# Patient Record
Sex: Female | Born: 1960 | Race: Black or African American | Hispanic: No | Smoking: Former smoker
Health system: Southern US, Community
[De-identification: ages and names within clinical notes are randomized; demographics above are authoritative.]

## PROBLEM LIST (undated history)

## (undated) DIAGNOSIS — J309 Allergic rhinitis, unspecified: Secondary | ICD-10-CM

## (undated) DIAGNOSIS — F32A Depression, unspecified: Secondary | ICD-10-CM

## (undated) DIAGNOSIS — E785 Hyperlipidemia, unspecified: Secondary | ICD-10-CM

## (undated) DIAGNOSIS — B9689 Other specified bacterial agents as the cause of diseases classified elsewhere: Secondary | ICD-10-CM

## (undated) DIAGNOSIS — I10 Essential (primary) hypertension: Secondary | ICD-10-CM

## (undated) DIAGNOSIS — F1210 Cannabis abuse, uncomplicated: Secondary | ICD-10-CM

## (undated) DIAGNOSIS — N760 Acute vaginitis: Secondary | ICD-10-CM

## (undated) DIAGNOSIS — F4312 Post-traumatic stress disorder, chronic: Secondary | ICD-10-CM

## (undated) DIAGNOSIS — K219 Gastro-esophageal reflux disease without esophagitis: Secondary | ICD-10-CM

## (undated) DIAGNOSIS — F1410 Cocaine abuse, uncomplicated: Secondary | ICD-10-CM

## (undated) DIAGNOSIS — F1010 Alcohol abuse, uncomplicated: Secondary | ICD-10-CM

## (undated) DIAGNOSIS — D259 Leiomyoma of uterus, unspecified: Secondary | ICD-10-CM

## (undated) DIAGNOSIS — F419 Anxiety disorder, unspecified: Secondary | ICD-10-CM

## (undated) DIAGNOSIS — F329 Major depressive disorder, single episode, unspecified: Secondary | ICD-10-CM

## (undated) HISTORY — PX: TYMPANOSTOMY TUBE PLACEMENT: SHX32

## (undated) HISTORY — DX: Post-traumatic stress disorder, chronic: F43.12

## (undated) HISTORY — DX: Gastro-esophageal reflux disease without esophagitis: K21.9

## (undated) HISTORY — DX: Allergic rhinitis, unspecified: J30.9

## (undated) HISTORY — DX: Hyperlipidemia, unspecified: E78.5

## (undated) HISTORY — DX: Other specified bacterial agents as the cause of diseases classified elsewhere: B96.89

## (undated) HISTORY — DX: Leiomyoma of uterus, unspecified: D25.9

## (undated) HISTORY — DX: Anxiety disorder, unspecified: F41.9

## (undated) HISTORY — DX: Major depressive disorder, single episode, unspecified: F32.9

## (undated) HISTORY — PX: CHOLECYSTECTOMY: SHX55

## (undated) HISTORY — PX: OTHER SURGICAL HISTORY: SHX169

## (undated) HISTORY — PX: TOE SURGERY: SHX1073

## (undated) HISTORY — DX: Depression, unspecified: F32.A

## (undated) HISTORY — DX: Acute vaginitis: N76.0

## (undated) HISTORY — PX: TONSILLECTOMY: SUR1361

## (undated) HISTORY — PX: ABDOMINAL HYSTERECTOMY: SHX81

---

## 2005-12-09 ENCOUNTER — Emergency Department (HOSPITAL_COMMUNITY): Admission: EM | Admit: 2005-12-09 | Discharge: 2005-12-09 | Payer: Self-pay | Admitting: Family Medicine

## 2006-12-06 ENCOUNTER — Emergency Department (HOSPITAL_COMMUNITY): Admission: EM | Admit: 2006-12-06 | Discharge: 2006-12-06 | Payer: Self-pay | Admitting: Emergency Medicine

## 2006-12-29 ENCOUNTER — Emergency Department (HOSPITAL_COMMUNITY): Admission: EM | Admit: 2006-12-29 | Discharge: 2006-12-29 | Payer: Self-pay | Admitting: Family Medicine

## 2007-07-18 ENCOUNTER — Encounter: Admission: RE | Admit: 2007-07-18 | Discharge: 2007-07-18 | Payer: Self-pay | Admitting: Family Medicine

## 2007-09-12 ENCOUNTER — Ambulatory Visit (HOSPITAL_COMMUNITY): Admission: RE | Admit: 2007-09-12 | Discharge: 2007-09-12 | Payer: Self-pay | Admitting: Obstetrics

## 2008-07-12 ENCOUNTER — Emergency Department (HOSPITAL_COMMUNITY): Admission: EM | Admit: 2008-07-12 | Discharge: 2008-07-12 | Payer: Self-pay | Admitting: Family Medicine

## 2008-10-26 ENCOUNTER — Emergency Department (HOSPITAL_COMMUNITY): Admission: EM | Admit: 2008-10-26 | Discharge: 2008-10-27 | Payer: Self-pay | Admitting: Emergency Medicine

## 2009-01-02 ENCOUNTER — Emergency Department (HOSPITAL_COMMUNITY): Admission: EM | Admit: 2009-01-02 | Discharge: 2009-01-02 | Payer: Self-pay | Admitting: Emergency Medicine

## 2009-01-23 ENCOUNTER — Other Ambulatory Visit: Payer: Self-pay

## 2009-01-23 ENCOUNTER — Other Ambulatory Visit: Payer: Self-pay | Admitting: Emergency Medicine

## 2009-01-24 ENCOUNTER — Inpatient Hospital Stay (HOSPITAL_COMMUNITY): Admission: RE | Admit: 2009-01-24 | Discharge: 2009-01-30 | Payer: Self-pay | Admitting: Psychiatry

## 2009-01-24 ENCOUNTER — Ambulatory Visit: Payer: Self-pay | Admitting: Psychiatry

## 2009-05-10 ENCOUNTER — Emergency Department (HOSPITAL_COMMUNITY): Admission: EM | Admit: 2009-05-10 | Discharge: 2009-05-10 | Payer: Self-pay | Admitting: Emergency Medicine

## 2009-05-30 ENCOUNTER — Emergency Department (HOSPITAL_COMMUNITY): Admission: EM | Admit: 2009-05-30 | Discharge: 2009-05-30 | Payer: Self-pay | Admitting: Emergency Medicine

## 2009-09-02 ENCOUNTER — Observation Stay (HOSPITAL_COMMUNITY): Admission: EM | Admit: 2009-09-02 | Discharge: 2009-09-03 | Payer: Self-pay | Admitting: Emergency Medicine

## 2010-03-22 ENCOUNTER — Encounter: Payer: Self-pay | Admitting: Family Medicine

## 2010-03-23 ENCOUNTER — Encounter: Payer: Self-pay | Admitting: Obstetrics

## 2010-04-07 ENCOUNTER — Other Ambulatory Visit (HOSPITAL_COMMUNITY): Payer: Self-pay | Admitting: Obstetrics

## 2010-04-07 DIAGNOSIS — Z1231 Encounter for screening mammogram for malignant neoplasm of breast: Secondary | ICD-10-CM

## 2010-04-07 DIAGNOSIS — Z9289 Personal history of other medical treatment: Secondary | ICD-10-CM

## 2010-04-09 LAB — PROCEDURE REPORT - SCANNED: Pap: NEGATIVE

## 2010-04-23 ENCOUNTER — Ambulatory Visit (HOSPITAL_COMMUNITY): Admission: RE | Admit: 2010-04-23 | Payer: Medicaid Other | Source: Ambulatory Visit

## 2010-05-04 ENCOUNTER — Encounter (HOSPITAL_COMMUNITY)
Admission: RE | Admit: 2010-05-04 | Discharge: 2010-05-04 | Disposition: A | Discharge status: Home / Self Care | Payer: Medicaid Other | Source: Ambulatory Visit | Attending: Obstetrics | Admitting: Obstetrics

## 2010-05-04 DIAGNOSIS — Z01818 Encounter for other preprocedural examination: Secondary | ICD-10-CM | POA: Insufficient documentation

## 2010-05-04 DIAGNOSIS — Z01812 Encounter for preprocedural laboratory examination: Secondary | ICD-10-CM | POA: Insufficient documentation

## 2010-05-04 LAB — BASIC METABOLIC PANEL
BUN: 12 mg/dL (ref 6–23)
Creatinine, Ser: 0.43 mg/dL (ref 0.4–1.2)
GFR calc Af Amer: >60 mL/min (ref >60)
GFR calc non Af Amer: >60 mL/min (ref >60)
Potassium: 4.1 mEq/L (ref 3.5–5.1)

## 2010-05-04 LAB — CBC
MCV: 80.2 fL (ref 78.0–100.0)
Platelets: 358 10*3/uL (ref 150–400)
RDW: 27.8 % — ABNORMAL HIGH (ref 11.5–15.5)
WBC: 9.3 10*3/uL (ref 4.0–10.5)

## 2010-05-04 LAB — SURGICAL PCR SCREEN
MRSA, PCR: NEGATIVE
Staphylococcus aureus: NEGATIVE

## 2010-05-06 ENCOUNTER — Other Ambulatory Visit: Payer: Self-pay | Admitting: Obstetrics

## 2010-05-06 ENCOUNTER — Inpatient Hospital Stay (HOSPITAL_COMMUNITY)
Admission: RE | Admit: 2010-05-06 | Discharge: 2010-05-09 | DRG: 743 | Disposition: A | Discharge status: Home / Self Care | Payer: Medicaid Other | Source: Ambulatory Visit | Attending: Obstetrics | Admitting: Obstetrics

## 2010-05-06 DIAGNOSIS — N841 Polyp of cervix uteri: Secondary | ICD-10-CM | POA: Diagnosis present

## 2010-05-06 DIAGNOSIS — D252 Subserosal leiomyoma of uterus: Principal | ICD-10-CM | POA: Diagnosis present

## 2010-05-06 DIAGNOSIS — N838 Other noninflammatory disorders of ovary, fallopian tube and broad ligament: Secondary | ICD-10-CM | POA: Diagnosis present

## 2010-05-07 LAB — CBC
HCT: 32.3 % — ABNORMAL LOW (ref 36.0–46.0)
Hemoglobin: 10.2 g/dL — ABNORMAL LOW (ref 12.0–15.0)
MCH: 24.3 pg — ABNORMAL LOW (ref 26.0–34.0)
MCHC: 31.6 g/dL (ref 30.0–36.0)
MCV: 77.1 fL — ABNORMAL LOW (ref 78.0–100.0)
Platelets: 302 10*3/uL (ref 150–400)
RBC: 4.19 MIL/uL (ref 3.87–5.11)
WBC: 14.9 10*3/uL — ABNORMAL HIGH (ref 4.0–10.5)

## 2010-05-13 NOTE — Discharge Summary (Signed)
  Tonya Mckenzie, Tonya Mckenzie                  ACCOUNT NO.:  [id number]  MEDICAL RECORD NO.:  [id number]           PATIENT TYPE:  I  LOCATION:  9308                          FACILITY:  [facility]  PHYSICIAN:  Kathreen Cosier, M.D.DATE OF BIRTH:  1960/05/21  DATE OF ADMISSION:  05/06/2010 DATE OF DISCHARGE:  05/09/2010                              DISCHARGE SUMMARY   She is a 50 year old, gravida 4, para 4 with long history of myoma uteri and was admitted for TAH.  She takes clonidine 0.15 for pressure and the patient has depression from the past 2 years, however, she has not been taking any medication.  She witnessed the death of her husband and since then has been in denial.  She has an appointment for mental health.  She underwent TAH and bilateral salpingectomy on April 07, 2010.  Postop, she did well.  Her hemoglobin on admission was 12.4, postop 10.2, sodium 142, potassium 4.1, chloride 106, glucose 97, BUN 12, RPR negative.  She was discharged on the third postoperative day, ambulatory, on a regular diet, to see me in 4 weeks.  DISCHARGE DIAGNOSIS:  Status post total abdominal hysterectomy, bilateral salpingectomy.  DISCHARGE MEDICATIONS:  Zoloft 100 mg p.o. daily and Tylox for pain.          ______________________________ Kathreen Cosier, M.D.     BAM/MEDQ  D:  05/09/2010  T:  05/09/2010  Job:  [id number]  Electronically Signed by Francoise Ceo M.D. on 05/13/2010 07:14:07 AM

## 2010-05-13 NOTE — Op Note (Signed)
  Tonya Mckenzie, Tonya Mckenzie                  ACCOUNT NO.:  [id number]  MEDICAL RECORD NO.:  [id number]           PATIENT TYPE:  I  LOCATION:  9308                          FACILITY:  [facility]  PHYSICIAN:  Kathreen Cosier, M.D.DATE OF BIRTH:  1960-04-23  DATE OF PROCEDURE:  05/06/2010 DATE OF DISCHARGE:                              OPERATIVE REPORT   PREOPERATIVE DIAGNOSIS:  Myoma uteri.  POSTOPERATIVE DIAGNOSIS:  Myoma uteri.  SURGEON:  Kathreen Cosier, MD  FIRST ASSISTANT:  Charles A. Clearance Coots, MD  PROCEDURE:  The patient was placed on the operating table in supine position and general anesthesia administered, abdomen prepped and draped, bladder emptied with Foley catheter.  Transverse suprapubic incision was made, carried down to rectus fascia.  Fascia cleaned and incised to the length of incision.  Recti muscles were retracted laterally.  Peritoneum incised longitudinally.  She had 18-week size uterus and the uterus was delivered through the incision.  Right round ligament was grasped with a Kelly clamp, cut, suture ligated with #1 chromic.  Procedure was done in a similar fashion on the other side. Using the Metzenbaum, bladder flap was developed and the bladder was dissected off the cervix and uterus.  The right utero-ovarian ligament was grasped with Kelly clamp, cut, and suture ligated with #1 chromic. Procedure was done in a similar fashion on the other side.  Uterine vessels were skeletonized bilaterally, double clamped with Heaney clamps on the right, cut, suture ligated x2 with #1 chromic.  Procedure was done in a similar fashion on the other side.  The right uterosacral and cardinal ligaments were grasped with straight Kocher clamp, cut, suture ligated with #1 chromic.  Procedure was done in a similar fashion on the other side.  Specimen consisting of uterus and cervix were removed with the cervicovaginal junction with Mayo scissors.  Modified Richardson sutures were  placed in the angles of the vagina and the vaginal vault run with interlocking suture of #1 chromic.  Hemostasis was satisfactory.  The left tube was grasped with Kelly clamp, cut, suture ligated with #1 chromic.  Procedure was done in a similar fashion on the other side.  The ovaries were normal.  Operative site reperitonealized with 2-0 chromic.  Lap and sponge counts were correct.  Blood loss 250 mL.  Abdomen closed in layers, peritoneum with continuous suture of 0 chromic, fascia with continuous suture with Dexon, skin closed with subcuticular stitch of 4-0 Monocryl.  The patient tolerated the procedure well, taken to recovery room in good condition.          ______________________________ Kathreen Cosier, M.D.     BAM/MEDQ  D:  05/06/2010  T:  05/06/2010  Job:  [id number]  Electronically Signed by Francoise Ceo M.D. on 05/13/2010 07:14:05 AM

## 2010-05-20 LAB — DIFFERENTIAL
Eosinophils Relative: 2 % (ref 0–5)
Monocytes Absolute: 0.7 10*3/uL (ref 0.1–1.0)
Neutrophils Relative %: 68 % (ref 43–77)

## 2010-05-20 LAB — CBC
HCT: 33.7 % — ABNORMAL LOW (ref 36.0–46.0)
Hemoglobin: 10.9 g/dL — ABNORMAL LOW (ref 12.0–15.0)
MCHC: 32.4 g/dL (ref 30.0–36.0)
MCV: 79.1 fL (ref 78.0–100.0)
RDW: 17.4 % — ABNORMAL HIGH (ref 11.5–15.5)

## 2010-05-20 LAB — URINALYSIS, ROUTINE W REFLEX MICROSCOPIC
Nitrite: NEGATIVE
Protein, ur: NEGATIVE mg/dL
Urobilinogen, UA: 0.2 mg/dL (ref 0.0–1.0)

## 2010-05-20 LAB — COMPREHENSIVE METABOLIC PANEL
Alkaline Phosphatase: 81 U/L (ref 39–117)
BUN: 9 mg/dL (ref 6–23)
Calcium: 9.1 mg/dL (ref 8.4–10.5)
Creatinine, Ser: 0.98 mg/dL (ref 0.4–1.2)
Glucose, Bld: 104 mg/dL — ABNORMAL HIGH (ref 70–99)
Potassium: 4.1 mEq/L (ref 3.5–5.1)
Total Protein: 6.8 g/dL (ref 6.0–8.3)

## 2010-05-20 LAB — WET PREP, GENITAL
Trich, Wet Prep: NONE SEEN
Yeast Wet Prep HPF POC: NONE SEEN

## 2010-05-20 LAB — URINE MICROSCOPIC-ADD ON

## 2010-05-20 LAB — GC/CHLAMYDIA PROBE AMP, GENITAL: GC Probe Amp, Genital: NEGATIVE

## 2010-05-25 LAB — CBC
Platelets: 364 10*3/uL (ref 150–400)
RDW: 16.8 % — ABNORMAL HIGH (ref 11.5–15.5)

## 2010-05-25 LAB — DIFFERENTIAL
Lymphocytes Relative: 18 % (ref 12–46)
Lymphs Abs: 1.8 10*3/uL (ref 0.7–4.0)
Monocytes Relative: 6 % (ref 3–12)
Neutro Abs: 7.4 10*3/uL (ref 1.7–7.7)
Neutrophils Relative %: 74 % (ref 43–77)

## 2010-05-25 LAB — RAPID URINE DRUG SCREEN, HOSP PERFORMED
Benzodiazepines: NOT DETECTED
Cocaine: POSITIVE — AB
Tetrahydrocannabinol: POSITIVE — AB

## 2010-05-25 LAB — BASIC METABOLIC PANEL
BUN: 11 mg/dL (ref 6–23)
Calcium: 8.8 mg/dL (ref 8.4–10.5)
Creatinine, Ser: 0.99 mg/dL (ref 0.4–1.2)
GFR calc non Af Amer: >60 mL/min (ref >60)
Glucose, Bld: 99 mg/dL (ref 70–99)

## 2010-05-25 LAB — PREGNANCY, URINE: Preg Test, Ur: NEGATIVE

## 2010-06-03 LAB — DIFFERENTIAL
Basophils Absolute: 0 10*3/uL (ref 0.0–0.1)
Basophils Absolute: 0.1 10*3/uL (ref 0.0–0.1)
Basophils Relative: 0 % (ref 0–1)
Eosinophils Absolute: 0.2 10*3/uL (ref 0.0–0.7)
Lymphs Abs: 2.8 10*3/uL (ref 0.7–4.0)
Monocytes Absolute: 0.7 10*3/uL (ref 0.1–1.0)
Neutro Abs: 6.9 10*3/uL (ref 1.7–7.7)
Neutrophils Relative %: 61 % (ref 43–77)

## 2010-06-03 LAB — URINALYSIS, ROUTINE W REFLEX MICROSCOPIC
Glucose, UA: NEGATIVE mg/dL
Ketones, ur: NEGATIVE mg/dL
Leukocytes, UA: NEGATIVE
Protein, ur: NEGATIVE mg/dL
Urobilinogen, UA: 0.2 mg/dL (ref 0.0–1.0)
pH: 6 (ref 5.0–8.0)

## 2010-06-03 LAB — CBC
HCT: 33.6 % — ABNORMAL LOW (ref 36.0–46.0)
Hemoglobin: 11 g/dL — ABNORMAL LOW (ref 12.0–15.0)
MCV: 85.7 fL (ref 78.0–100.0)
Platelets: 301 10*3/uL (ref 150–400)
Platelets: 304 10*3/uL (ref 150–400)
RBC: 4.01 MIL/uL (ref 3.87–5.11)
WBC: 10.3 10*3/uL (ref 4.0–10.5)
WBC: 10.4 10*3/uL (ref 4.0–10.5)

## 2010-06-03 LAB — COMPREHENSIVE METABOLIC PANEL
Albumin: 3.8 g/dL (ref 3.5–5.2)
Alkaline Phosphatase: 92 U/L (ref 39–117)
BUN: 16 mg/dL (ref 6–23)
CO2: 23 mEq/L (ref 19–32)
Chloride: 106 mEq/L (ref 96–112)
GFR calc non Af Amer: >60 mL/min (ref >60)
Glucose, Bld: 94 mg/dL (ref 70–99)
Potassium: 3.6 mEq/L (ref 3.5–5.1)
Total Bilirubin: 0.5 mg/dL (ref 0.3–1.2)

## 2010-06-03 LAB — URINE MICROSCOPIC-ADD ON

## 2010-06-03 LAB — BASIC METABOLIC PANEL
BUN: 16 mg/dL (ref 6–23)
Chloride: 109 mEq/L (ref 96–112)
Creatinine, Ser: 1.14 mg/dL (ref 0.4–1.2)
GFR calc non Af Amer: 51 mL/min — ABNORMAL LOW (ref >60)

## 2010-06-03 LAB — RAPID URINE DRUG SCREEN, HOSP PERFORMED
Amphetamines: NOT DETECTED
Barbiturates: NOT DETECTED
Cocaine: POSITIVE — AB
Tetrahydrocannabinol: POSITIVE — AB

## 2010-06-03 LAB — ETHANOL
Alcohol, Ethyl (B): <5 mg/dL (ref 0–10)
Alcohol, Ethyl (B): <5 mg/dL (ref 0–10)

## 2010-06-03 LAB — TRICYCLICS SCREEN, URINE: TCA Scrn: NOT DETECTED

## 2010-12-09 LAB — WET PREP, GENITAL: Yeast Wet Prep HPF POC: NONE SEEN

## 2010-12-09 LAB — POCT URINALYSIS DIP (DEVICE)
Bilirubin Urine: NEGATIVE
Nitrite: NEGATIVE
Protein, ur: NEGATIVE
Urobilinogen, UA: 0.2
pH: 6

## 2010-12-09 LAB — URINE CULTURE: Culture: NO GROWTH

## 2010-12-09 LAB — POCT PREGNANCY, URINE
Operator id: 239701
Preg Test, Ur: NEGATIVE

## 2010-12-10 LAB — URINE MICROSCOPIC-ADD ON

## 2010-12-10 LAB — URINALYSIS, ROUTINE W REFLEX MICROSCOPIC
Bilirubin Urine: NEGATIVE
Ketones, ur: NEGATIVE
Protein, ur: NEGATIVE
Specific Gravity, Urine: 1.028
Urobilinogen, UA: 0.2

## 2010-12-10 LAB — I-STAT 8, (EC8 V) (CONVERTED LAB)
Acid-Base Excess: 1
Chloride: 108
Glucose, Bld: 94
pCO2, Ven: 44.2 — ABNORMAL LOW
pH, Ven: 7.382 — ABNORMAL HIGH

## 2010-12-10 LAB — POCT PREGNANCY, URINE
Operator id: 285841
Preg Test, Ur: NEGATIVE

## 2010-12-10 LAB — RAPID URINE DRUG SCREEN, HOSP PERFORMED
Amphetamines: NOT DETECTED
Barbiturates: NOT DETECTED
Benzodiazepines: NOT DETECTED
Cocaine: POSITIVE — AB
Opiates: NOT DETECTED

## 2010-12-10 LAB — POCT I-STAT CREATININE
Creatinine, Ser: 1.1
Operator id: 285841

## 2010-12-10 LAB — ETHANOL: Alcohol, Ethyl (B): <5

## 2010-12-10 LAB — DIFFERENTIAL
Eosinophils Relative: 2
Lymphocytes Relative: 22
Lymphs Abs: 2

## 2010-12-10 LAB — CBC
HCT: 39.6
Platelets: 320
RBC: 4.48
WBC: 9

## 2011-01-07 ENCOUNTER — Ambulatory Visit (HOSPITAL_COMMUNITY): Payer: Medicaid Other

## 2011-02-10 ENCOUNTER — Ambulatory Visit (HOSPITAL_COMMUNITY): Payer: Medicaid Other

## 2011-02-24 ENCOUNTER — Ambulatory Visit (HOSPITAL_COMMUNITY): Payer: Medicaid Other | Attending: Obstetrics

## 2011-05-13 ENCOUNTER — Other Ambulatory Visit: Payer: Self-pay | Admitting: Internal Medicine

## 2011-05-13 DIAGNOSIS — N63 Unspecified lump in unspecified breast: Secondary | ICD-10-CM

## 2011-05-20 ENCOUNTER — Other Ambulatory Visit: Payer: Medicaid Other

## 2011-05-25 ENCOUNTER — Other Ambulatory Visit: Payer: Medicaid Other

## 2011-06-01 ENCOUNTER — Other Ambulatory Visit: Payer: Self-pay | Admitting: Internal Medicine

## 2011-06-01 DIAGNOSIS — Z1231 Encounter for screening mammogram for malignant neoplasm of breast: Secondary | ICD-10-CM

## 2011-06-08 ENCOUNTER — Ambulatory Visit: Payer: Medicaid Other

## 2011-06-15 ENCOUNTER — Ambulatory Visit
Admission: RE | Admit: 2011-06-15 | Discharge: 2011-06-15 | Disposition: A | Discharge status: Home / Self Care | Payer: Medicaid Other | Source: Ambulatory Visit | Attending: Internal Medicine | Admitting: Internal Medicine

## 2011-06-15 DIAGNOSIS — Z1231 Encounter for screening mammogram for malignant neoplasm of breast: Secondary | ICD-10-CM

## 2011-11-08 ENCOUNTER — Emergency Department (INDEPENDENT_AMBULATORY_CARE_PROVIDER_SITE_OTHER): Payer: Medicaid Other

## 2011-11-08 ENCOUNTER — Encounter (HOSPITAL_COMMUNITY): Payer: Self-pay | Admitting: *Deleted

## 2011-11-08 ENCOUNTER — Emergency Department (INDEPENDENT_AMBULATORY_CARE_PROVIDER_SITE_OTHER)
Admission: EM | Admit: 2011-11-08 | Discharge: 2011-11-08 | Disposition: A | Discharge status: Home / Self Care | Payer: Medicaid Other | Source: Home / Self Care | Attending: Family Medicine | Admitting: Family Medicine

## 2011-11-08 DIAGNOSIS — S93409A Sprain of unspecified ligament of unspecified ankle, initial encounter: Secondary | ICD-10-CM

## 2011-11-08 HISTORY — DX: Essential (primary) hypertension: I10

## 2011-11-08 MED ORDER — DICLOFENAC POTASSIUM 50 MG PO TABS: 50.0000 mg | ORAL_TABLET | Freq: Three times a day (TID) | ORAL | Status: DC

## 2011-11-08 NOTE — ED Provider Notes (Signed)
History     CSN: [id number]  Arrival date & time 11/08/11  1556   First MD Initiated Contact with Patient 11/08/11 1602      No chief complaint on file.   (Consider location/radiation/quality/duration/timing/severity/associated sxs/prior treatment) Patient is a 51 y.o. female presenting with ankle pain. The history is provided by the patient.  Ankle Pain  The incident occurred 2 days ago. The incident occurred at home. The injury mechanism was a fall (stepped on a rock and ankle rolled, no pain on sun, however worse today.). The pain is present in the left ankle. The quality of the pain is described as throbbing. The pain is mild. She reports no foreign bodies present.    No past medical history on file.  No past surgical history on file.  No family history on file.  History  Substance Use Topics  . Smoking status: Not on file  . Smokeless tobacco: Not on file  . Alcohol Use: Not on file    OB History    No data available      Review of Systems  Constitutional: Negative.   Gastrointestinal: Negative.   Musculoskeletal: Positive for joint swelling and gait problem.    Allergies  Review of patient's allergies indicates not on file.  Home Medications  No current outpatient prescriptions on file.  BP 147/102  Pulse 86  Temp 98.2 F (36.8 C) (Oral)  Resp 21  SpO2 100%  Physical Exam  Nursing note and vitals reviewed. Constitutional: She is oriented to person, place, and time. She appears well-developed and well-nourished.  Musculoskeletal: She exhibits tenderness.       Left ankle: She exhibits decreased range of motion and swelling. She exhibits no ecchymosis and no deformity. tenderness. Lateral malleolus, AITFL and CF ligament tenderness found. No medial malleolus, no posterior TFL, no head of 5th metatarsal and no proximal fibula tenderness found. Achilles tendon normal.  Neurological: She is alert and oriented to person, place, and time.  Skin: Skin is warm  and dry.  Psychiatric: She has a normal mood and affect.    ED Course  Procedures (including critical care time)  Labs Reviewed - No data to display No results found.   No diagnosis found.    MDM  X-rays reviewed and report per radiologist.       Linna Hoff, MD 11/12/11 [phone]

## 2011-11-08 NOTE — ED Notes (Signed)
Pt  Reports  Several  Days  Ago  She  Injured  Her  l  Foot/  Ankle  Area    She  Has  Been  Walking in the  Affected  Foot      And  Now  Has  Pain  And  Swelling  Worse  On  Weight bearing

## 2012-05-12 ENCOUNTER — Other Ambulatory Visit: Payer: Self-pay

## 2012-05-12 DIAGNOSIS — Z1231 Encounter for screening mammogram for malignant neoplasm of breast: Secondary | ICD-10-CM

## 2012-06-15 ENCOUNTER — Ambulatory Visit: Payer: Medicaid Other

## 2012-07-06 ENCOUNTER — Ambulatory Visit: Payer: Medicaid Other

## 2012-07-13 ENCOUNTER — Ambulatory Visit: Payer: Medicaid Other

## 2012-07-20 ENCOUNTER — Ambulatory Visit: Payer: Medicaid Other

## 2012-07-25 ENCOUNTER — Ambulatory Visit: Payer: Medicaid Other

## 2012-08-14 ENCOUNTER — Emergency Department (INDEPENDENT_AMBULATORY_CARE_PROVIDER_SITE_OTHER): Payer: Medicaid Other

## 2012-08-14 ENCOUNTER — Emergency Department (INDEPENDENT_AMBULATORY_CARE_PROVIDER_SITE_OTHER)
Admission: EM | Admit: 2012-08-14 | Discharge: 2012-08-14 | Disposition: A | Discharge status: Home / Self Care | Payer: Medicaid Other | Source: Home / Self Care | Attending: Emergency Medicine | Admitting: Emergency Medicine

## 2012-08-14 ENCOUNTER — Encounter (HOSPITAL_COMMUNITY): Payer: Self-pay

## 2012-08-14 DIAGNOSIS — S93409A Sprain of unspecified ligament of unspecified ankle, initial encounter: Secondary | ICD-10-CM

## 2012-08-14 DIAGNOSIS — S93401A Sprain of unspecified ligament of right ankle, initial encounter: Secondary | ICD-10-CM

## 2012-08-14 MED ORDER — HYDROCODONE-ACETAMINOPHEN 5-325 MG PO TABS: 1.0000 | ORAL_TABLET | Freq: Four times a day (QID) | ORAL | Status: DC | PRN

## 2012-08-14 NOTE — ED Notes (Signed)
States she misjudged the steps at home 3 weeks ago, fell, injured right foot/ankle . Has been dealing with it , but foot is not getting better

## 2012-08-14 NOTE — ED Provider Notes (Signed)
History     CSN: [id number]  Arrival date & time 08/14/12  1106   First MD Initiated Contact with Patient 08/14/12 1246      Chief Complaint  Patient presents with  . Foot Pain    (Consider location/radiation/quality/duration/timing/severity/associated sxs/prior treatment) HPI  52 yo bf presents with right foot and ankle pain.  States that about 3 weeks ago she missed the bottom 3 steps going down and suffered a inversion injury to her ankle.  She has tried to treat conservatively with med spec brace and otc pain medication without improvement.  Pain with weightbearing mostly med and lateral ankle and foot.  Has continued to have swelling.  No other injuries.    Past Medical History  Diagnosis Date  . Hypertension     Past Surgical History  Procedure Laterality Date  . Abdominal hysterectomy      History reviewed. No pertinent family history.  History  Substance Use Topics  . Smoking status: Not on file  . Smokeless tobacco: Not on file  . Alcohol Use:     OB History   Grav Para Term Preterm Abortions TAB SAB Ect Mult Living                  Review of Systems  Constitutional: Negative.   HENT: Negative.   Eyes: Negative.   Respiratory: Negative.   Cardiovascular: Negative.   Gastrointestinal: Negative.   Endocrine: Negative.   Genitourinary: Negative.   Musculoskeletal: Positive for joint swelling and gait problem.  Skin: Negative.   Neurological: Negative.   Psychiatric/Behavioral: Negative.     Allergies  Review of patient's allergies indicates no known allergies.  Home Medications   Current Outpatient Rx  Name  Route  Sig  Dispense  Refill  . cloNIDine (CATAPRES) 0.1 MG tablet   Oral   Take 0.1 mg by mouth 2 (two) times daily.         . pravastatin (PRAVACHOL) 40 MG tablet   Oral   Take 40 mg by mouth daily.         . diclofenac (CATAFLAM) 50 MG tablet   Oral   Take 1 tablet (50 mg total) by mouth 3 (three) times daily.   21 tablet  0   . HYDROcodone-acetaminophen (NORCO) 5-325 MG per tablet   Oral   Take 1 tablet by mouth every 6 (six) hours as needed for pain.   30 tablet   0     BP 152/96  Pulse 68  Temp(Src) 98.4 F (36.9 C) (Oral)  Resp 16  SpO2 97%  Physical Exam  Constitutional: She is oriented to person, place, and time. She appears well-developed and well-nourished.  HENT:  Head: Normocephalic and atraumatic.  Eyes: EOM are normal. Pupils are equal, round, and reactive to light.  Neck: Normal range of motion.  Cardiovascular: Normal rate and regular rhythm.   Pulmonary/Chest: Effort normal and breath sounds normal.  Abdominal: Soft. Bowel sounds are normal.  Musculoskeletal:  Gait antalgic.  Right ankle has decreased rom.  Mild to mod swelling mostly medial.  Marked ttp over deltoid and ATF ligaments.  Difficult to assess ligament stability due to pain.  NVI, skin warm and dry.    Neurological: She is alert and oriented to person, place, and time.  Skin: Skin is warm and dry.    ED Course  Procedures (including critical care time)  Labs Reviewed - No data to display Dg Ankle Complete Right  08/14/2012   *RADIOLOGY  REPORT*  Clinical Data: All down steps 3 weeks ago with persistent medial ankle and foot pain, soft tissue swelling is noted about the medial malleolus, limited range of motion  RIGHT ANKLE - COMPLETE 3+ VIEW  Comparison: The right foot radiographs - earlier same day  Findings:  There is mild soft tissue swelling about the ankle, likely worse adjacent to the medial malleolus, without associated fracture or dislocation.  Ankle mortise is preserved.  No ankle joint effusion. Small ankle plantar calcaneal spur.  No radiopaque foreign body.  IMPRESSION: Mild diffuse soft tissue swelling without associated fracture or dislocation.   Original Report Authenticated By: Tacey Ruiz, MD   Dg Foot Complete Right  08/14/2012   *RADIOLOGY REPORT*  Clinical Data: Foot pain post fall 3 weeks ago   RIGHT FOOT COMPLETE - 3+ VIEW  Comparison: None.  Findings: Three views of the right foot submitted.  No acute fracture or subluxation.  No radiopaque foreign body.  IMPRESSION: No acute fracture or subluxation.   Original Report Authenticated By: Natasha Mead, M.D.     1. Ankle sprain, right, initial encounter       MDM  Patient put in a cam walker today.  Can be weightbearing as tolerated in boot with crutches.  Will call to make appointment with dr Salvatore Marvel this week for recheck.  Can use otc anti-inflammatory.   Meds ordered this encounter  Medications  . HYDROcodone-acetaminophen (NORCO) 5-325 MG per tablet    Sig: Take 1 tablet by mouth every 6 (six) hours as needed for pain.    Dispense:  30 tablet    Refill:  0        Zonia Kief, PA-C 08/14/12 1355

## 2012-08-14 NOTE — ED Provider Notes (Signed)
Medical screening examination/treatment/procedure(s) were performed by non-physician practitioner and as supervising physician I was immediately available for consultation/collaboration.  Deny Chevez, M.D.  Antowan Samford C Conda Wannamaker, MD 08/14/12 2037 

## 2012-08-14 NOTE — ED Notes (Signed)
Discussed medication precautions; will remove cam walker for bathing and driving

## 2012-08-28 ENCOUNTER — Encounter (HOSPITAL_COMMUNITY): Payer: Self-pay | Admitting: *Deleted

## 2012-08-28 ENCOUNTER — Emergency Department (HOSPITAL_COMMUNITY)
Admission: EM | Admit: 2012-08-28 | Discharge: 2012-08-28 | Disposition: A | Discharge status: Home / Self Care | Payer: Medicaid Other | Attending: Emergency Medicine | Admitting: Emergency Medicine

## 2012-08-28 DIAGNOSIS — X58XXXA Exposure to other specified factors, initial encounter: Secondary | ICD-10-CM | POA: Insufficient documentation

## 2012-08-28 DIAGNOSIS — M25571 Pain in right ankle and joints of right foot: Secondary | ICD-10-CM

## 2012-08-28 DIAGNOSIS — I10 Essential (primary) hypertension: Secondary | ICD-10-CM | POA: Insufficient documentation

## 2012-08-28 DIAGNOSIS — Y929 Unspecified place or not applicable: Secondary | ICD-10-CM | POA: Insufficient documentation

## 2012-08-28 DIAGNOSIS — M25579 Pain in unspecified ankle and joints of unspecified foot: Secondary | ICD-10-CM | POA: Insufficient documentation

## 2012-08-28 DIAGNOSIS — IMO0002 Reserved for concepts with insufficient information to code with codable children: Secondary | ICD-10-CM | POA: Insufficient documentation

## 2012-08-28 DIAGNOSIS — Y939 Activity, unspecified: Secondary | ICD-10-CM | POA: Insufficient documentation

## 2012-08-28 MED ORDER — TRAMADOL HCL 50 MG PO TABS: 50.0000 mg | ORAL_TABLET | Freq: Four times a day (QID) | ORAL | Status: DC | PRN

## 2012-08-28 NOTE — ED Provider Notes (Signed)
Medical screening examination/treatment/procedure(s) were performed by non-physician practitioner and as supervising physician I was immediately available for consultation/collaboration.    Lilburn Straw R Aundrea Higginbotham, MD 08/28/12 1602 

## 2012-08-28 NOTE — ED Notes (Signed)
Pt states ankle was feeling better until yesterday when she worked and stood/walked all day. Ankle swollen

## 2012-08-28 NOTE — ED Provider Notes (Signed)
History    CSN: [id number] Arrival date & time 08/28/12  1041  First MD Initiated Contact with Patient 08/28/12 1047     Chief Complaint  Patient presents with  . Ankle Pain   (Consider location/radiation/quality/duration/timing/severity/associated sxs/prior Treatment) HPI Comments: Patient reports that she has had persistent pain of her right ankle since spraining her ankle approximately 5 weeks ago.  She was seen at Urgent Care on 08/14/12 and had an xray done of her ankle and foot that was negative.  She denies any new injuries since that time.  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  She has been taking Motrin for the pain without relief.  She has also been wearing an Ankle ASO and using a Cam Walker intermittently over the past couple of weeks without relief.    Patient is a 52 y.o. female presenting with ankle pain. The history is provided by the patient.  Ankle Pain Location:  Ankle Associated symptoms: no decreased ROM, no fever, no numbness, no stiffness, no swelling and no tingling    Past Medical History  Diagnosis Date  . Hypertension    Past Surgical History  Procedure Laterality Date  . Abdominal hysterectomy     History reviewed. No pertinent family history. History  Substance Use Topics  . Smoking status: Not on file  . Smokeless tobacco: Not on file  . Alcohol Use:    OB History   Grav Para Term Preterm Abortions TAB SAB Ect Mult Living                 Review of Systems  Constitutional: Negative for fever.  Musculoskeletal: Negative for stiffness.       Right ankle pain  All other systems reviewed and are negative.    Allergies  Review of patient's allergies indicates no known allergies.  Home Medications   Current Outpatient Rx  Name  Route  Sig  Dispense  Refill  . ibuprofen (ADVIL,MOTRIN) 200 MG tablet   Oral   Take 600 mg by mouth every 6 (six) hours as needed for  pain.          BP 127/72  Pulse 74  Temp(Src) 99 F (37.2 C) (Oral)  Resp 16  SpO2 98% Physical Exam  Nursing note and vitals reviewed. Constitutional: She appears well-developed and well-nourished.  HENT:  Head: Normocephalic and atraumatic.  Cardiovascular: Normal rate, regular rhythm and normal heart sounds.   Pulses:      Dorsalis pedis pulses are 2+ on the right side, and 2+ on the left side.  Pulmonary/Chest: Effort normal and breath sounds normal.  Musculoskeletal:       Right ankle: She exhibits normal range of motion, no swelling, no ecchymosis, no deformity and normal pulse. Tenderness. Medial malleolus tenderness found.  Neurological: She is alert. No sensory deficit.  Skin: Skin is warm and dry.  No erythema, edema, or warmth of the right ankle and foot.  Psychiatric: She has a normal mood and affect.    ED Course  Procedures (including critical care time) Labs Reviewed - No data to display No results found. No diagnosis found.  MDM  Patient presenting with persistent ankle pain that has been present since spraining her ankle 5 weeks ago.  Xray performed two weeks ago was negative.  No acute injury since that time.  No signs of infection.  Patient neurovascularly intact.  Patient  has ankle ASO and also cam walker already.  Patient given short course of Ultram and instructed to follow up with Orthopedist.  Magnus Sinning, PA-C 08/28/12 1145

## 2012-08-28 NOTE — ED Notes (Addendum)
Pt reports she stepped of the step and hurt her right ankle 3 weeks ago. Pain still persists. 10/10. Seen in ED 6/16 for same injury. Xray taken on 6/16, no fx reported

## 2012-08-31 ENCOUNTER — Ambulatory Visit: Payer: Medicaid Other

## 2012-09-06 ENCOUNTER — Ambulatory Visit: Payer: Medicaid Other

## 2012-09-29 ENCOUNTER — Ambulatory Visit: Payer: Medicaid Other

## 2012-10-16 ENCOUNTER — Ambulatory Visit: Payer: Medicaid Other

## 2012-12-14 ENCOUNTER — Ambulatory Visit: Payer: Medicaid Other

## 2012-12-21 ENCOUNTER — Ambulatory Visit: Payer: Medicaid Other

## 2013-01-10 ENCOUNTER — Ambulatory Visit: Payer: Medicaid Other

## 2013-03-09 ENCOUNTER — Ambulatory Visit: Payer: Medicaid Other

## 2013-04-09 ENCOUNTER — Ambulatory Visit: Payer: Medicaid Other

## 2013-04-17 ENCOUNTER — Inpatient Hospital Stay: Admission: RE | Admit: 2013-04-17 | Payer: Medicaid Other | Source: Ambulatory Visit

## 2013-05-02 ENCOUNTER — Ambulatory Visit: Payer: Medicaid Other

## 2013-06-22 ENCOUNTER — Other Ambulatory Visit: Payer: Self-pay

## 2013-06-22 DIAGNOSIS — Z1231 Encounter for screening mammogram for malignant neoplasm of breast: Secondary | ICD-10-CM

## 2013-07-06 ENCOUNTER — Ambulatory Visit: Payer: Medicaid Other

## 2013-07-31 ENCOUNTER — Ambulatory Visit
Admission: RE | Admit: 2013-07-31 | Discharge: 2013-07-31 | Disposition: A | Discharge status: Home / Self Care | Payer: Medicaid Other | Source: Ambulatory Visit

## 2013-07-31 ENCOUNTER — Encounter (INDEPENDENT_AMBULATORY_CARE_PROVIDER_SITE_OTHER): Payer: Self-pay

## 2013-07-31 DIAGNOSIS — Z1231 Encounter for screening mammogram for malignant neoplasm of breast: Secondary | ICD-10-CM

## 2013-08-17 ENCOUNTER — Emergency Department (HOSPITAL_COMMUNITY): Payer: No Typology Code available for payment source

## 2013-08-17 ENCOUNTER — Emergency Department (HOSPITAL_COMMUNITY)
Admission: EM | Admit: 2013-08-17 | Discharge: 2013-08-17 | Disposition: A | Discharge status: Home / Self Care | Payer: No Typology Code available for payment source | Attending: Emergency Medicine | Admitting: Emergency Medicine

## 2013-08-17 ENCOUNTER — Encounter (HOSPITAL_COMMUNITY): Payer: Self-pay | Admitting: Emergency Medicine

## 2013-08-17 DIAGNOSIS — S4980XA Other specified injuries of shoulder and upper arm, unspecified arm, initial encounter: Secondary | ICD-10-CM | POA: Insufficient documentation

## 2013-08-17 DIAGNOSIS — Y92410 Unspecified street and highway as the place of occurrence of the external cause: Secondary | ICD-10-CM | POA: Insufficient documentation

## 2013-08-17 DIAGNOSIS — IMO0002 Reserved for concepts with insufficient information to code with codable children: Secondary | ICD-10-CM | POA: Insufficient documentation

## 2013-08-17 DIAGNOSIS — S46909A Unspecified injury of unspecified muscle, fascia and tendon at shoulder and upper arm level, unspecified arm, initial encounter: Secondary | ICD-10-CM | POA: Insufficient documentation

## 2013-08-17 DIAGNOSIS — I10 Essential (primary) hypertension: Secondary | ICD-10-CM | POA: Insufficient documentation

## 2013-08-17 DIAGNOSIS — Y9389 Activity, other specified: Secondary | ICD-10-CM | POA: Insufficient documentation

## 2013-08-17 MED ORDER — IBUPROFEN 800 MG PO TABS: 800.0000 mg | ORAL_TABLET | Freq: Three times a day (TID) | ORAL | Status: DC

## 2013-08-17 MED ORDER — METHOCARBAMOL 500 MG PO TABS: 500.0000 mg | ORAL_TABLET | Freq: Two times a day (BID) | ORAL | Status: DC

## 2013-08-17 MED ORDER — TRAMADOL HCL 50 MG PO TABS
50.0000 mg | ORAL_TABLET | Freq: Once | ORAL | Status: AC
  Administered 2013-08-17: 50 mg via ORAL
  Filled 2013-08-17: qty 1
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
## 2013-08-17 NOTE — ED Provider Notes (Signed)
History/physical exam/procedure(s) were performed by non-physician practitioner and as supervising physician I was immediately available for consultation/collaboration. I have reviewed all notes and am in agreement with care and plan.   Shaune Pollack, MD 08/17/13 2330
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
## 2013-08-17 NOTE — ED Notes (Signed)
Pt has a ride home.  

## 2013-08-29 ENCOUNTER — Encounter: Payer: Self-pay | Admitting: Gastroenterology

## 2013-09-25 ENCOUNTER — Emergency Department (HOSPITAL_COMMUNITY)
Admission: EM | Admit: 2013-09-25 | Discharge: 2013-09-25 | Discharge status: Absent without leave | Payer: Medicaid Other | Source: Home / Self Care

## 2013-09-25 NOTE — ED Notes (Signed)
This pt was called in all waiting areas - no response from this pt.

## 2013-09-25 NOTE — ED Notes (Signed)
Pt was called in all waiting areas - no response from this pt. Registration staff state that they do not see the pt in the waiting areas.

## 2013-09-25 NOTE — ED Notes (Signed)
No response from this pt after being called in all waiting areas x 3.

## 2013-10-22 ENCOUNTER — Encounter: Payer: Medicaid Other | Admitting: Gastroenterology

## 2013-11-15 ENCOUNTER — Ambulatory Visit (AMBULATORY_SURGERY_CENTER): Payer: Self-pay | Admitting: *Deleted

## 2013-11-15 VITALS — Ht 66.0 in | Wt 174.2 lb

## 2013-11-15 DIAGNOSIS — Z1211 Encounter for screening for malignant neoplasm of colon: Secondary | ICD-10-CM

## 2013-11-15 MED ORDER — NA SULFATE-K SULFATE-MG SULF 17.5-3.13-1.6 GM/177ML PO SOLN: 1.0000 | Freq: Once | ORAL | Status: DC

## 2013-11-15 NOTE — Progress Notes (Signed)
No egg or soy allergy. ewm No home 02 use. emw No diet pills ewm No previous colon per pt. emw No problems with past sedation. emw

## 2013-11-28 ENCOUNTER — Telehealth: Payer: Self-pay | Admitting: Gastroenterology

## 2013-11-28 ENCOUNTER — Encounter: Payer: Medicaid Other | Admitting: Gastroenterology

## 2013-11-28 NOTE — Telephone Encounter (Signed)
No charge. 

## 2013-12-20 ENCOUNTER — Encounter (HOSPITAL_COMMUNITY): Payer: Self-pay | Admitting: Emergency Medicine

## 2013-12-20 ENCOUNTER — Other Ambulatory Visit (HOSPITAL_COMMUNITY)
Admission: RE | Admit: 2013-12-20 | Discharge: 2013-12-20 | Disposition: A | Discharge status: Home / Self Care | Payer: Medicaid Other | Source: Ambulatory Visit | Attending: Family Medicine | Admitting: Family Medicine

## 2013-12-20 ENCOUNTER — Emergency Department (INDEPENDENT_AMBULATORY_CARE_PROVIDER_SITE_OTHER)
Admission: EM | Admit: 2013-12-20 | Discharge: 2013-12-20 | Disposition: A | Discharge status: Home / Self Care | Payer: Medicaid Other | Source: Home / Self Care | Attending: Family Medicine | Admitting: Family Medicine

## 2013-12-20 DIAGNOSIS — N760 Acute vaginitis: Secondary | ICD-10-CM | POA: Insufficient documentation

## 2013-12-20 DIAGNOSIS — Z113 Encounter for screening for infections with a predominantly sexual mode of transmission: Secondary | ICD-10-CM | POA: Insufficient documentation

## 2013-12-20 LAB — POCT URINALYSIS DIP (DEVICE)
Bilirubin Urine: NEGATIVE
Glucose, UA: NEGATIVE mg/dL
HGB URINE DIPSTICK: NEGATIVE
Ketones, ur: 15 mg/dL — AB
Leukocytes, UA: NEGATIVE
Nitrite: NEGATIVE
PROTEIN: NEGATIVE mg/dL
SPECIFIC GRAVITY, URINE: 1.025 (ref 1.005–1.030)
UROBILINOGEN UA: 0.2 mg/dL (ref 0.0–1.0)
pH: 5.5 (ref 5.0–8.0)

## 2013-12-20 LAB — CERVICOVAGINAL ANCILLARY ONLY
Wet Prep (BD Affirm): NEGATIVE
Wet Prep (BD Affirm): NEGATIVE
Wet Prep (BD Affirm): POSITIVE — AB

## 2013-12-20 LAB — POCT PREGNANCY, URINE: Preg Test, Ur: NEGATIVE

## 2013-12-20 MED ORDER — METRONIDAZOLE 500 MG PO TABS: 500.0000 mg | ORAL_TABLET | Freq: Two times a day (BID) | ORAL | Status: DC

## 2013-12-20 NOTE — ED Notes (Signed)
Reports having unprotected intercourse 3 days ago.  Pt is c/o dysuria.  Vaginal discharge.  Vaginal irritaton.  Denies fever, pelvic/abdominal pain.

## 2013-12-20 NOTE — Discharge Instructions (Signed)
Thank you for coming in today. I will call you if anything comes up with the labs.  If your belly pain worsens, or you have high fever, bad vomiting, blood in your stool or black tarry stool go to the Emergency Room.    Bacterial Vaginosis Bacterial vaginosis is a vaginal infection that occurs when the normal balance of bacteria in the vagina is disrupted. It results from an overgrowth of certain bacteria. This is the most common vaginal infection in women of childbearing age. Treatment is important to prevent complications, especially in pregnant women, as it can cause a premature delivery. CAUSES  Bacterial vaginosis is caused by an increase in harmful bacteria that are normally present in smaller amounts in the vagina. Several different kinds of bacteria can cause bacterial vaginosis. However, the reason that the condition develops is not fully understood. RISK FACTORS Certain activities or behaviors can put you at an increased risk of developing bacterial vaginosis, including:  Having a new sex partner or multiple sex partners.  Douching.  Using an intrauterine device (IUD) for contraception. Women do not get bacterial vaginosis from toilet seats, bedding, swimming pools, or contact with objects around them. SIGNS AND SYMPTOMS  Some women with bacterial vaginosis have no signs or symptoms. Common symptoms include:  Grey vaginal discharge.  A fishlike odor with discharge, especially after sexual intercourse.  Itching or burning of the vagina and vulva.  Burning or pain with urination. DIAGNOSIS  Your health care provider will take a medical history and examine the vagina for signs of bacterial vaginosis. A sample of vaginal fluid may be taken. Your health care provider will look at this sample under a microscope to check for bacteria and abnormal cells. A vaginal pH test may also be done.  TREATMENT  Bacterial vaginosis may be treated with antibiotic medicines. These may be given in  the form of a pill or a vaginal cream. A second round of antibiotics may be prescribed if the condition comes back after treatment.  HOME CARE INSTRUCTIONS   Only take over-the-counter or prescription medicines as directed by your health care provider.  If antibiotic medicine was prescribed, take it as directed. Make sure you finish it even if you start to feel better.  Do not have sex until treatment is completed.  Tell all sexual partners that you have a vaginal infection. They should see their health care provider and be treated if they have problems, such as a mild rash or itching.  Practice safe sex by using condoms and only having one sex partner. SEEK MEDICAL CARE IF:   Your symptoms are not improving after 3 days of treatment.  You have increased discharge or pain.  You have a fever. MAKE SURE YOU:   Understand these instructions.  Will watch your condition.  Will get help right away if you are not doing well or get worse. FOR MORE INFORMATION  Centers for Disease Control and Prevention, Division of STD Prevention: [employer]raiser[employer]raud[employer] American Sexual Health Association (ASHA): www.ashastd.org  Document Released: 02/15/2005 Document Revised: 12/06/2012 Document Reviewed: 09/27/2012 [facility] Patient Information 2015 [place], [place]. This information is not intended to replace advice given to you by your health care provider. Make sure you discuss any questions you have with your health care provider.

## 2013-12-20 NOTE — ED Provider Notes (Signed)
Tonya Mckenzie is a 53 y.o. female who presents to Urgent Care today for vaginal discharge and pain. Patient had sex 3 days ago however discovered a broken condom. Since then she's noted vaginal discharge as well as some irritation. No significant urinary symptoms. No abdominal pain.  Past Surgical History  Procedure Laterality Date  . Abdominal hysterectomy    . Cholecystectomy    . Tympanostomy tube placement    . Tonsillectomy      Past Medical History  Diagnosis Date  . Hyperlipidemia   . Hypertension     off meds due to weight loss   History  Substance Use Topics  . Smoking status: Never Smoker   . Smokeless tobacco: Never Used  . Alcohol Use: Yes     Comment: socially   ROS as above Medications: No current facility-administered medications for this encounter.   Current Outpatient Prescriptions  Medication Sig Dispense Refill  . cloNIDine (CATAPRES) 0.1 MG tablet Take 0.1 mg by mouth daily.      . pravastatin (PRAVACHOL) 40 MG tablet Take 40 mg by mouth daily.      Marland Kitchen ibuprofen (ADVIL,MOTRIN) 800 MG tablet Take 1 tablet (800 mg total) by mouth 3 (three) times daily.  21 tablet  0  . metroNIDAZOLE (FLAGYL) 500 MG tablet Take 1 tablet (500 mg total) by mouth 2 (two) times daily.  14 tablet  0  . Na Sulfate-K Sulfate-Mg Sulf (SUPREP BOWEL PREP) SOLN Take 1 kit by mouth once. suprep as directed. No substitutions  354 mL  0    Exam:  BP 149/98  Pulse 74  Temp(Src) 98.2 F (36.8 C) (Oral)  Resp 14  SpO2 98% Gen: Well NAD GYN: Normal external genitalia. Vaginal canal with thin white discharge. Normal-appearing vaginal cuff. Nontender.  Results for orders placed during the hospital encounter of 12/20/13 (from the past 24 hour(s))  POCT URINALYSIS DIP (DEVICE)     Status: Abnormal   Collection Time    12/20/13  1:14 PM      Result Value Ref Range   Glucose, UA NEGATIVE  NEGATIVE mg/dL   Bilirubin Urine NEGATIVE  NEGATIVE   Ketones, ur 15 (*) NEGATIVE mg/dL   Specific  Gravity, Urine 1.025  1.005 - 1.030   Hgb urine dipstick NEGATIVE  NEGATIVE   pH 5.5  5.0 - 8.0   Protein, ur NEGATIVE  NEGATIVE mg/dL   Urobilinogen, UA 0.2  0.0 - 1.0 mg/dL   Nitrite NEGATIVE  NEGATIVE   Leukocytes, UA NEGATIVE  NEGATIVE  POCT PREGNANCY, URINE     Status: None   Collection Time    12/20/13  1:23 PM      Result Value Ref Range   Preg Test, Ur NEGATIVE  NEGATIVE   No results found.  Assessment and Plan: 53 y.o. female with vaginitis likely BV. Cytology pending. HIV and RPR ordered the patient was discharged prior to the labs being obtained. We've attempted to contact patient to have her come back to the clinic to obtain these labs.  Discussed warning signs or symptoms. Please see discharge instructions. Patient expresses understanding.     Gregor Hams, MD 12/20/13 [phone]

## 2013-12-21 LAB — CERVICOVAGINAL ANCILLARY ONLY
Chlamydia: NEGATIVE
NEISSERIA GONORRHEA: NEGATIVE

## 2013-12-25 ENCOUNTER — Telehealth: Payer: Self-pay | Admitting: Gastroenterology

## 2013-12-25 NOTE — ED Notes (Signed)
GC/Chlamydia neg., Affirm: Candida and Trich neg.,  Gardnerella pos. Pt. adequately treated with Flagyl. Roselyn Meier 12/25/2013

## 2013-12-25 NOTE — ED Notes (Addendum)
Pt. called for her lab results.  I called and left a message to call.  Call 1. Tonya Mckenzie 12/25/2013 Pt. called back.  Pt. verified x 2 and given results. Pt. told she was adequately treated with Flagyl for bacterial vaginosis.  She asked if Flagyl could cause a yeast infection. I said rarely but if she does develop one to use OTC Monistat. Pt. voiced understanding. 12/25/2013

## 2013-12-25 NOTE — Telephone Encounter (Signed)
no

## 2013-12-26 ENCOUNTER — Encounter: Payer: Medicaid Other | Admitting: Gastroenterology

## 2014-04-25 ENCOUNTER — Ambulatory Visit (AMBULATORY_SURGERY_CENTER): Payer: Self-pay

## 2014-04-25 VITALS — Ht 66.0 in | Wt 186.0 lb

## 2014-04-25 DIAGNOSIS — Z1211 Encounter for screening for malignant neoplasm of colon: Secondary | ICD-10-CM

## 2014-04-25 MED ORDER — SUPREP BOWEL PREP KIT 17.5-3.13-1.6 GM/177ML PO SOLN: 1.0000 | Freq: Once | ORAL | Status: DC

## 2014-04-25 NOTE — Progress Notes (Signed)
No allergies to eggs or soy No diet/weight loss meds No past problems with anesthesia No home oxgyen  No internet

## 2014-05-09 ENCOUNTER — Encounter: Payer: Self-pay | Admitting: Gastroenterology

## 2014-05-09 ENCOUNTER — Ambulatory Visit (AMBULATORY_SURGERY_CENTER): Payer: Medicaid Other | Admitting: Gastroenterology

## 2014-05-09 VITALS — BP 141/98 | HR 63 | Temp 96.0°F | Resp 19 | Ht 66.0 in | Wt 186.0 lb

## 2014-05-09 DIAGNOSIS — Z1211 Encounter for screening for malignant neoplasm of colon: Secondary | ICD-10-CM | POA: Diagnosis not present

## 2014-05-09 MED ORDER — SODIUM CHLORIDE 0.9 % IV SOLN: 500.0000 mL | INTRAVENOUS | Status: DC

## 2014-05-09 NOTE — Patient Instructions (Signed)
Discharge instructions given. Normal exam. Resume previous medications. YOU HAD AN ENDOSCOPIC PROCEDURE TODAY AT THE [facility]:   Refer to the procedure report that was given to you for any specific questions about what was found during the examination.  If the procedure report does not answer your questions, please call your gastroenterologist to clarify.  If you requested that your care partner not be given the details of your procedure findings, then the procedure report has been included in a sealed envelope for you to review at your convenience later.  YOU SHOULD EXPECT: Some feelings of bloating in the abdomen. Passage of more gas than usual.  Walking can help get rid of the air that was put into your GI tract during the procedure and reduce the bloating. If you had a lower endoscopy (such as a colonoscopy or flexible sigmoidoscopy) you may notice spotting of blood in your stool or on the toilet paper. If you underwent a bowel prep for your procedure, you may not have a normal bowel movement for a few days.  Please Note:  You might notice some irritation and congestion in your nose or some drainage.  This is from the oxygen used during your procedure.  There is no need for concern and it should clear up in a day or so.  SYMPTOMS TO REPORT IMMEDIATELY:   Following lower endoscopy (colonoscopy or flexible sigmoidoscopy):  Excessive amounts of blood in the stool  Significant tenderness or worsening of abdominal pains  Swelling of the abdomen that is new, acute  Fever of 100F or higher   For urgent or emergent issues, a gastroenterologist can be reached at any hour by calling [phone].   DIET: Your first meal following the procedure should be a small meal and then it is ok to progress to your normal diet. Heavy or fried foods are harder to digest and may make you feel nauseous or bloated.  Likewise, meals heavy in dairy and vegetables can increase bloating.  Drink plenty  of fluids but you should avoid alcoholic beverages for 24 hours.  ACTIVITY:  You should plan to take it easy for the rest of today and you should NOT DRIVE or use heavy machinery until tomorrow (because of the sedation medicines used during the test).    FOLLOW UP: Our staff will call the number listed on your records the next business day following your procedure to check on you and address any questions or concerns that you may have regarding the information given to you following your procedure. If we do not reach you, we will leave a message.  However, if you are feeling well and you are not experiencing any problems, there is no need to return our call.  We will assume that you have returned to your regular daily activities without incident.  If any biopsies were taken you will be contacted by phone or by letter within the next 1-3 weeks.  Please call us at [phone] if you have not heard about the biopsies in 3 weeks.    SIGNATURES/CONFIDENTIALITY: You and/or your care partner have signed paperwork which will be entered into your electronic medical record.  These signatures attest to the fact that that the information above on your After Visit Summary has been reviewed and is understood.  Full responsibility of the confidentiality of this discharge information lies with you and/or your care-partner. 

## 2014-05-09 NOTE — Progress Notes (Signed)
Reported to Charleston Ropes, CRN and Dr. Erskine Emery that pt ate 6 bites of mashed potatoes and 1 taco last night at 18:00.  Will proceed with colonoscopy.  Pt notified. maw

## 2014-05-09 NOTE — Op Note (Signed)
[facility]  [address] [place], [id number]   COLONOSCOPY PROCEDURE REPORT  PATIENT: [name], [name]  MR#: [id number] BIRTHDATE: [birth year] , 57  yrs. old GENDER: female ENDOSCOPIST: Inda Castle, MD REFERRED [id number] Jeanie Cooks, M.D. PROCEDURE DATE:  05/09/2014 PROCEDURE:   Colonoscopy, diagnostic and Colonoscopy, screening First Screening Colonoscopy - Avg.  risk and is 50 yrs.  old or older Yes.  Prior Negative Screening - Now for repeat screening. N/A  History of Adenoma - Now for follow-up colonoscopy & has been > or = to 3 yrs.  N/A ASA CLASS:   Class II INDICATIONS:Colorectal Neoplasm Risk Assessment for this procedure is average risk. MEDICATIONS: Monitored anesthesia care, Propofol 400 mg IV, and Lidocaine 40 mg IV  DESCRIPTION OF PROCEDURE:   After the risks benefits and alternatives of the procedure were thoroughly explained, informed consent was obtained.  The digital rectal exam revealed no abnormalities of the rectum.   The LB [id number]  endoscope was introduced through the anus and advanced to the cecum, which was identified by both the appendix and ileocecal valve. No adverse events experienced.   The quality of the prep was  (Suprep was used) excellent.  The instrument was then slowly withdrawn as the colon was fully examined.      COLON FINDINGS: A normal appearing cecum, ileocecal valve, and appendiceal orifice were identified.  The ascending, transverse, descending, sigmoid colon, and rectum appeared unremarkable. Retroflexed views revealed no abnormalities. The time to cecum = 3.2 Withdrawal time = 7.9   The scope was withdrawn and the procedure completed. COMPLICATIONS: There were no immediate complications.  ENDOSCOPIC IMPRESSION: Normal colonoscopy  RECOMMENDATIONS: Continue current colorectal screening recommendations for "routine risk" patients with a repeat colonoscopy in 10 years.  eSigned:  Inda Castle, MD  05/09/2014 12:04 PM   cc:   PATIENT NAME:  Tonya Mckenzie, Tonya Mckenzie MR#: [id number]

## 2014-05-09 NOTE — Progress Notes (Signed)
Stable to RR 

## 2014-05-10 ENCOUNTER — Telehealth: Payer: Self-pay | Admitting: *Deleted

## 2014-05-10 NOTE — Telephone Encounter (Signed)
Left message on f/u callback 

## 2014-07-05 ENCOUNTER — Other Ambulatory Visit: Payer: Self-pay

## 2014-07-05 DIAGNOSIS — Z1231 Encounter for screening mammogram for malignant neoplasm of breast: Secondary | ICD-10-CM

## 2014-07-30 ENCOUNTER — Emergency Department (HOSPITAL_COMMUNITY)
Admission: EM | Admit: 2014-07-30 | Discharge: 2014-07-30 | Disposition: A | Discharge status: Home / Self Care | Payer: Medicaid Other | Source: Home / Self Care

## 2014-08-05 ENCOUNTER — Ambulatory Visit: Payer: Medicaid Other

## 2014-10-18 ENCOUNTER — Ambulatory Visit
Admission: RE | Admit: 2014-10-18 | Discharge: 2014-10-18 | Disposition: A | Discharge status: Home / Self Care | Payer: Medicaid Other | Source: Ambulatory Visit

## 2014-10-18 DIAGNOSIS — Z1231 Encounter for screening mammogram for malignant neoplasm of breast: Secondary | ICD-10-CM

## 2015-02-18 ENCOUNTER — Ambulatory Visit: Payer: Medicaid Other | Admitting: Podiatry

## 2015-03-04 ENCOUNTER — Encounter: Payer: Medicaid Other | Admitting: Podiatry

## 2015-03-04 ENCOUNTER — Ambulatory Visit: Payer: Self-pay

## 2015-03-07 ENCOUNTER — Emergency Department (HOSPITAL_BASED_OUTPATIENT_CLINIC_OR_DEPARTMENT_OTHER)
Admission: EM | Admit: 2015-03-07 | Discharge: 2015-03-07 | Disposition: A | Discharge status: Home / Self Care | Payer: Medicaid Other | Attending: Emergency Medicine | Admitting: Emergency Medicine

## 2015-03-07 ENCOUNTER — Encounter (HOSPITAL_BASED_OUTPATIENT_CLINIC_OR_DEPARTMENT_OTHER): Payer: Self-pay | Admitting: *Deleted

## 2015-03-07 DIAGNOSIS — K5900 Constipation, unspecified: Secondary | ICD-10-CM | POA: Insufficient documentation

## 2015-03-07 DIAGNOSIS — F329 Major depressive disorder, single episode, unspecified: Secondary | ICD-10-CM | POA: Diagnosis not present

## 2015-03-07 DIAGNOSIS — N760 Acute vaginitis: Secondary | ICD-10-CM | POA: Diagnosis not present

## 2015-03-07 DIAGNOSIS — F17210 Nicotine dependence, cigarettes, uncomplicated: Secondary | ICD-10-CM | POA: Insufficient documentation

## 2015-03-07 DIAGNOSIS — E785 Hyperlipidemia, unspecified: Secondary | ICD-10-CM | POA: Insufficient documentation

## 2015-03-07 DIAGNOSIS — Z791 Long term (current) use of non-steroidal anti-inflammatories (NSAID): Secondary | ICD-10-CM | POA: Insufficient documentation

## 2015-03-07 DIAGNOSIS — Z79899 Other long term (current) drug therapy: Secondary | ICD-10-CM | POA: Insufficient documentation

## 2015-03-07 DIAGNOSIS — B9689 Other specified bacterial agents as the cause of diseases classified elsewhere: Secondary | ICD-10-CM

## 2015-03-07 DIAGNOSIS — R1010 Upper abdominal pain, unspecified: Secondary | ICD-10-CM | POA: Diagnosis present

## 2015-03-07 DIAGNOSIS — I10 Essential (primary) hypertension: Secondary | ICD-10-CM | POA: Insufficient documentation

## 2015-03-07 LAB — CBC WITH DIFFERENTIAL/PLATELET
Basophils Absolute: 0 10*3/uL (ref 0.0–0.1)
Basophils Relative: 0 %
Eosinophils Absolute: 0.1 10*3/uL (ref 0.0–0.7)
Eosinophils Relative: 1 %
HEMATOCRIT: 42.9 % (ref 36.0–46.0)
HEMOGLOBIN: 13.7 g/dL (ref 12.0–15.0)
LYMPHS ABS: 1.8 10*3/uL (ref 0.7–4.0)
Lymphocytes Relative: 19 %
MCH: 28.3 pg (ref 26.0–34.0)
MCHC: 31.9 g/dL (ref 30.0–36.0)
MCV: 88.6 fL (ref 78.0–100.0)
MONOS PCT: 9 %
Monocytes Absolute: 0.8 10*3/uL (ref 0.1–1.0)
NEUTROS ABS: 6.6 10*3/uL (ref 1.7–7.7)
NEUTROS PCT: 71 %
Platelets: 234 10*3/uL (ref 150–400)
RBC: 4.84 MIL/uL (ref 3.87–5.11)
RDW: 13.7 % (ref 11.5–15.5)
WBC: 9.3 10*3/uL (ref 4.0–10.5)

## 2015-03-07 LAB — URINALYSIS, ROUTINE W REFLEX MICROSCOPIC
Bilirubin Urine: NEGATIVE
GLUCOSE, UA: 100 mg/dL — AB
HGB URINE DIPSTICK: NEGATIVE
KETONES UR: NEGATIVE mg/dL
Leukocytes, UA: NEGATIVE
Nitrite: NEGATIVE
Protein, ur: NEGATIVE mg/dL
Specific Gravity, Urine: 1.022 (ref 1.005–1.030)
pH: 7 (ref 5.0–8.0)

## 2015-03-07 LAB — COMPREHENSIVE METABOLIC PANEL
ALBUMIN: 3.6 g/dL (ref 3.5–5.0)
ALK PHOS: 81 U/L (ref 38–126)
ALT: 35 U/L (ref 14–54)
AST: 29 U/L (ref 15–41)
Anion gap: 4 — ABNORMAL LOW (ref 5–15)
BUN: 11 mg/dL (ref 6–20)
CHLORIDE: 107 mmol/L (ref 101–111)
CO2: 26 mmol/L (ref 22–32)
CREATININE: 0.81 mg/dL (ref 0.44–1.00)
Calcium: 8.4 mg/dL — ABNORMAL LOW (ref 8.9–10.3)
GFR calc non Af Amer: >60 mL/min (ref >60)
GLUCOSE: 103 mg/dL — AB (ref 65–99)
Potassium: 3.7 mmol/L (ref 3.5–5.1)
Sodium: 137 mmol/L (ref 135–145)
Total Bilirubin: 0.5 mg/dL (ref 0.3–1.2)
Total Protein: 6.5 g/dL (ref 6.5–8.1)

## 2015-03-07 LAB — WET PREP, GENITAL
Sperm: NONE SEEN
Trich, Wet Prep: NONE SEEN
Yeast Wet Prep HPF POC: NONE SEEN

## 2015-03-07 LAB — LIPASE, BLOOD: Lipase: 29 U/L (ref 11–51)

## 2015-03-07 MED ORDER — FAMOTIDINE 20 MG PO TABS: 20.0000 mg | ORAL_TABLET | Freq: Two times a day (BID) | ORAL | Status: DC

## 2015-03-07 MED ORDER — METRONIDAZOLE 500 MG PO TABS: 500.0000 mg | ORAL_TABLET | Freq: Two times a day (BID) | ORAL | Status: DC

## 2015-03-07 MED ORDER — DOCUSATE SODIUM 250 MG PO CAPS: 250.0000 mg | ORAL_CAPSULE | Freq: Two times a day (BID) | ORAL | Status: DC

## 2015-03-07 NOTE — ED Notes (Signed)
C/o pain in mid and upper abd that started 2 weeks ago. Also has low pelvic pain. C/o vaginal discharge that is white x 1 week. C/o low abd pain is worse after emptying bladder

## 2015-03-07 NOTE — ED Provider Notes (Signed)
CSN: [id number]     Arrival date & time 03/07/15  1043 History   First MD Initiated Contact with Patient 03/07/15 1106     Chief complaint: Abdominal pain  HPI She presents to emergency room with complaints of upper abdominal pain that started about 2 weeks ago. Sharp pain that comes and goes. She is also noticed some discomfort in her lower abdominal area above the bladder. It is also sharp. She's noticed some whitish vaginal discharge that started about a week ago. Denies any fevers or chills. She had one episode of nausea and vomiting last evening. None today. She has been constipated for the last 4 days. She has not tried any medications. No chest pain or shortness of breath. Past Medical History  Diagnosis Date  . Hyperlipidemia   . Hypertension     off meds due to weight loss  . Depression    Past Surgical History  Procedure Laterality Date  . Abdominal hysterectomy    . Cholecystectomy    . Tympanostomy tube placement    . Tonsillectomy     Family History  Problem Relation Age of Onset  . Colon cancer Neg Hx   . Esophageal cancer Neg Hx   . Rectal cancer Neg Hx   . Stomach cancer Neg Hx    Social History  Substance Use Topics  . Smoking status: Light Tobacco Smoker    Types: Cigarettes  . Smokeless tobacco: Never Used  . Alcohol Use: Yes     Comment: socially   OB History    No data available     Review of Systems  All other systems reviewed and are negative.     Allergies  Review of patient's allergies indicates no known allergies.  Home Medications   Prior to Admission medications   Medication Sig Start Date End Date Taking? Authorizing Provider  cloNIDine (CATAPRES) 0.1 MG tablet Take 0.1 mg by mouth daily.   Yes Historical Provider, MD  dexlansoprazole (DEXILANT) 60 MG capsule Take 60 mg by mouth daily.   Yes Historical Provider, MD  meloxicam (MOBIC) 15 MG tablet Take 15 mg by mouth daily.   Yes Historical Provider, MD  pravastatin (PRAVACHOL) 40 MG  tablet Take 40 mg by mouth daily.   Yes Historical Provider, MD  traZODone (DESYREL) 100 MG tablet Take 100 mg by mouth at bedtime.   Yes Historical Provider, MD  docusate sodium (COLACE) 250 MG capsule Take 1 capsule (250 mg total) by mouth 2 (two) times daily. For constipation 03/07/15   Dorie Rank, MD  famotidine (PEPCID) 20 MG tablet Take 1 tablet (20 mg total) by mouth 2 (two) times daily. 03/07/15   Dorie Rank, MD  metroNIDAZOLE (FLAGYL) 500 MG tablet Take 1 tablet (500 mg total) by mouth 2 (two) times daily. 03/07/15   Dorie Rank, MD   BP 158/98 mmHg  Pulse 68  Temp(Src) 97.8 F (36.6 C) (Oral)  Resp 18  Ht 5\' 6"  (1.676 m)  Wt 77.111 kg  BMI 27.45 kg/m2  SpO2 99% Physical Exam  Constitutional: She appears well-developed and well-nourished. No distress.  HENT:  Head: Normocephalic and atraumatic.  Right Ear: External ear normal.  Left Ear: External ear normal.  Eyes: Conjunctivae are normal. Right eye exhibits no discharge. Left eye exhibits no discharge. No scleral icterus.  Neck: Neck supple. No tracheal deviation present.  Cardiovascular: Normal rate, regular rhythm and intact distal pulses.   Pulmonary/Chest: Effort normal and breath sounds normal. No stridor. No respiratory  distress. She has no wheezes. She has no rales.  Abdominal: Soft. Bowel sounds are normal. She exhibits no distension. There is tenderness in the epigastric area and suprapubic area. There is no rebound and no guarding.  Genitourinary: Right adnexum displays no mass and no tenderness. Left adnexum displays no mass and no tenderness. Vaginal discharge found.  Status post hysterectomy, absent uterus  Musculoskeletal: She exhibits no edema or tenderness.  Neurological: She is alert. She has normal strength. No cranial nerve deficit (no facial droop, extraocular movements intact, no slurred speech) or sensory deficit. She exhibits normal muscle tone. She displays no seizure activity. Coordination normal.  Skin: Skin is  warm and dry. No rash noted.  Psychiatric: She has a normal mood and affect.  Nursing note and vitals reviewed.   ED Course  Procedures (including critical care time) Labs Review Labs Reviewed  WET PREP, GENITAL - Abnormal; Notable for the following:    Clue Cells Wet Prep HPF POC PRESENT (*)    WBC, Wet Prep HPF POC MODERATE (*)    All other components within normal limits  URINALYSIS, ROUTINE W REFLEX MICROSCOPIC (NOT AT [facility]) - Abnormal; Notable for the following:    Glucose, UA 100 (*)    All other components within normal limits  COMPREHENSIVE METABOLIC PANEL - Abnormal; Notable for the following:    Glucose, Bld 103 (*)    Calcium 8.4 (*)    Anion gap 4 (*)    All other components within normal limits  CBC WITH DIFFERENTIAL/PLATELET  LIPASE, BLOOD  RPR  HIV ANTIBODY (ROUTINE TESTING)  GC/CHLAMYDIA PROBE AMP ([place]) NOT AT [facility]     MDM   Final diagnoses:  Constipation, unspecified constipation type  Bacterial vaginosis    The patient has complaints of upper and lower abdominal discomfort. Her abdominal exam is benign. She has no significant tenderness on exam. Laboratory tests are reassuring. Possible symptoms could be related to the constipation or possibly gastritis. I will have her try taking Pepcid and Colace.  Patient did have vaginal discharge. I will give her prescription for Flagyl.  At this time there does not appear to be any evidence of an acute emergency medical condition and the patient appears stable for discharge with appropriate outpatient follow up.    Dorie Rank, MD 03/07/15 1311

## 2015-03-07 NOTE — Discharge Instructions (Signed)
Bacterial Vaginosis Bacterial vaginosis is an infection of the vagina. It happens when too many germs (bacteria) grow in the vagina. Having this infection puts you at risk for getting other infections from sex. Treating this infection can help lower your risk for other infections, such as:   Chlamydia.  Gonorrhea.  HIV.  Herpes. HOME CARE  Take your medicine as told by your doctor.  Finish your medicine even if you start to feel better.  Tell your sex partner that you have an infection. They should see their doctor for treatment.  During treatment:  Avoid sex or use condoms correctly.  Do not douche.  Do not drink alcohol unless your doctor tells you it is ok.  Do not breastfeed unless your doctor tells you it is ok. GET HELP IF:  You are not getting better after 3 days of treatment.  You have more grey fluid (discharge) coming from your vagina than before.  You have more pain than before.  You have a fever. MAKE SURE YOU:   Understand these instructions.  Will watch your condition.  Will get help right away if you are not doing well or get worse.   This information is not intended to replace advice given to you by your health care provider. Make sure you discuss any questions you have with your health care provider.   Document Released: 11/25/2007 Document Revised: 03/08/2014 Document Reviewed: 09/27/2012 Elsevier Interactive Patient Education 2016 [employer]. Constipation, Adult Constipation is when a person has fewer than three bowel movements a week, has difficulty having a bowel movement, or has stools that are dry, hard, or larger than normal. As people grow older, constipation is more common. A low-fiber diet, not taking in enough fluids, and taking certain medicines may make constipation worse.  CAUSES   Certain medicines, such as antidepressants, pain medicine, iron supplements, antacids, and water pills.   Certain diseases, such as diabetes,  irritable bowel syndrome (IBS), thyroid disease, or depression.   Not drinking enough water.   Not eating enough fiber-rich foods.   Stress or travel.   Lack of physical activity or exercise.   Ignoring the urge to have a bowel movement.   Using laxatives too much.  SIGNS AND SYMPTOMS   Having fewer than three bowel movements a week.   Straining to have a bowel movement.   Having stools that are hard, dry, or larger than normal.   Feeling full or bloated.   Pain in the lower abdomen.   Not feeling relief after having a bowel movement.  DIAGNOSIS  Your health care provider will take a medical history and perform a physical exam. Further testing may be done for severe constipation. Some tests may include:  A barium enema X-ray to examine your rectum, colon, and, sometimes, your small intestine.   A sigmoidoscopy to examine your lower colon.   A colonoscopy to examine your entire colon. TREATMENT  Treatment will depend on the severity of your constipation and what is causing it. Some dietary treatments include drinking more fluids and eating more fiber-rich foods. Lifestyle treatments may include regular exercise. If these diet and lifestyle recommendations do not help, your health care provider may recommend taking over-the-counter laxative medicines to help you have bowel movements. Prescription medicines may be prescribed if over-the-counter medicines do not work.  HOME CARE INSTRUCTIONS   Eat foods that have a lot of fiber, such as fruits, vegetables, whole grains, and beans.  Limit foods high in fat and processed  sugars, such as french fries, hamburgers, cookies, candies, and soda.   A fiber supplement may be added to your diet if you cannot get enough fiber from foods.   Drink enough fluids to keep your urine clear or pale yellow.   Exercise regularly or as directed by your health care provider.   Go to the restroom when you have the urge to go.  Do not hold it.   Only take over-the-counter or prescription medicines as directed by your health care provider. Do not take other medicines for constipation without talking to your health care provider first.  [place] IF:   You have bright red blood in your stool.   Your constipation lasts for more than 4 days or gets worse.   You have abdominal or rectal pain.   You have thin, pencil-like stools.   You have unexplained weight loss. MAKE SURE YOU:   Understand these instructions.  Will watch your condition.  Will get help right away if you are not doing well or get worse.   This information is not intended to replace advice given to you by your health care provider. Make sure you discuss any questions you have with your health care provider.   Document Released: 11/14/2003 Document Revised: 03/08/2014 Document Reviewed: 11/27/2012 Elsevier Interactive Patient Education [employer].

## 2015-03-08 LAB — RPR: RPR Ser Ql: NONREACTIVE

## 2015-03-08 LAB — HIV ANTIBODY (ROUTINE TESTING W REFLEX): HIV SCREEN 4TH GENERATION: NONREACTIVE

## 2015-03-10 LAB — GC/CHLAMYDIA PROBE AMP (~~LOC~~) NOT AT ARMC
Chlamydia: NEGATIVE
NEISSERIA GONORRHEA: NEGATIVE

## 2015-03-24 ENCOUNTER — Emergency Department (HOSPITAL_BASED_OUTPATIENT_CLINIC_OR_DEPARTMENT_OTHER)
Admission: EM | Admit: 2015-03-24 | Discharge: 2015-03-24 | Disposition: A | Discharge status: Home / Self Care | Payer: Medicaid Other | Attending: Emergency Medicine | Admitting: Emergency Medicine

## 2015-03-24 ENCOUNTER — Encounter (HOSPITAL_BASED_OUTPATIENT_CLINIC_OR_DEPARTMENT_OTHER): Payer: Self-pay

## 2015-03-24 DIAGNOSIS — I10 Essential (primary) hypertension: Secondary | ICD-10-CM | POA: Diagnosis not present

## 2015-03-24 DIAGNOSIS — F329 Major depressive disorder, single episode, unspecified: Secondary | ICD-10-CM | POA: Diagnosis not present

## 2015-03-24 DIAGNOSIS — IMO0001 Reserved for inherently not codable concepts without codable children: Secondary | ICD-10-CM

## 2015-03-24 DIAGNOSIS — E785 Hyperlipidemia, unspecified: Secondary | ICD-10-CM | POA: Insufficient documentation

## 2015-03-24 DIAGNOSIS — Z791 Long term (current) use of non-steroidal anti-inflammatories (NSAID): Secondary | ICD-10-CM | POA: Diagnosis not present

## 2015-03-24 DIAGNOSIS — Z79899 Other long term (current) drug therapy: Secondary | ICD-10-CM | POA: Insufficient documentation

## 2015-03-24 DIAGNOSIS — Z87891 Personal history of nicotine dependence: Secondary | ICD-10-CM | POA: Insufficient documentation

## 2015-03-24 DIAGNOSIS — R030 Elevated blood-pressure reading, without diagnosis of hypertension: Secondary | ICD-10-CM

## 2015-03-24 HISTORY — DX: Cocaine abuse, uncomplicated: F14.10

## 2015-03-24 HISTORY — DX: Alcohol abuse, uncomplicated: F10.10

## 2015-03-24 HISTORY — DX: Cannabis abuse, uncomplicated: F12.10

## 2015-03-24 NOTE — ED Notes (Signed)
C/o elevated BP-HA and lightheaded x 1 week-NAD-steady gait-pt at [facility] x 3 weeks for ETOH, marijuana and cocaine abuse

## 2015-03-24 NOTE — Discharge Instructions (Signed)
Please follow-up with your doctor in the next couple of days for reevaluation and medication management for your high blood pressure. Return to ED for any new or worsening symptoms as we discussed.  Hypertension Hypertension, commonly called high blood pressure, is when the force of blood pumping through your arteries is too strong. Your arteries are the blood vessels that carry blood from your heart throughout your body. A blood pressure reading consists of a higher number over a lower number, such as 110/72. The higher number (systolic) is the pressure inside your arteries when your heart pumps. The lower number (diastolic) is the pressure inside your arteries when your heart relaxes. Ideally you want your blood pressure below 120/80. Hypertension forces your heart to work harder to pump blood. Your arteries may become narrow or stiff. Having untreated or uncontrolled hypertension can cause heart attack, stroke, kidney disease, and other problems. RISK FACTORS Some risk factors for high blood pressure are controllable. Others are not.  Risk factors you cannot control include:   Race. You may be at higher risk if you are African American.  Age. Risk increases with age.  Gender. Men are at higher risk than women before age 67 years. After age 54, women are at higher risk than men. Risk factors you can control include:  Not getting enough exercise or physical activity.  Being overweight.  Getting too much fat, sugar, calories, or salt in your diet.  Drinking too much alcohol. SIGNS AND SYMPTOMS Hypertension does not usually cause signs or symptoms. Extremely high blood pressure (hypertensive crisis) may cause headache, anxiety, shortness of breath, and nosebleed. DIAGNOSIS To check if you have hypertension, your health care provider will measure your blood pressure while you are seated, with your arm held at the level of your heart. It should be measured at least twice using the same arm.  Certain conditions can cause a difference in blood pressure between your right and left arms. A blood pressure reading that is higher than normal on one occasion does not mean that you need treatment. If it is not clear whether you have high blood pressure, you may be asked to return on a different day to have your blood pressure checked again. Or, you may be asked to monitor your blood pressure at home for 1 or more weeks. TREATMENT Treating high blood pressure includes making lifestyle changes and possibly taking medicine. Living a healthy lifestyle can help lower high blood pressure. You may need to change some of your habits. Lifestyle changes may include:  Following the DASH diet. This diet is high in fruits, vegetables, and whole grains. It is low in salt, red meat, and added sugars.  Keep your sodium intake below 2,300 mg per day.  Getting at least 30-45 minutes of aerobic exercise at least 4 times per week.  Losing weight if necessary.  Not smoking.  Limiting alcoholic beverages.  Learning ways to reduce stress. Your health care provider may prescribe medicine if lifestyle changes are not enough to get your blood pressure under control, and if one of the following is true:  You are 33-80 years of age and your systolic blood pressure is above 140.  You are 31 years of age or older, and your systolic blood pressure is above 150.  Your diastolic blood pressure is above 90.  You have diabetes, and your systolic blood pressure is over [id number] or your diastolic blood pressure is over 90.  You have kidney disease and your blood pressure is  above 140/90.  You have heart disease and your blood pressure is above 140/90. Your personal target blood pressure may vary depending on your medical conditions, your age, and other factors. HOME CARE INSTRUCTIONS  Have your blood pressure rechecked as directed by your health care provider.   Take medicines only as directed by your health care  provider. Follow the directions carefully. Blood pressure medicines must be taken as prescribed. The medicine does not work as well when you skip doses. Skipping doses also puts you at risk for problems.  Do not smoke.   Monitor your blood pressure at home as directed by your health care provider. SEEK MEDICAL CARE IF:   You think you are having a reaction to medicines taken.  You have recurrent headaches or feel dizzy.  You have swelling in your ankles.  You have trouble with your vision. SEEK IMMEDIATE MEDICAL CARE IF:  You develop a severe headache or confusion.  You have unusual weakness, numbness, or feel faint.  You have severe chest or abdominal pain.  You vomit repeatedly.  You have trouble breathing. MAKE SURE YOU:   Understand these instructions.  Will watch your condition.  Will get help right away if you are not doing well or get worse.   This information is not intended to replace advice given to you by your health care provider. Make sure you discuss any questions you have with your health care provider.   Document Released: 02/15/2005 Document Revised: 07/02/2014 Document Reviewed: 12/08/2012 Elsevier Interactive Patient Education 2016 [place] DASH stands for "Dietary Approaches to Stop Hypertension." The DASH eating plan is a healthy eating plan that has been shown to reduce high blood pressure (hypertension). Additional health benefits may include reducing the risk of type 2 diabetes mellitus, heart disease, and stroke. The DASH eating plan may also help with weight loss. WHAT DO I NEED TO KNOW ABOUT THE DASH EATING PLAN? For the DASH eating plan, you will follow these general guidelines:  Choose foods with a percent daily value for sodium of less than 5% (as listed on the food label).  Use salt-free seasonings or herbs instead of table salt or sea salt.  Check with your health care provider or pharmacist before using salt  substitutes.  Eat lower-sodium products, often labeled as "lower sodium" or "no salt added."  Eat fresh foods.  Eat more vegetables, fruits, and low-fat dairy products.  Choose whole grains. Look for the word "whole" as the first word in the ingredient list.  Choose fish and skinless chicken or [place] more often than red meat. Limit fish, poultry, and meat to 6 oz (170 g) each day.  Limit sweets, desserts, sugars, and sugary drinks.  Choose heart-healthy fats.  Limit cheese to 1 oz (28 g) per day.  Eat more home-cooked food and less restaurant, buffet, and fast food.  Limit fried foods.  Cook foods using methods other than frying.  Limit canned vegetables. If you do use them, rinse them well to decrease the sodium.  When eating at a restaurant, ask that your food be prepared with less salt, or no salt if possible. WHAT FOODS CAN I EAT? Seek help from a dietitian for individual calorie needs. Grains Whole grain or whole wheat bread. Brown rice. Whole grain or whole wheat pasta. Quinoa, bulgur, and whole grain cereals. Low-sodium cereals. Corn or whole wheat flour tortillas. Whole grain cornbread. Whole grain crackers. Low-sodium crackers. Vegetables Fresh or frozen vegetables (raw, steamed,  roasted, or grilled). Low-sodium or reduced-sodium tomato and vegetable juices. Low-sodium or reduced-sodium tomato sauce and paste. Low-sodium or reduced-sodium canned vegetables.  Fruits All fresh, canned (in natural juice), or frozen fruits. Meat and Other Protein Products Ground beef (85% or leaner), grass-fed beef, or beef trimmed of fat. Skinless chicken or [place]. Ground chicken or [place]. Pork trimmed of fat. All fish and seafood. Eggs. Dried beans, peas, or lentils. Unsalted nuts and seeds. Unsalted canned beans. Dairy Low-fat dairy products, such as skim or 1% milk, 2% or reduced-fat cheeses, low-fat ricotta or cottage cheese, or plain low-fat yogurt. Low-sodium or reduced-sodium  cheeses. Fats and Oils Tub margarines without trans fats. Light or reduced-fat mayonnaise and salad dressings (reduced sodium). Avocado. Safflower, olive, or canola oils. Natural peanut or almond butter. Other Unsalted popcorn and pretzels. The items listed above may not be a complete list of recommended foods or beverages. Contact your dietitian for more options. WHAT FOODS ARE NOT RECOMMENDED? Grains White bread. White pasta. White rice. Refined cornbread. Bagels and croissants. Crackers that contain trans fat. Vegetables Creamed or fried vegetables. Vegetables in a cheese sauce. Regular canned vegetables. Regular canned tomato sauce and paste. Regular tomato and vegetable juices. Fruits Dried fruits. Canned fruit in light or heavy syrup. Fruit juice. Meat and Other Protein Products Fatty cuts of meat. Ribs, chicken wings, bacon, sausage, bologna, salami, chitterlings, fatback, hot dogs, bratwurst, and packaged luncheon meats. Salted nuts and seeds. Canned beans with salt. Dairy Whole or 2% milk, cream, half-and-half, and cream cheese. Whole-fat or sweetened yogurt. Full-fat cheeses or blue cheese. Nondairy creamers and whipped toppings. Processed cheese, cheese spreads, or cheese curds. Condiments Onion and garlic salt, seasoned salt, table salt, and sea salt. Canned and packaged gravies. Worcestershire sauce. Tartar sauce. Barbecue sauce. Teriyaki sauce. Soy sauce, including reduced sodium. Steak sauce. Fish sauce. Oyster sauce. Cocktail sauce. Horseradish. Ketchup and mustard. Meat flavorings and tenderizers. Bouillon cubes. Hot sauce. Tabasco sauce. Marinades. Taco seasonings. Relishes. Fats and Oils Butter, stick margarine, lard, shortening, ghee, and bacon fat. Coconut, palm kernel, or palm oils. Regular salad dressings. Other Pickles and olives. Salted popcorn and pretzels. The items listed above may not be a complete list of foods and beverages to avoid. Contact your dietitian for  more information. WHERE CAN I FIND MORE INFORMATION? National Heart, Lung, and Blood Institute: travelstabloid.com   This information is not intended to replace advice given to you by your health care provider. Make sure you discuss any questions you have with your health care provider.   Document Released: 02/04/2011 Document Revised: 03/08/2014 Document Reviewed: 12/20/2012 Elsevier Interactive Patient Education [employer].

## 2015-03-24 NOTE — ED Provider Notes (Signed)
CSN: [id number]     Arrival date & time 03/24/15  1052 History   First MD Initiated Contact with Patient 03/24/15 1126     Chief Complaint  Patient presents with  . Hypertension     (Consider location/radiation/quality/duration/timing/severity/associated sxs/prior Treatment) HPI Tonya Mckenzie is a 55 y.o. female with a history of polysubstance abuse, alcohol abuse, hypertension, comes in for evaluation of high blood pressure. Patient reports over the past 2 weeks she has noticed that her blood pressure has been elevated. She reports taking clonidine at home for high blood pressure. She reports going to her primary care doctor today at the walk-in clinic, but was unable to be seen so came to the emergency department. She reports intermittent "light headaches" over the past 2 weeks, most recently last night and lasted approximately 2 minutes and resolved on its own without intervention. She denies any chest pain, shortness of breath, numbness or weakness, nausea or vomiting, abdominal pain, changes in urinary habits, changes in bowel habits. Nothing makes the problem better or worse. No other modifying factors.  Past Medical History  Diagnosis Date  . Hyperlipidemia   . Hypertension     off meds due to weight loss  . Depression   . ETOH abuse   . Cocaine abuse   . Marijuana abuse    Past Surgical History  Procedure Laterality Date  . Abdominal hysterectomy    . Cholecystectomy    . Tympanostomy tube placement    . Tonsillectomy     Family History  Problem Relation Age of Onset  . Colon cancer Neg Hx   . Esophageal cancer Neg Hx   . Rectal cancer Neg Hx   . Stomach cancer Neg Hx    Social History  Substance Use Topics  . Smoking status: Former Research scientist (life sciences)  . Smokeless tobacco: Never Used  . Alcohol Use: No     Comment: in rehab   OB History    No data available     Review of Systems A 10 point review of systems was completed and was negative except for pertinent positives and  negatives as mentioned in the history of present illness     Allergies  Review of patient's allergies indicates no known allergies.  Home Medications   Prior to Admission medications   Medication Sig Start Date End Date Taking? Authorizing Provider  cloNIDine (CATAPRES) 0.1 MG tablet Take 0.1 mg by mouth daily.    Historical Provider, MD  dexlansoprazole (DEXILANT) 60 MG capsule Take 60 mg by mouth daily.    Historical Provider, MD  docusate sodium (COLACE) 250 MG capsule Take 1 capsule (250 mg total) by mouth 2 (two) times daily. For constipation 03/07/15   Dorie Rank, MD  famotidine (PEPCID) 20 MG tablet Take 1 tablet (20 mg total) by mouth 2 (two) times daily. 03/07/15   Dorie Rank, MD  meloxicam (MOBIC) 15 MG tablet Take 15 mg by mouth daily.    Historical Provider, MD  pravastatin (PRAVACHOL) 40 MG tablet Take 40 mg by mouth daily.    Historical Provider, MD  traZODone (DESYREL) 100 MG tablet Take 100 mg by mouth at bedtime.    Historical Provider, MD   BP 143/98 mmHg  Pulse 66  Temp(Src) 98.3 F (36.8 C) (Oral)  Resp 20  Ht 5\' 6"  (1.676 m)  Wt 80.74 kg  BMI 28.74 kg/m2  SpO2 100% Physical Exam  Constitutional: She is oriented to person, place, and time. She appears well-developed and well-nourished.  Overall well-appearing African American female in no apparent distress.  HENT:  Head: Normocephalic and atraumatic.  Mouth/Throat: Oropharynx is clear and moist.  Eyes: Conjunctivae are normal. Pupils are equal, round, and reactive to light. Right eye exhibits no discharge. Left eye exhibits no discharge. No scleral icterus.  Neck: Neck supple.  Cardiovascular: Normal rate, regular rhythm and normal heart sounds.   Pulmonary/Chest: Effort normal and breath sounds normal. No respiratory distress. She has no wheezes. She has no rales.  Abdominal: Soft. There is no tenderness.  Musculoskeletal: She exhibits no tenderness.  Neurological: She is alert and oriented to person, place, and  time.  Cranial Nerves II-XII grossly intact  Skin: Skin is warm and dry. No rash noted.  Psychiatric: She has a normal mood and affect.  Nursing note and vitals reviewed.   ED Course  Procedures (including critical care time) Labs Review Labs Reviewed - No data to display  Imaging Review No results found. I have personally reviewed and evaluated these images and lab results as part of my medical decision-making.   EKG Interpretation None     Filed Vitals:   03/24/15 1102 03/24/15 1201  BP: 149/101 143/98  Pulse: 64 66  Temp: 98.3 F (36.8 C)   TempSrc: Oral   Resp: 16 20  Height: 5\' 6"  (1.676 m)   Weight: 80.74 kg   SpO2: 100% 100%    MDM  RAJANI NORVILLE is a 55 y.o. female with history of polysubstance abuse, alcohol abuse, hypertension on clonidine therapy, comes in for evaluation of elevated blood pressure. Denies any symptoms concerning for hypertensive emergency. Is followed by PCP and will be able to follow-up for medication management. Physical exam is unremarkable today. Patient states she feels well. She remains hemodynamically stable, afebrile and appropriate for discharge and outpatient follow-up. Final diagnoses:  Elevated blood pressure        Comer Locket, PA-C 03/24/15 [place], DO 03/24/15 1457

## 2015-04-10 ENCOUNTER — Ambulatory Visit: Payer: Medicaid Other | Admitting: Podiatry

## 2015-04-11 ENCOUNTER — Ambulatory Visit: Payer: Medicaid Other | Admitting: Podiatry

## 2015-04-24 ENCOUNTER — Encounter: Payer: Self-pay | Admitting: Podiatry

## 2015-04-24 ENCOUNTER — Ambulatory Visit (INDEPENDENT_AMBULATORY_CARE_PROVIDER_SITE_OTHER): Payer: Medicaid Other

## 2015-04-24 ENCOUNTER — Ambulatory Visit (INDEPENDENT_AMBULATORY_CARE_PROVIDER_SITE_OTHER): Payer: Medicaid Other | Admitting: Podiatry

## 2015-04-24 VITALS — BP 111/69 | HR 62 | Resp 16

## 2015-04-24 DIAGNOSIS — M205X2 Other deformities of toe(s) (acquired), left foot: Secondary | ICD-10-CM

## 2015-04-24 MED ORDER — MELOXICAM 15 MG PO TABS: 15.0000 mg | ORAL_TABLET | Freq: Every day | ORAL | Status: DC

## 2015-04-24 NOTE — Progress Notes (Signed)
   Subjective:    Patient ID: Tonya Mckenzie, female    DOB: 1961/03/01, 55 y.o.   MRN: [id number]  HPI: She presents today with worsening pain to the first metatarsophalangeal joint of the left foot. She states that is starting to affect her ability to perform her daily activities and keep her weight loss under control. She denies trauma to it initially but does relate an injury possibly 2 years back they could have resulted in her problems. She also relates that she is depressed and on medication for this secondary to the loss of her husband and 2009. She also states that she is a recovering alcoholic and requests no narcotics.  She has been treated for pain of the first metatarsophalangeal joint of the left foot with meloxicam 7.5 mg 1 by mouth daily. She states that this helps some been not enough. She would like to consider surgical intervention if warranted.    Review of Systems  Eyes: Positive for visual disturbance.  Musculoskeletal: Positive for arthralgias and gait problem.  All other systems reviewed and are negative.      Objective:   Physical Exam: Vital signs are stable she is alert and oriented 3. Pulses are strongly palpable. Neurologic sensorium is intact per Semmes-Weinstein monofilament. Deep tendon reflexes are intact muscle strength +5/5 dorsiflexors plantar flexors and inverters and everters all intrinsic musculature is intact. Orthopedic evaluation demonstrates mild pes planus bilateral she does have very flexible hallux interphalangeal joint with decent range of motion at the level of the first metatarsophalangeal joint but there is crepitation and pain on end range of motion. 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 Cutaneous evaluation and straight supple well-hydrated cutis no erythema edema saline as drainage or odor.        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  Plan: We discussed the etiology pathology conservative versus surgical therapies. At this point we consented her for a Keller arthroplasty with a single silicone implant of her left first metatarsophalangeal joint. She understands this and is amenable to it. I went over the consent form today in great detail going line by line number by number asking if she had questions. I answered his questions to the best of my ability in layman's terms. We did discuss the possible postop complications which may include but are not limited to postop pain bleeding swelling infection and recurrence and need for further surgery overcorrection under correction the possibility of rejection of the implant possible loss of digit loss of limb and loss of life. She understands this is amenable to it and would like to proceed. This will be performed at a outpatient surgical center. We discussed the anesthesia and the need for anesthesia today. I will follow-up with her after medical clearance from her primary provider.

## 2015-04-24 NOTE — Patient Instructions (Signed)
Pre-Operative Instructions  Congratulations, you have decided to take an important step to improving your quality of life.  You can be assured that the doctors of [facility] will be with you every step of the way.  1. Plan to be at the surgery center/hospital at least 1 (one) hour prior to your scheduled time unless otherwise directed by the surgical center/hospital staff.  You must have a responsible adult accompany you, remain during the surgery and drive you home.  Make sure you have directions to the surgical center/hospital and know how to get there on time. 2. For hospital based surgery you will need to obtain a history and physical form from your family physician within 1 month prior to the date of surgery- we will give you a form for you primary physician.  3. We make every effort to accommodate the date you request for surgery.  There are however, times where surgery dates or times have to be moved.  We will contact you as soon as possible if a change in schedule is required.   4. No Aspirin/Ibuprofen for one week before surgery.  If you are on aspirin, any non-steroidal anti-inflammatory medications (Mobic, Aleve, Ibuprofen) you should stop taking it 7 days prior to your surgery.  You make take Tylenol  For pain prior to surgery.  5. Medications- If you are taking daily heart and blood pressure medications, seizure, reflux, allergy, asthma, anxiety, pain or diabetes medications, make sure the surgery center/hospital is aware before the day of surgery so they may notify you which medications to take or avoid the day of surgery. 6. No food or drink after midnight the night before surgery unless directed otherwise by surgical center/hospital staff. 7. No alcoholic beverages 24 hours prior to surgery.  No smoking 24 hours prior to or 24 hours after surgery. 8. Wear loose pants or shorts- loose enough to fit over bandages, boots, and casts. 9. No slip on shoes, sneakers are best. 10. Bring  your boot with you to the surgery center/hospital.  Also bring crutches or a walker if your physician has prescribed it for you.  If you do not have this equipment, it will be provided for you after surgery. 11. If you have not been contracted by the surgery center/hospital by the day before your surgery, call to confirm the date and time of your surgery. 12. Leave-time from work may vary depending on the type of surgery you have.  Appropriate arrangements should be made prior to surgery with your employer. 13. Prescriptions will be provided immediately following surgery by your doctor.  Have these filled as soon as possible after surgery and take the medication as directed. 14. Remove nail polish on the operative foot. 15. Wash the night before surgery.  The night before surgery wash the foot and leg well with the antibacterial soap provided and water paying special attention to beneath the toenails and in between the toes.  Rinse thoroughly with water and dry well with a towel.  Perform this wash unless told not to do so by your physician.  Enclosed: 1 Ice pack (please put in freezer the night before surgery)   1 Hibiclens skin cleaner   Pre-op Instructions  If you have any questions regarding the instructions, do not hesitate to call our office.  [place]: [address] [place], [place] [address] [phone]  [place]: [address] [place], [place] [address] [phone]  [place]: [address]  [place], [place] [address] [phone]  Dr. Richard   Tuchman DPM, Dr. Norman Regal DPM Dr. Richard Sikora DPM, Dr. M. Todd Hyatt DPM, Dr. Kathryn Egerton DPM 

## 2015-05-20 ENCOUNTER — Encounter: Payer: Self-pay | Admitting: *Deleted

## 2015-05-20 ENCOUNTER — Telehealth: Payer: Self-pay | Admitting: *Deleted

## 2015-05-20 NOTE — Telephone Encounter (Signed)
Per Theo Dills., patient called and stated "Medicaid is being processed. I do not have Medicaid at present time.  Surgery scheduled for 05/23/2015."  I'm returning your call.  "When your kids reach a certain age Medicaid will stop your insurance.  That's my case but they're going to let me keep it.  They're processing it now.  I have paperwork stating they are working on it.  Will this be okay for me to still have my surgery?"  We can't do surgery unless you have the actual card, everything has been processed.  We're going to have to reschedule the surgery.  "When can I get it done in May?  It may take 2-3 weeks for it to be processed completely."  He can do it May 5th.  "If I get it before then can I call and have it done sooner?"  Yes, if there is any time available you can.  I called and left a message for Caren Griffins at [facility] to reschedule surgery from 05/23/2015 to 07/04/2015.

## 2015-05-20 NOTE — Telephone Encounter (Signed)
gotta wait on card.

## 2015-05-22 ENCOUNTER — Other Ambulatory Visit: Payer: Self-pay | Admitting: Podiatry

## 2015-05-22 MED ORDER — IBUPROFEN 800 MG PO TABS: 800.0000 mg | ORAL_TABLET | Freq: Three times a day (TID) | ORAL | Status: DC | PRN

## 2015-05-22 MED ORDER — CEPHALEXIN 500 MG PO CAPS: 500.0000 mg | ORAL_CAPSULE | Freq: Three times a day (TID) | ORAL | Status: DC

## 2015-05-22 MED ORDER — PROMETHAZINE HCL 25 MG PO TABS: 25.0000 mg | ORAL_TABLET | Freq: Three times a day (TID) | ORAL | Status: DC | PRN

## 2015-05-23 ENCOUNTER — Telehealth: Payer: Self-pay | Admitting: *Deleted

## 2015-05-23 NOTE — Telephone Encounter (Signed)
"  I'm scheduled for surgery on my foot on May 5.  If you would, give me a call back.  I need a copy of my scheduled appointment so I can send it in to my disability attorney.  Please call me back at your most earliest convenience."

## 2015-05-29 ENCOUNTER — Encounter: Payer: Self-pay | Admitting: Podiatry

## 2015-05-30 NOTE — Progress Notes (Signed)
This encounter was created in error - please disregard.

## 2015-06-03 ENCOUNTER — Encounter: Payer: Self-pay | Admitting: *Deleted

## 2015-06-03 NOTE — Telephone Encounter (Signed)
I'm returning your call.  "I just wanted to let you know that they are still working on my adult Medicaid.  If they approve it soon can I reschedule my surgery to a sooner date?"  No, he's getting full.  He's already booking for April 28.  So I advise you to just stay where you are.  "Is there a way I can get a copy of my surgery scheduling to take to my doctor?"  Yes, you can come by to get one.  "Can I come now to get one?"  Yes, you can come now.  "I'll be there shortly."

## 2015-06-23 ENCOUNTER — Telehealth: Payer: Self-pay | Admitting: *Deleted

## 2015-06-23 NOTE — Telephone Encounter (Signed)
"  I'm scheduled for surgery next week.  I need to reschedule that because I'm still waiting for my Medicaid to click in.  Please give me a call back.  Have a good day."

## 2015-06-23 NOTE — Telephone Encounter (Signed)
"  I thought maybe you would be at your desk.  My Medicaid switched to pre-natal when my daughter turned 31.  I had to go back and re-apply for adult Medicaid.  They are working on it.  It does sound like it's going to go through but I don't know if it's going to click in before my surgery.  I want to know if I can reschedule that appointment to about a month later.  Please call me back.  Thank you and have a good day."

## 2015-06-24 NOTE — Telephone Encounter (Signed)
"  I'm returning your call.  You want to reschedule your surgery a month away?  "Yes, my Medicaid should be approved by then."  He can do it on June 9.  Is that date okay?  "Yes, that will be fine."  I called and left a message for Caren Griffins at [facility] to reschedule surgery from 07/04/2015 to 08/08/2015.

## 2015-06-24 NOTE — Telephone Encounter (Signed)
OK 

## 2015-07-10 ENCOUNTER — Encounter: Payer: Self-pay | Admitting: Podiatry

## 2015-08-06 ENCOUNTER — Other Ambulatory Visit: Payer: Self-pay | Admitting: Podiatry

## 2015-08-06 ENCOUNTER — Telehealth: Payer: Self-pay | Admitting: *Deleted

## 2015-08-06 MED ORDER — IBUPROFEN 800 MG PO TABS: 800.0000 mg | ORAL_TABLET | Freq: Three times a day (TID) | ORAL | Status: DC | PRN

## 2015-08-06 MED ORDER — CEPHALEXIN 500 MG PO CAPS
500.0000 mg | ORAL_CAPSULE | Freq: Three times a day (TID) | ORAL | Status: DC
Start: 2015-08-06 — End: 2015-11-11

## 2015-08-06 NOTE — Telephone Encounter (Signed)
I called and spoke to Ashley at Dr. Avbuere's office inquiring about medical clearance request.  She stated she would check with Dr. Avbuere and give me a call back. 

## 2015-08-07 ENCOUNTER — Telehealth: Payer: Self-pay | Admitting: *Deleted

## 2015-08-07 NOTE — Telephone Encounter (Signed)
"  Tonya Mckenzie canceled.  She said her Medicaid ran out.  She said she wants to schedule 3 months out.  You call her or she call you."  Dr. Milinda Pointer was informed.

## 2015-08-11 ENCOUNTER — Telehealth: Payer: Self-pay | Admitting: *Deleted

## 2015-08-11 NOTE — Telephone Encounter (Addendum)
Entered in error. Entered in error.

## 2015-08-14 ENCOUNTER — Encounter: Payer: Self-pay | Admitting: Podiatry

## 2015-09-01 ENCOUNTER — Other Ambulatory Visit: Payer: Self-pay | Admitting: Podiatry

## 2015-09-10 ENCOUNTER — Other Ambulatory Visit: Payer: Self-pay | Admitting: Podiatry

## 2015-09-29 ENCOUNTER — Other Ambulatory Visit: Payer: Self-pay | Admitting: Podiatry

## 2015-09-30 NOTE — Telephone Encounter (Signed)
Pt needs an appt prior to future refills. 

## 2015-10-06 ENCOUNTER — Other Ambulatory Visit: Payer: Self-pay | Admitting: Internal Medicine

## 2015-10-06 DIAGNOSIS — Z1231 Encounter for screening mammogram for malignant neoplasm of breast: Secondary | ICD-10-CM

## 2015-10-21 ENCOUNTER — Ambulatory Visit: Payer: Medicaid Other

## 2015-11-05 ENCOUNTER — Ambulatory Visit
Admission: RE | Admit: 2015-11-05 | Discharge: 2015-11-05 | Disposition: A | Discharge status: Home / Self Care | Payer: Medicaid Other | Source: Ambulatory Visit | Attending: Internal Medicine | Admitting: Internal Medicine

## 2015-11-05 ENCOUNTER — Other Ambulatory Visit: Payer: Self-pay | Admitting: Internal Medicine

## 2015-11-05 DIAGNOSIS — Z1231 Encounter for screening mammogram for malignant neoplasm of breast: Secondary | ICD-10-CM

## 2015-11-05 DIAGNOSIS — E2839 Other primary ovarian failure: Secondary | ICD-10-CM

## 2015-11-11 ENCOUNTER — Ambulatory Visit (INDEPENDENT_AMBULATORY_CARE_PROVIDER_SITE_OTHER): Payer: Medicaid Other

## 2015-11-11 ENCOUNTER — Ambulatory Visit (INDEPENDENT_AMBULATORY_CARE_PROVIDER_SITE_OTHER): Payer: Medicaid Other | Admitting: Podiatry

## 2015-11-11 ENCOUNTER — Encounter: Payer: Self-pay | Admitting: Podiatry

## 2015-11-11 DIAGNOSIS — S92911A Unspecified fracture of right toe(s), initial encounter for closed fracture: Secondary | ICD-10-CM | POA: Diagnosis not present

## 2015-11-11 DIAGNOSIS — M205X2 Other deformities of toe(s) (acquired), left foot: Secondary | ICD-10-CM | POA: Diagnosis not present

## 2015-11-11 MED ORDER — TRAMADOL HCL 50 MG PO TABS: 50.0000 mg | ORAL_TABLET | Freq: Two times a day (BID) | ORAL | 0 refills | Status: DC

## 2015-11-11 NOTE — Progress Notes (Signed)
She presents today with a chief complaint of pain to the fifth digit of the right foot after she had a door facing running from bees. She's also like to re-sign her consent form and schedule surgery for her left foot..  Objective: Vital signs are stable she is alert and oriented 3 no change in her past mental history medications or allergies. She still has pain on range of motion of the first metatarsophalangeal joint of the left foot was laid tender on palpation. Reviewed radiographs with her once again today. Fifth digit of the right foot demonstrates edema no erythema or ecchymosis cellulitis drainage or odor. No open lesions or wounds. Exquisite point tenderness palpation of the proximal phalanx. Radiographs do demonstrate a completely fractured and comminuted proximal phalanx fifth digit right foot.  Assessment: Fracture fifth digit right foot. 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  Plan: I showed her how to tape the fifth toe today. She re-sign all of her consent form today for her left foot surgery. We went over the consent once again line by line number by no giving her ample time to ask questions off a regarding the surgery and its outcome. We discussed all the possible postop complications. She understands this is amenable to it and will follow up with me in the near future.

## 2015-11-21 ENCOUNTER — Encounter: Payer: Self-pay | Admitting: Podiatry

## 2015-11-21 NOTE — Progress Notes (Signed)
Medical Records mailed to [facility].

## 2015-11-25 ENCOUNTER — Encounter: Payer: Self-pay | Admitting: *Deleted

## 2015-12-05 ENCOUNTER — Other Ambulatory Visit: Payer: Self-pay | Admitting: Podiatry

## 2015-12-11 ENCOUNTER — Other Ambulatory Visit: Payer: Self-pay | Admitting: Podiatry

## 2015-12-11 MED ORDER — CEPHALEXIN 500 MG PO CAPS: 500.0000 mg | ORAL_CAPSULE | Freq: Three times a day (TID) | ORAL | 0 refills | Status: DC

## 2015-12-11 MED ORDER — IBUPROFEN 600 MG PO TABS
600.0000 mg | ORAL_TABLET | Freq: Three times a day (TID) | ORAL | 0 refills | Status: DC
Start: 2015-12-11 — End: 2016-02-06

## 2015-12-12 ENCOUNTER — Encounter: Payer: Self-pay | Admitting: Podiatry

## 2015-12-12 DIAGNOSIS — M2012 Hallux valgus (acquired), left foot: Secondary | ICD-10-CM | POA: Diagnosis not present

## 2015-12-15 ENCOUNTER — Telehealth: Payer: Self-pay | Admitting: *Deleted

## 2015-12-15 NOTE — Telephone Encounter (Signed)
She should be taking 800mg  of ibuprofen as I instructed.  She is a recovering drug addict so no pain meds.  May add tylenol.  Stay off foot and continue to ice.

## 2015-12-15 NOTE — Telephone Encounter (Addendum)
Pt states the pain is shooting in her foot, worse elevated.  I spoke with pt, instructed pt to remove the boot, open-ended sock and ace wrap, then dangle the foot for 15 minutes, later place the foot level with the hip and re wrap the ace looser, reapply the boot, and either have the foot level with the hip or elevated. Pt states the tramadol is not helping. I instructed pt to take the OTC ibuprofen if can tolerate, take as the package instructs. 12/16/2015-Informed pt of Dr. Stephenie Acres orders and she states understanding.

## 2015-12-18 ENCOUNTER — Encounter: Payer: Self-pay | Admitting: Podiatry

## 2015-12-18 ENCOUNTER — Ambulatory Visit (INDEPENDENT_AMBULATORY_CARE_PROVIDER_SITE_OTHER): Payer: Medicaid Other

## 2015-12-18 ENCOUNTER — Ambulatory Visit (INDEPENDENT_AMBULATORY_CARE_PROVIDER_SITE_OTHER): Payer: Medicaid Other | Admitting: Podiatry

## 2015-12-18 ENCOUNTER — Other Ambulatory Visit: Payer: Self-pay | Admitting: Podiatry

## 2015-12-18 VITALS — BP 132/74 | Temp 98.0°F

## 2015-12-18 DIAGNOSIS — Z9889 Other specified postprocedural states: Secondary | ICD-10-CM

## 2015-12-18 DIAGNOSIS — M2012 Hallux valgus (acquired), left foot: Secondary | ICD-10-CM

## 2015-12-18 DIAGNOSIS — M205X2 Other deformities of toe(s) (acquired), left foot: Secondary | ICD-10-CM

## 2015-12-18 NOTE — Progress Notes (Signed)
DOS 10.13.2017 Jake Michaelis Arthroplasty with Single Silicone Implant Left Foot

## 2015-12-18 NOTE — Progress Notes (Signed)
She presents today for her first postop visit date of surgery was 12/12/2015 Jake Michaelis bunion implant left foot. She states that her pain is about a 1 out of 10. She is doing much better and states that she is pleased with how the toe looks and feels.  Objective: Vital signs are stable she is alert and oriented 3. Pulses are palpable. Dry sterile dressing was removed demonstrates minimal edema sutures are intact margins remain well coapted she has great range of motion of the first metatarsophalangeal joint without crepitation and pain. Radiograph demonstrates Keller arthroplasty with single silicone implant hallux left.  Assessment: Well-healing surgical foot left.  Plan: Demonstrated range of motion exercises which I want her to continue I will follow-up with her in 1 week. Redressed today dresser or compressive dressing continue use of the Cam Walker keep this elevated stay off of it as much as possible.:

## 2015-12-23 ENCOUNTER — Telehealth: Payer: Self-pay | Admitting: *Deleted

## 2015-12-23 NOTE — Telephone Encounter (Addendum)
Pt states her dressing has come off.  I informed pt if she would come to the office after 1:30pm, I would apply a fresh dressing. Pt states understanding.

## 2015-12-25 ENCOUNTER — Ambulatory Visit: Payer: Medicaid Other | Admitting: Podiatry

## 2015-12-30 ENCOUNTER — Telehealth: Payer: Self-pay | Admitting: Gastroenterology

## 2015-12-30 ENCOUNTER — Ambulatory Visit: Payer: Medicaid Other | Admitting: Gastroenterology

## 2015-12-30 NOTE — Telephone Encounter (Signed)
Not this time. Yes if occurs again

## 2016-01-06 ENCOUNTER — Other Ambulatory Visit: Payer: Self-pay | Admitting: Podiatry

## 2016-01-08 ENCOUNTER — Ambulatory Visit: Payer: Medicaid Other | Admitting: Podiatry

## 2016-01-13 ENCOUNTER — Ambulatory Visit (INDEPENDENT_AMBULATORY_CARE_PROVIDER_SITE_OTHER): Payer: Medicaid Other

## 2016-01-13 ENCOUNTER — Ambulatory Visit (INDEPENDENT_AMBULATORY_CARE_PROVIDER_SITE_OTHER): Payer: Medicaid Other | Admitting: Podiatry

## 2016-01-13 ENCOUNTER — Encounter: Payer: Self-pay | Admitting: Podiatry

## 2016-01-13 DIAGNOSIS — Z9889 Other specified postprocedural states: Secondary | ICD-10-CM

## 2016-01-13 DIAGNOSIS — M205X2 Other deformities of toe(s) (acquired), left foot: Secondary | ICD-10-CM

## 2016-01-13 DIAGNOSIS — M2012 Hallux valgus (acquired), left foot: Secondary | ICD-10-CM | POA: Diagnosis not present

## 2016-01-13 MED ORDER — METHYLPREDNISOLONE 4 MG PO TBPK: ORAL_TABLET | ORAL | 0 refills | Status: DC

## 2016-01-14 NOTE — Progress Notes (Signed)
She presents today for follow-up of her Keller arthroplasty single silicone implant left foot. She states that it is feels so much better than it did before she states that I get a sharp pain every once a while that runs up in my but cheeks on both sides.  Objective: Vital signs are stable she is alert and oriented 3. Minimal swelling to the left lower extremity she has great range of motion of the first metatarsophalangeal joint completely pain-free. No reproducible pain today. Radiographs reviewed today demonstrate well-placed single silicone implant with arthroplasty.  Assessment: Well-healing surgical foot left. Probable sciatica.  Plan: Started her on a steroid Dosepak to help with the sciatica and the edema to the left foot  I will follow-up with her in 1 month.

## 2016-02-06 ENCOUNTER — Ambulatory Visit (INDEPENDENT_AMBULATORY_CARE_PROVIDER_SITE_OTHER): Payer: Medicaid Other | Admitting: Gastroenterology

## 2016-02-06 ENCOUNTER — Encounter: Payer: Self-pay | Admitting: Gastroenterology

## 2016-02-06 VITALS — BP 110/72 | HR 64 | Ht 66.0 in | Wt 180.2 lb

## 2016-02-06 DIAGNOSIS — R1013 Epigastric pain: Secondary | ICD-10-CM

## 2016-02-06 DIAGNOSIS — G8929 Other chronic pain: Secondary | ICD-10-CM | POA: Diagnosis not present

## 2016-02-06 NOTE — Patient Instructions (Signed)
If you are age 53 or older, your body mass index should be between 23-30. Your Body mass index is 29.09 kg/m. If this is out of the aforementioned range listed, please consider follow up with your Primary Care Provider.  If you are age 20 or younger, your body mass index should be between 19-25. Your Body mass index is 29.09 kg/m. If this is out of the aformentioned range listed, please consider follow up with your Primary Care Provider.   You have been scheduled for an endoscopy. Please follow written instructions given to you at your visit today. If you use inhalers (even only as needed), please bring them with you on the day of your procedure. Your physician has requested that you go to www.startemmi.com and enter the access code given to you at your visit today. This web site gives a general overview about your procedure. However, you should still follow specific instructions given to you by our office regarding your preparation for the procedure.  Thank you for choosing [facility]  Dr Wilfrid Lund III

## 2016-02-06 NOTE — Progress Notes (Signed)
[facility] Consult Note:  History: JASA BUCEK 02/06/2016  Referring physician: Philis Fendt, MD  Reason for consult/chief complaint: Abdominal Pain (LUQ X 9 months) and Gastroesophageal Reflux   Subjective  HPI:  9 months LUQ burning pain, worse when standing, sometimes feels like pulling, then change position and better. No nausea, vomiting, early satiety, weight loss. No change bowels or rectal bleeding. Nml colon Deatra Ina 3/16 No trauma  to the area ROS:  Review of Systems  Constitutional: Negative for appetite change and unexpected weight change.  HENT: Negative for mouth sores and voice change.   Eyes: Negative for pain and redness.  Respiratory: Negative for cough and shortness of breath.   Cardiovascular: Negative for chest pain and palpitations.  Genitourinary: Negative for dysuria and hematuria.  Musculoskeletal: Negative for arthralgias and myalgias.  Skin: Negative for pallor and rash.  Neurological: Negative for weakness and headaches.  Hematological: Negative for adenopathy.     Past Medical History: Past Medical History:  Diagnosis Date  . Allergic rhinitis   . Anxiety   . Chronic post-traumatic stress disorder (PTSD)   . Cocaine abuse   . Depression   . Diabetes mellitus, type 2 ([place])   . ETOH abuse   . GERD (gastroesophageal reflux disease)   . Hyperlipidemia   . Hypertension    off meds due to weight loss  . Marijuana abuse   . Uterine leiomyoma      Past Surgical History: Past Surgical History:  Procedure Laterality Date  . ABDOMINAL HYSTERECTOMY    . CHOLECYSTECTOMY    . TONSILLECTOMY    . TYMPANOSTOMY TUBE PLACEMENT       Family History: Family History  Problem Relation Age of Onset  . Diabetes Mother   . Hypertension Mother   . Heart attack Father   . Colon cancer Neg Hx   . Esophageal cancer Neg Hx   . Rectal cancer Neg Hx   . Stomach cancer Neg Hx   . Liver cancer Neg Hx     Social History: Social  History   Social History  . Marital status: Single    Spouse name: N/A  . Number of children: 4  . Years of education: N/A   Occupational History  . unemployed    Social History Main Topics  . Smoking status: Light Tobacco Smoker  . Smokeless tobacco: Never Used     Comment: 1-2 cigarettes per month  . Alcohol use Yes     Comment: socially  . Drug use: No     Comment: reformed went through rehab  . Sexual activity: Not Asked   Other Topics Concern  . None   Social History Narrative  . None    Allergies: No Known Allergies  Outpatient Meds: Current Outpatient Prescriptions  Medication Sig Dispense Refill  . cloNIDine (CATAPRES) 0.2 MG tablet TK 1 T PO  BID  2  . dexlansoprazole (DEXILANT) 60 MG capsule Take 60 mg by mouth daily.    Marland Kitchen ibuprofen (ADVIL,MOTRIN) 800 MG tablet Take 1 tablet (800 mg total) by mouth every 8 (eight) hours as needed. 30 tablet 0  . pravastatin (PRAVACHOL) 40 MG tablet Take 40 mg by mouth daily.    . sertraline (ZOLOFT) 50 MG tablet TK 1 T PO  QAM  2  . traMADol (ULTRAM) 50 MG tablet Take 1 tablet (50 mg total) by mouth 2 (two) times daily. 50 tablet 0  . traZODone (DESYREL) 100 MG tablet Take 100 mg by  mouth at bedtime.     No current facility-administered medications for this visit.     Reports that Dexilant seems to help  ___________________________________________________________________ Objective   Exam:  BP 110/72   Pulse 64   Ht 5\' 6"  (1.676 m)   Wt 180 lb 4 oz (81.8 kg)   BMI 29.09 kg/m    General: this is a(n) well-appearing   Eyes: sclera anicteric, no redness  ENT: oral mucosa moist without lesions, no cervical or supraclavicular lymphadenopathy, good dentition  CV: RRR without murmur, S1/S2, no JVD, no peripheral edema  Resp: clear to auscultation bilaterally, normal RR and effort noted  GI: soft, mild epigastric tenderness to deep palpation, with active bowel sounds. No guarding or palpable organomegaly  noted.  Skin; warm and dry, no rash or jaundice noted  Neuro: awake, alert and oriented x 3. Normal gross motor function and fluent speech  Labs: nml CBC 9/17 in PCP notes  Assessment: Encounter Diagnosis  Name Primary?  . Abdominal pain, chronic, epigastric Yes    Not clear if UGI cause vs musculoskeletal  Plan: EGD Decrease NSAIDs  Total time 30 minutes  Nelida Meuse III  CC: Philis Fendt, MD

## 2016-02-12 ENCOUNTER — Ambulatory Visit (INDEPENDENT_AMBULATORY_CARE_PROVIDER_SITE_OTHER): Payer: Medicaid Other | Admitting: Podiatry

## 2016-02-12 DIAGNOSIS — Z9889 Other specified postprocedural states: Secondary | ICD-10-CM

## 2016-02-12 DIAGNOSIS — M205X2 Other deformities of toe(s) (acquired), left foot: Secondary | ICD-10-CM

## 2016-03-09 ENCOUNTER — Ambulatory Visit (INDEPENDENT_AMBULATORY_CARE_PROVIDER_SITE_OTHER): Payer: Medicaid Other

## 2016-03-09 ENCOUNTER — Ambulatory Visit (INDEPENDENT_AMBULATORY_CARE_PROVIDER_SITE_OTHER): Payer: Self-pay | Admitting: Podiatry

## 2016-03-09 DIAGNOSIS — M205X2 Other deformities of toe(s) (acquired), left foot: Secondary | ICD-10-CM

## 2016-03-09 DIAGNOSIS — Z9889 Other specified postprocedural states: Secondary | ICD-10-CM

## 2016-03-09 DIAGNOSIS — M2012 Hallux valgus (acquired), left foot: Secondary | ICD-10-CM

## 2016-03-09 NOTE — Progress Notes (Signed)
She presents today for follow-up of her Jake Michaelis bunionectomy and plan left foot. States this seems to be doing pretty good but is a little bit stiff.  Objective: Vital signs stable and 3 still has mild swelling about the foot radiographs that Mr. well placed [facility] arthroplasty with single silicone implant with grommets. No signs of infection and no signs of bony overgrowth.  Assessment: Well-healing surgical foot 3 months.  Plan: I will request physical therapy Alleviate some of her symptoms.

## 2016-03-16 ENCOUNTER — Telehealth: Payer: Self-pay | Admitting: Gastroenterology

## 2016-03-17 ENCOUNTER — Encounter: Payer: Medicaid Other | Admitting: Gastroenterology

## 2016-04-03 ENCOUNTER — Encounter (HOSPITAL_COMMUNITY): Payer: Self-pay | Admitting: *Deleted

## 2016-04-03 ENCOUNTER — Ambulatory Visit (HOSPITAL_COMMUNITY)
Admission: EM | Admit: 2016-04-03 | Discharge: 2016-04-03 | Disposition: A | Discharge status: Home / Self Care | Payer: Medicaid Other | Attending: Family Medicine | Admitting: Family Medicine

## 2016-04-03 DIAGNOSIS — F419 Anxiety disorder, unspecified: Secondary | ICD-10-CM | POA: Diagnosis not present

## 2016-04-03 DIAGNOSIS — F17210 Nicotine dependence, cigarettes, uncomplicated: Secondary | ICD-10-CM | POA: Insufficient documentation

## 2016-04-03 DIAGNOSIS — Z833 Family history of diabetes mellitus: Secondary | ICD-10-CM | POA: Insufficient documentation

## 2016-04-03 DIAGNOSIS — F329 Major depressive disorder, single episode, unspecified: Secondary | ICD-10-CM | POA: Insufficient documentation

## 2016-04-03 DIAGNOSIS — N941 Unspecified dyspareunia: Secondary | ICD-10-CM | POA: Diagnosis not present

## 2016-04-03 DIAGNOSIS — R109 Unspecified abdominal pain: Secondary | ICD-10-CM | POA: Diagnosis not present

## 2016-04-03 DIAGNOSIS — I10 Essential (primary) hypertension: Secondary | ICD-10-CM | POA: Insufficient documentation

## 2016-04-03 DIAGNOSIS — Z9049 Acquired absence of other specified parts of digestive tract: Secondary | ICD-10-CM | POA: Insufficient documentation

## 2016-04-03 DIAGNOSIS — Z79899 Other long term (current) drug therapy: Secondary | ICD-10-CM | POA: Diagnosis not present

## 2016-04-03 DIAGNOSIS — N898 Other specified noninflammatory disorders of vagina: Secondary | ICD-10-CM

## 2016-04-03 DIAGNOSIS — E785 Hyperlipidemia, unspecified: Secondary | ICD-10-CM | POA: Diagnosis not present

## 2016-04-03 DIAGNOSIS — R300 Dysuria: Secondary | ICD-10-CM | POA: Diagnosis not present

## 2016-04-03 DIAGNOSIS — K219 Gastro-esophageal reflux disease without esophagitis: Secondary | ICD-10-CM | POA: Diagnosis not present

## 2016-04-03 DIAGNOSIS — Z8249 Family history of ischemic heart disease and other diseases of the circulatory system: Secondary | ICD-10-CM | POA: Insufficient documentation

## 2016-04-03 DIAGNOSIS — Z9889 Other specified postprocedural states: Secondary | ICD-10-CM | POA: Diagnosis not present

## 2016-04-03 LAB — POCT URINALYSIS DIP (DEVICE)
BILIRUBIN URINE: NEGATIVE
Glucose, UA: NEGATIVE mg/dL
HGB URINE DIPSTICK: NEGATIVE
KETONES UR: NEGATIVE mg/dL
Leukocytes, UA: NEGATIVE
Nitrite: NEGATIVE
PH: 5.5 (ref 5.0–8.0)
PROTEIN: NEGATIVE mg/dL
Urobilinogen, UA: 0.2 mg/dL (ref 0.0–1.0)

## 2016-04-03 MED ORDER — AZITHROMYCIN 250 MG PO TABS
ORAL_TABLET | ORAL | Status: AC
  Filled 2016-04-03: qty 4

## 2016-04-03 MED ORDER — CEFTRIAXONE SODIUM 250 MG IJ SOLR
INTRAMUSCULAR | Status: AC
  Filled 2016-04-03: qty 250

## 2016-04-03 MED ORDER — METRONIDAZOLE 500 MG PO TABS: 500.0000 mg | ORAL_TABLET | Freq: Two times a day (BID) | ORAL | 0 refills | Status: AC

## 2016-04-03 MED ORDER — LIDOCAINE HCL (PF) 1 % IJ SOLN
INTRAMUSCULAR | Status: AC
  Filled 2016-04-03: qty 2

## 2016-04-03 MED ORDER — AZITHROMYCIN 250 MG PO TABS
1000.0000 mg | ORAL_TABLET | Freq: Once | ORAL | Status: AC
  Administered 2016-04-03: 1000 mg via ORAL

## 2016-04-03 MED ORDER — CEFTRIAXONE SODIUM 250 MG IJ SOLR
250.0000 mg | Freq: Once | INTRAMUSCULAR | Status: AC
  Administered 2016-04-03: 250 mg via INTRAMUSCULAR

## 2016-04-03 MED ORDER — FLUCONAZOLE 150 MG PO TABS: 150.0000 mg | ORAL_TABLET | Freq: Every day | ORAL | 0 refills | Status: DC

## 2016-04-03 NOTE — ED Triage Notes (Signed)
Pt also concerned about possible UTI because of cloudy urine.

## 2016-04-03 NOTE — ED Provider Notes (Signed)
CSN: [id number]     Arrival date & time 04/03/16  1525 History   First MD Initiated Contact with Patient 04/03/16 1641     Chief Complaint  Patient presents with  . Vaginal Discharge   (Consider location/radiation/quality/duration/timing/severity/associated sxs/prior Treatment) Reports dysuria and cloudy urine. Had intercourse 2 days ago and reports the condom broke and now she is having symptoms. She has multiple sexual partners. Patient would like to be treated for STD today.    The history is provided by the patient.  Vaginal Discharge  Quality:  Malodorous and white Severity:  Moderate Onset quality:  Sudden Timing:  Constant Progression:  Unchanged Chronicity:  New Associated symptoms: abdominal pain, dyspareunia, dysuria and vaginal itching   Associated symptoms: no fever, no genital lesions, no nausea, no rash and no urinary frequency   Risk factors: no new sexual partner     Past Medical History:  Diagnosis Date  . Allergic rhinitis   . Anxiety   . Chronic post-traumatic stress disorder (PTSD)   . Cocaine abuse   . Depression   . ETOH abuse   . GERD (gastroesophageal reflux disease)   . Hyperlipidemia   . Hypertension    off meds due to weight loss  . Marijuana abuse   . Uterine leiomyoma    Past Surgical History:  Procedure Laterality Date  . ABDOMINAL HYSTERECTOMY     partial  . CHOLECYSTECTOMY    . TOE SURGERY    . TONSILLECTOMY    . TYMPANOSTOMY TUBE PLACEMENT     Family History  Problem Relation Age of Onset  . Diabetes Mother   . Hypertension Mother   . Heart attack Father   . Colon cancer Neg Hx   . Esophageal cancer Neg Hx   . Rectal cancer Neg Hx   . Stomach cancer Neg Hx   . Liver cancer Neg Hx    Social History  Substance Use Topics  . Smoking status: Light Tobacco Smoker  . Smokeless tobacco: Never Used     Comment: 1-2 cigarettes per month  . Alcohol use Yes     Comment: socially   OB History    No data available     Review of  Systems  Constitutional: Negative for fever.  Respiratory: Negative.   Cardiovascular: Negative.   Gastrointestinal: Positive for abdominal pain. Negative for nausea.  Genitourinary: Positive for dyspareunia, dysuria and vaginal discharge. Negative for flank pain and hematuria.  Neurological: Negative for dizziness and headaches.    Allergies  Patient has no known allergies.  Home Medications   Prior to Admission medications   Medication Sig Start Date End Date Taking? Authorizing Provider  cloNIDine (CATAPRES) 0.2 MG tablet TK 1 T PO  BID 03/25/15  Yes Historical Provider, MD  dexlansoprazole (DEXILANT) 60 MG capsule Take 60 mg by mouth daily.   Yes Historical Provider, MD  pravastatin (PRAVACHOL) 40 MG tablet Take 40 mg by mouth daily.   Yes Historical Provider, MD  traZODone (DESYREL) 100 MG tablet Take 100 mg by mouth at bedtime.   Yes Historical Provider, MD  fluconazole (DIFLUCAN) 150 MG tablet Take 1 tablet (150 mg total) by mouth daily. 04/03/16   Barry Dienes, NP  ibuprofen (ADVIL,MOTRIN) 800 MG tablet Take 1 tablet (800 mg total) by mouth every 8 (eight) hours as needed. 08/06/15   Max T Hyatt, DPM  metroNIDAZOLE (FLAGYL) 500 MG tablet Take 1 tablet (500 mg total) by mouth 2 (two) times daily. 04/03/16 04/10/16  Burr Medico  Markus Jarvis, NP  sertraline (ZOLOFT) 50 MG tablet TK 1 T PO  QAM 04/19/15   Historical Provider, MD  traMADol (ULTRAM) 50 MG tablet Take 1 tablet (50 mg total) by mouth 2 (two) times daily. 11/11/15   Max Villa Herb, DPM   Meds Ordered and Administered this Visit   Medications  azithromycin (ZITHROMAX) tablet 1,000 mg (not administered)  cefTRIAXone (ROCEPHIN) injection 250 mg (not administered)    BP 130/84   Pulse 70   Temp 98.2 F (36.8 C) (Oral)   Resp 16   SpO2 100%  No data found.   Physical Exam  Constitutional: She is oriented to person, place, and time. She appears well-developed and well-nourished.  HENT:  Head: Normocephalic.  Cardiovascular: Normal rate,  regular rhythm and normal heart sounds.   Pulmonary/Chest: Effort normal and breath sounds normal. No respiratory distress. She has no wheezes.  Abdominal: Soft. Bowel sounds are normal. She exhibits no distension. There is no tenderness.  Genitourinary:  Genitourinary Comments: External labia minora and labia majora are unremarkable with no lesions. No vaginal discharge noted on the vaginal entry.  Vaginal canal pink and moist;no lesion. Small amount of white semi-thick vaginal discharge noted in the canal.  Cervix absent; reports to have hysterectomy  Neurological: She is alert and oriented to person, place, and time.  Skin: Skin is warm and dry.  Nursing note and vitals reviewed.   Urgent Care Course     Procedures (including critical care time)  Labs Review Labs Reviewed  POCT URINALYSIS DIP (DEVICE)  CERVICOVAGINAL ANCILLARY ONLY   Imaging Review No results found.   MDM   1. Vaginal discharge    1) Start Flagyl 500 mg BID x 7 days 2) Start Diflucan 150 mg once 3) Doubt STD but rocephin and azithromycin given per request 4) Return as needed   Barry Dienes, NP 04/03/16 1712

## 2016-04-03 NOTE — ED Triage Notes (Signed)
Pt c/o malodorous vaginal  Discharge x 2 days.  Reports condom breaking just prior to sxs.

## 2016-04-05 LAB — CERVICOVAGINAL ANCILLARY ONLY
CHLAMYDIA, DNA PROBE: NEGATIVE
Neisseria Gonorrhea: NEGATIVE
Wet Prep (BD Affirm): POSITIVE — AB

## 2016-04-06 ENCOUNTER — Ambulatory Visit (AMBULATORY_SURGERY_CENTER): Payer: Medicaid Other | Admitting: Gastroenterology

## 2016-04-06 ENCOUNTER — Encounter: Payer: Self-pay | Admitting: Gastroenterology

## 2016-04-06 VITALS — BP 122/89 | HR 62 | Temp 98.7°F | Resp 16 | Ht 66.0 in | Wt 180.0 lb

## 2016-04-06 DIAGNOSIS — K2970 Gastritis, unspecified, without bleeding: Secondary | ICD-10-CM

## 2016-04-06 DIAGNOSIS — R1013 Epigastric pain: Secondary | ICD-10-CM

## 2016-04-06 DIAGNOSIS — K2990 Gastroduodenitis, unspecified, without bleeding: Secondary | ICD-10-CM

## 2016-04-06 MED ORDER — SODIUM CHLORIDE 0.9 % IV SOLN: 500.0000 mL | INTRAVENOUS | Status: DC

## 2016-04-06 NOTE — Patient Instructions (Addendum)
YOU HAD AN ENDOSCOPIC PROCEDURE TODAY AT [facility]:   Refer to the procedure report that was given to you for any specific questions about what was found during the examination.  If the procedure report does not answer your questions, please call your gastroenterologist to clarify.  If you requested that your care partner not be given the details of your procedure findings, then the procedure report has been included in a sealed envelope for you to review at your convenience later.  YOU SHOULD EXPECT: Some feelings of bloating in the abdomen. Passage of more gas than usual.  Walking can help get rid of the air that was put into your GI tract during the procedure and reduce the bloating. If you had a lower endoscopy (such as a colonoscopy or flexible sigmoidoscopy) you may notice spotting of blood in your stool or on the toilet paper. If you underwent a bowel prep for your procedure, you may not have a normal bowel movement for a few days.  Please Note:  You might notice some irritation and congestion in your nose or some drainage.  This is from the oxygen used during your procedure.  There is no need for concern and it should clear up in a day or so.  SYMPTOMS TO REPORT IMMEDIATELY:   Following lower endoscopy (colonoscopy or flexible sigmoidoscopy):  Excessive amounts of blood in the stool  Significant tenderness or worsening of abdominal pains  Swelling of the abdomen that is new, acute  Fever of 100F or higher   Following upper endoscopy (EGD)  Vomiting of blood or coffee ground material  New chest pain or pain under the shoulder blades  Painful or persistently difficult swallowing  New shortness of breath  Fever of 100F or higher  Black, tarry-looking stools  For urgent or emergent issues, a gastroenterologist can be reached at any hour by calling [phone].   DIET:  We do recommend a small meal at first, but then you may proceed to your regular diet.  Drink  plenty of fluids but you should avoid alcoholic beverages for 24 hours.  ACTIVITY:  You should plan to take it easy for the rest of today and you should NOT DRIVE or use heavy machinery until tomorrow (because of the sedation medicines used during the test).    FOLLOW UP: Our staff will call the number listed on your records the next business day following your procedure to check on you and address any questions or concerns that you may have regarding the information given to you following your procedure. If we do not reach you, we will leave a message.  However, if you are feeling well and you are not experiencing any problems, there is no need to return our call.  We will assume that you have returned to your regular daily activities without incident.  If any biopsies were taken you will be contacted by phone or by letter within the next 1-3 weeks.  Please call us at [phone] if you have not heard about the biopsies in 3 weeks.    SIGNATURES/CONFIDENTIALITY: You and/or your care partner have signed paperwork which will be entered into your electronic medical record.  These signatures attest to the fact that that the information above on your After Visit Summary has been reviewed and is understood.  Full responsibility of the confidentiality of this discharge information lies with you and/or your care-partner.YOU HAD AN ENDOSCOPIC PROCEDURE TODAY AT [facility]:  Refer to the procedure report that was given to you for any specific questions about what was found during the examination.  If the procedure report does not answer your questions, please call your gastroenterologist to clarify.  If you requested that your care partner not be given the details of your procedure findings, then the procedure report has been included in a sealed envelope for you to review at your convenience later.  YOU SHOULD EXPECT: Some feelings of bloating in the abdomen. Passage of more gas than  usual.  Walking can help get rid of the air that was put into your GI tract during the procedure and reduce the bloating. If you had a lower endoscopy (such as a colonoscopy or flexible sigmoidoscopy) you may notice spotting of blood in your stool or on the toilet paper. If you underwent a bowel prep for your procedure, you may not have a normal bowel movement for a few days.  Please Note:  You might notice some irritation and congestion in your nose or some drainage.  This is from the oxygen used during your procedure.  There is no need for concern and it should clear up in a day or so.  SYMPTOMS TO REPORT IMMEDIATELY:   Following upper endoscopy (EGD)  Vomiting of blood or coffee ground material  New chest pain or pain under the shoulder blades  Painful or persistently difficult swallowing  New shortness of breath  Fever of 100F or higher  Black, tarry-looking stools  For urgent or emergent issues, a gastroenterologist can be reached at any hour by calling [phone].   DIET:  We do recommend a small meal at first, but then you may proceed to your regular diet.  Drink plenty of fluids but you should avoid alcoholic beverages for 24 hours.  ACTIVITY:  You should plan to take it easy for the rest of today and you should NOT DRIVE or use heavy machinery until tomorrow (because of the sedation medicines used during the test).    FOLLOW UP: Our staff will call the number listed on your records the next business day following your procedure to check on you and address any questions or concerns that you may have regarding the information given to you following your procedure. If we do not reach you, we will leave a message.  However, if you are feeling well and you are not experiencing any problems, there is no need to return our call.  We will assume that you have returned to your regular daily activities without incident.  If any biopsies were taken you will be contacted by phone or by  letter within the next 1-3 weeks.  Please call us at [phone] if you have not heard about the biopsies in 3 weeks.   Thank you for allow [place] to provide for your healthcare needs today.  SIGNATURES/CONFIDENTIALITY: You and/or your care partner have signed paperwork which will be entered into your electronic medical record.  These signatures attest to the fact that that the information above on your After Visit Summary has been reviewed and is understood.  Full responsibility of the confidentiality of this discharge information lies with you and/or your care-partner.

## 2016-04-06 NOTE — Progress Notes (Signed)
Spontaneous respirations throughout. VSS. Resting comfortably. To PACU on room air. Report to  Sara RN. 

## 2016-04-06 NOTE — Op Note (Signed)
[facility] Patient Name: Tonya Mckenzie Procedure Date: 04/06/2016 9:54 AM MRN: [id number] Endoscopist: Mallie Mussel L. Loletha Carrow , MD Age: 56 Referring MD:  Date of Birth: 01/03/1961 Gender: Female Account #: [id number] Procedure:                Upper GI endoscopy Indications:              Epigastric abdominal pain, Abdominal pain in the                            left upper quadrant Medicines:                Monitored Anesthesia Care Procedure:                Pre-Anesthesia Assessment:                           - Prior to the procedure, a History and Physical                            was performed, and patient medications and                            allergies were reviewed. The patient's tolerance of                            previous anesthesia was also reviewed. The risks                            and benefits of the procedure and the sedation                            options and risks were discussed with the patient.                            All questions were answered, and informed consent                            was obtained. Prior Anticoagulants: The patient has                            taken no previous anticoagulant or antiplatelet                            agents. ASA Grade Assessment: II - A patient with                            mild systemic disease. After reviewing the risks                            and benefits, the patient was deemed in                            satisfactory condition to undergo the procedure.  After obtaining informed consent, the endoscope was                            passed under direct vision. Throughout the                            procedure, the patient's blood pressure, pulse, and                            oxygen saturations were monitored continuously. The                            Model GIF-HQ190 [id number]) scope was introduced                            through the mouth, and advanced to the  second part                            of duodenum. The upper GI endoscopy was                            accomplished without difficulty. The patient                            tolerated the procedure well. Scope In: Scope Out: Findings:                 The esophagus was normal.                           Striped mildly erythematous mucosa without bleeding                            was found in the gastric antrum. Biopsies were                            taken with a cold forceps for histology.                           The cardia and gastric fundus were normal on                            retroflexion.                           The examined duodenum was normal. Complications:            No immediate complications. Estimated Blood Loss:     Estimated blood loss: none. Impression:               - Normal esophagus.                           - Erythematous mucosa in the antrum. Biopsied.                           - Normal examined duodenum.  The character of this pain and lack of significant                            endoscopic findings suggest that this pain is not                            likely digestive in origin. Recommendation:           - Patient has a contact number available for                            emergencies. The signs and symptoms of potential                            delayed complications were discussed with the                            patient. Return to normal activities tomorrow.                            Written discharge instructions were provided to the                            patient.                           - Resume previous diet.                           - Resume previous diet.                           - Continue present medications.                           - Await pathology results.                           - Return to referring physician. Leevon Upperman L. Loletha Carrow, MD 04/06/2016 10:14:36 AM This report has been signed  electronically.

## 2016-04-06 NOTE — Progress Notes (Signed)
Called to room to assist during endoscopic procedure.  Patient ID and intended procedure confirmed with present staff. Received instructions for my participation in the procedure from the performing physician.  

## 2016-04-06 NOTE — Progress Notes (Signed)
Reported to Cira Servant ,CRNA that pt has multiple ear,brow piercings and one left nose piercing that pt says does not come out. Explained risk of backing to nose ring becoming dislodged and possible to go down her airway possibly into lungs if a nasal airway were to have to be used the. Patient understood and agreed to leave nose ring in for procedure.Marland Kitchen

## 2016-04-07 ENCOUNTER — Telehealth: Payer: Self-pay

## 2016-04-07 NOTE — Telephone Encounter (Signed)
  Follow up Call-  Call back number 04/06/2016 05/09/2014  Post procedure Call Back phone  # [phone] cell  Permission to leave phone message Yes Yes  Some recent data might be hidden     Patient questions:  Do you have a fever, pain , or abdominal swelling? No. Pain Score  0 *  Have you tolerated food without any problems? Yes.    Have you been able to return to your normal activities? Yes.    Do you have any questions about your discharge instructions: Diet   No. Medications  No. Follow up visit  No.  Do you have questions or concerns about your Care? No.  Actions: * If pain score is 4 or above: No action needed, pain <4.

## 2016-04-12 ENCOUNTER — Encounter: Payer: Self-pay | Admitting: Gastroenterology

## 2016-04-13 ENCOUNTER — Ambulatory Visit: Payer: Medicaid Other | Admitting: Podiatry

## 2016-05-13 ENCOUNTER — Encounter (INDEPENDENT_AMBULATORY_CARE_PROVIDER_SITE_OTHER): Payer: Medicaid Other | Admitting: Podiatry

## 2016-05-13 NOTE — Progress Notes (Signed)
This encounter was created in error - please disregard.

## 2016-06-08 NOTE — Telephone Encounter (Signed)
Done

## 2016-06-14 ENCOUNTER — Encounter: Payer: Self-pay | Admitting: Podiatry

## 2016-06-14 ENCOUNTER — Ambulatory Visit (INDEPENDENT_AMBULATORY_CARE_PROVIDER_SITE_OTHER): Payer: Medicaid Other | Admitting: Podiatry

## 2016-06-14 ENCOUNTER — Ambulatory Visit (INDEPENDENT_AMBULATORY_CARE_PROVIDER_SITE_OTHER): Payer: Medicaid Other

## 2016-06-14 ENCOUNTER — Telehealth: Payer: Self-pay | Admitting: *Deleted

## 2016-06-14 DIAGNOSIS — M2012 Hallux valgus (acquired), left foot: Secondary | ICD-10-CM

## 2016-06-14 DIAGNOSIS — R600 Localized edema: Secondary | ICD-10-CM

## 2016-06-14 DIAGNOSIS — Z9889 Other specified postprocedural states: Secondary | ICD-10-CM

## 2016-06-14 DIAGNOSIS — M84372P Stress fracture, left ankle, subsequent encounter for fracture with malunion: Secondary | ICD-10-CM | POA: Diagnosis not present

## 2016-06-14 DIAGNOSIS — M84375A Stress fracture, left foot, initial encounter for fracture: Secondary | ICD-10-CM

## 2016-06-14 DIAGNOSIS — M79672 Pain in left foot: Secondary | ICD-10-CM

## 2016-06-14 NOTE — Telephone Encounter (Addendum)
Pt states she forgot to ask for pain medication, and if one was the worst pain and ten the least she is a one. Pt states the tramadol she had left doesn't touch the pain.06/15/2016-I informed pt of Dr. Amalia Hailey orders and she states understanding.

## 2016-06-15 NOTE — Telephone Encounter (Signed)
Please let her know we can discuss pain management on next visit. Hold for now.  Thanks, Dr. Amalia Hailey

## 2016-06-17 ENCOUNTER — Ambulatory Visit: Payer: Medicaid Other | Admitting: Podiatry

## 2016-06-17 MED ORDER — BETAMETHASONE SOD PHOS & ACET 6 (3-3) MG/ML IJ SUSP: 3.0000 mg | Freq: Once | INTRAMUSCULAR | Status: DC

## 2016-06-17 NOTE — Progress Notes (Signed)
   Subjective:  Patient presents today for a new complaint of pain to the dorsal aspect of the left foot. Patient states that approximately one week ago she felt like there was significant amounts of pain which began gradually. Patient states that the foot is tingling. Aggravating factors or walking. Patient recently had surgery 12/12/2015 by Dr. Milinda Pointer for Jake Michaelis bunionectomy left foot. Patient states that she's doing well.    Objective/Physical Exam General: The patient is alert and oriented x3 in no acute distress.  Dermatology: Skin is warm, dry and supple bilateral lower extremities. Negative for open lesions or macerations.  Vascular: Palpable pedal pulses bilaterally. No edema or erythema noted. Capillary refill within normal limits.  Neurological: Epicritic and protective threshold grossly intact bilaterally.   Musculoskeletal Exam: Significant pain on palpation noted to the second metatarsal left foot. There is some moderate edema localized around the dorsal aspect of the left foot.  Radiographic exam: Small stress fracture noted to the midshaft of the second metatarsal left foot without displacement. There is a small cortical disruption noted. Negative for significant callus formation indicating an acute fracture   Assessment: #1  stress fracture second metatarsal left foot   #2 edema left foot #3 status post Keller bunionectomy left foot. Date of surgery 12/12/2015.   Plan of Care:  #1 Patient was evaluated. #2  injection of 0.5 mL Celestone Soluspan injected into the intermetatarsal space of the left foot.  #3 compression anklet dispensed #4 postoperative shoe dispensed   #5 return to clinic in 4 weeks   Edrick Kins, DPM [facility]  Dr. Edrick Kins, [place] [place]                                        [place], [place] [address]                Office [phone]  Fax [phone]

## 2016-07-12 ENCOUNTER — Encounter (HOSPITAL_COMMUNITY): Payer: Self-pay | Admitting: Emergency Medicine

## 2016-07-12 ENCOUNTER — Ambulatory Visit (HOSPITAL_COMMUNITY)
Admission: EM | Admit: 2016-07-12 | Discharge: 2016-07-12 | Disposition: A | Discharge status: Home / Self Care | Payer: Medicaid Other | Attending: Internal Medicine | Admitting: Internal Medicine

## 2016-07-12 ENCOUNTER — Encounter: Payer: Medicaid Other | Admitting: Podiatry

## 2016-07-12 DIAGNOSIS — Z711 Person with feared health complaint in whom no diagnosis is made: Secondary | ICD-10-CM

## 2016-07-12 DIAGNOSIS — R102 Pelvic and perineal pain unspecified side: Secondary | ICD-10-CM

## 2016-07-12 DIAGNOSIS — F17210 Nicotine dependence, cigarettes, uncomplicated: Secondary | ICD-10-CM | POA: Diagnosis not present

## 2016-07-12 DIAGNOSIS — N898 Other specified noninflammatory disorders of vagina: Secondary | ICD-10-CM

## 2016-07-12 DIAGNOSIS — B9689 Other specified bacterial agents as the cause of diseases classified elsewhere: Secondary | ICD-10-CM

## 2016-07-12 DIAGNOSIS — N760 Acute vaginitis: Secondary | ICD-10-CM | POA: Insufficient documentation

## 2016-07-12 DIAGNOSIS — Z79899 Other long term (current) drug therapy: Secondary | ICD-10-CM | POA: Diagnosis not present

## 2016-07-12 DIAGNOSIS — I10 Essential (primary) hypertension: Secondary | ICD-10-CM | POA: Diagnosis not present

## 2016-07-12 LAB — POCT URINALYSIS DIP (DEVICE)
BILIRUBIN URINE: NEGATIVE
GLUCOSE, UA: NEGATIVE mg/dL
Hgb urine dipstick: NEGATIVE
KETONES UR: NEGATIVE mg/dL
LEUKOCYTES UA: NEGATIVE
Nitrite: NEGATIVE
Protein, ur: NEGATIVE mg/dL
SPECIFIC GRAVITY, URINE: 1.02 (ref 1.005–1.030)
UROBILINOGEN UA: 0.2 mg/dL (ref 0.0–1.0)
pH: 5.5 (ref 5.0–8.0)

## 2016-07-12 MED ORDER — CEFTRIAXONE SODIUM 250 MG IJ SOLR
INTRAMUSCULAR | Status: AC
  Filled 2016-07-12: qty 250

## 2016-07-12 MED ORDER — CEFTRIAXONE SODIUM 250 MG IJ SOLR
250.0000 mg | Freq: Once | INTRAMUSCULAR | Status: AC
  Administered 2016-07-12: 250 mg via INTRAMUSCULAR

## 2016-07-12 MED ORDER — METRONIDAZOLE 500 MG PO TABS: 500.0000 mg | ORAL_TABLET | Freq: Two times a day (BID) | ORAL | 0 refills | Status: DC

## 2016-07-12 MED ORDER — AZITHROMYCIN 250 MG PO TABS
ORAL_TABLET | ORAL | Status: AC
  Filled 2016-07-12: qty 4

## 2016-07-12 MED ORDER — STERILE WATER FOR INJECTION IJ SOLN
INTRAMUSCULAR | Status: AC
  Filled 2016-07-12: qty 10

## 2016-07-12 MED ORDER — AZITHROMYCIN 250 MG PO TABS
1000.0000 mg | ORAL_TABLET | Freq: Once | ORAL | Status: AC
  Administered 2016-07-12: 1000 mg via ORAL

## 2016-07-12 MED ORDER — FLUCONAZOLE 150 MG PO TABS: ORAL_TABLET | ORAL | 0 refills | Status: DC

## 2016-07-12 NOTE — ED Triage Notes (Addendum)
Onset of vaginal discharge 4 days ago, odor.  Patient reports condom broke.  Patient reports low abdominal discomfort

## 2016-07-12 NOTE — ED Notes (Signed)
Sent patient to bathroom for a dirty and clean specimen with instructions for obtaining.

## 2016-07-12 NOTE — ED Provider Notes (Signed)
CSN: [id number]     Arrival date & time 07/12/16  1002 History   First MD Initiated Contact with Patient 07/12/16 1046     Chief Complaint  Patient presents with  . Vaginal Discharge   (Consider location/radiation/quality/duration/timing/severity/associated sxs/prior Treatment) 56 year old female states that she had intercourse proximal with 4 days ago, and the condom broke. Since that time she has developed pelvic pain and vaginal discharge. His also complaining of urinary frequency but no dysuria. Has a history of hysterectomy with ovaries remaining.  History of vaginal disease, polysubstance abuse, hypertension and PTSD.      Past Medical History:  Diagnosis Date  . Allergic rhinitis   . Anxiety   . Bacterial vaginosis   . Chronic post-traumatic stress disorder (PTSD)   . Cocaine abuse   . Depression   . ETOH abuse   . GERD (gastroesophageal reflux disease)   . Hyperlipidemia   . Hypertension    off meds due to weight loss  . Marijuana abuse   . Uterine leiomyoma    Past Surgical History:  Procedure Laterality Date  . ABDOMINAL HYSTERECTOMY     partial  . CHOLECYSTECTOMY    . TOE SURGERY    . TONSILLECTOMY    . TYMPANOSTOMY TUBE PLACEMENT     Family History  Problem Relation Age of Onset  . Diabetes Mother   . Hypertension Mother   . Heart attack Father   . Colon cancer Neg Hx   . Esophageal cancer Neg Hx   . Rectal cancer Neg Hx   . Stomach cancer Neg Hx   . Liver cancer Neg Hx    Social History  Substance Use Topics  . Smoking status: Light Tobacco Smoker  . Smokeless tobacco: Never Used     Comment: 1-2 cigarettes per month  . Alcohol use Yes     Comment: socially   OB History    No data available     Review of Systems  Constitutional: Positive for fatigue. Negative for fever.  HENT: Negative.   Respiratory: Negative.   Gastrointestinal: Negative.   Genitourinary: Positive for frequency, pelvic pain, vaginal discharge and vaginal pain.  Negative for dysuria and vaginal bleeding.  Musculoskeletal: Negative.   Neurological: Negative.   Hematological: Negative.   All other systems reviewed and are negative.   Allergies  Patient has no known allergies.  Home Medications   Prior to Admission medications   Medication Sig Start Date End Date Taking? Authorizing Provider  cloNIDine (CATAPRES) 0.2 MG tablet TK 1 T PO  BID 03/25/15  Yes [provider]  pravastatin (PRAVACHOL) 40 MG tablet Take 40 mg by mouth daily.   Yes [provider]  dexlansoprazole (DEXILANT) 60 MG capsule Take 60 mg by mouth daily.    [provider]  fluconazole (DIFLUCAN) 150 MG tablet 1 tab po x 1. May repeat in 72 hours if no improvement 07/12/16   Janne Napoleon, NP  ibuprofen (ADVIL,MOTRIN) 800 MG tablet Take 1 tablet (800 mg total) by mouth every 8 (eight) hours as needed. 08/06/15   Hyatt, Max T, DPM  metroNIDAZOLE (FLAGYL) 500 MG tablet Take 1 tablet (500 mg total) by mouth 2 (two) times daily. X 7 days 07/12/16   Janne Napoleon, NP  traZODone (DESYREL) 100 MG tablet Take 100 mg by mouth at bedtime.    [provider]   Meds Ordered and Administered this Visit   Medications  cefTRIAXone (ROCEPHIN) injection 250 mg (not administered)  azithromycin (ZITHROMAX)  tablet 1,000 mg (not administered)    BP 121/88 (BP Location: Right Arm)   Pulse 66   Temp 98.5 F (36.9 C) (Oral)   Resp 18   SpO2 96%  No data found.   Physical Exam  Constitutional: She is oriented to person, place, and time. She appears well-developed and well-nourished. No distress.  Eyes: EOM are normal.  Neck: Normal range of motion. Neck supple.  Cardiovascular: Normal rate.   Pulmonary/Chest: Effort normal. No respiratory distress.  Genitourinary: Vagina normal.  Genitourinary Comments: No cervix. Status post hysterectomy. There is a thin gray discharge coating the vaginal walls and deeper within the vaginal vault. Tenderness to the proximal  vaginal cuff. Bimanual reveals tenderness to the right adnexa, not the left.  Musculoskeletal: She exhibits no edema.  Neurological: She is alert and oriented to person, place, and time. She exhibits normal muscle tone.  Skin: Skin is warm and dry.  Psychiatric: She has a normal mood and affect.  Nursing note and vitals reviewed.   Urgent Care Course     Procedures (including critical care time)  Labs Review Labs Reviewed  POCT URINALYSIS DIP (DEVICE)  CERVICOVAGINAL ANCILLARY ONLY    Imaging Review No results found.   Visual Acuity Review  Right Eye Distance:   Left Eye Distance:   Bilateral Distance:    Right Eye Near:   Left Eye Near:    Bilateral Near:         MDM   1. BV (bacterial vaginosis)   2. Vaginal discharge   3. Pelvic pain in female   4. Concern about STD in female without diagnosis   Milford Cage, RN present during pelvic exam You are being treated for several types of infection such as bacterial vaginosis, chlamydia, gonorrhea and trichomonas. Start taking the prescription medicine soon. If you are not getting better follow-up with your primary care doctor or if worse may return. Meds ordered this encounter  Medications  . cefTRIAXone (ROCEPHIN) injection 250 mg  . azithromycin (ZITHROMAX) tablet 1,000 mg  . metroNIDAZOLE (FLAGYL) 500 MG tablet    Sig: Take 1 tablet (500 mg total) by mouth 2 (two) times daily. X 7 days    Dispense:  14 tablet    Refill:  0    Order Specific Question:   Supervising Provider    Answer:   Sherlene Shams [[id number]]  . fluconazole (DIFLUCAN) 150 MG tablet    Sig: 1 tab po x 1. May repeat in 72 hours if no improvement    Dispense:  2 tablet    Refill:  0    Order Specific Question:   Supervising Provider    Answer:   Sherlene Shams [[id number]]       Janne Napoleon, NP 07/12/16 1113

## 2016-07-12 NOTE — Discharge Instructions (Signed)
You are being treated for several types of infection such as bacterial vaginosis, chlamydia, gonorrhea and trichomonas. Start taking the prescription medicine soon. If you are not getting better follow-up with your primary care doctor or if worse may return.

## 2016-07-13 LAB — CERVICOVAGINAL ANCILLARY ONLY
BACTERIAL VAGINITIS: POSITIVE — AB
Candida vaginitis: NEGATIVE
Chlamydia: NEGATIVE
Neisseria Gonorrhea: NEGATIVE
TRICH (WINDOWPATH): NEGATIVE

## 2016-07-18 NOTE — Progress Notes (Signed)
This encounter was created in error - please disregard.

## 2016-07-21 ENCOUNTER — Ambulatory Visit: Payer: Medicaid Other | Admitting: Podiatry

## 2016-07-29 ENCOUNTER — Ambulatory Visit: Payer: Medicaid Other | Admitting: Podiatry

## 2016-09-23 ENCOUNTER — Other Ambulatory Visit: Payer: Self-pay | Admitting: Internal Medicine

## 2016-09-23 DIAGNOSIS — Z1231 Encounter for screening mammogram for malignant neoplasm of breast: Secondary | ICD-10-CM

## 2016-10-06 ENCOUNTER — Ambulatory Visit (INDEPENDENT_AMBULATORY_CARE_PROVIDER_SITE_OTHER): Payer: Medicaid Other

## 2016-10-06 ENCOUNTER — Ambulatory Visit (INDEPENDENT_AMBULATORY_CARE_PROVIDER_SITE_OTHER): Payer: Medicaid Other | Admitting: Podiatry

## 2016-10-06 DIAGNOSIS — G5792 Unspecified mononeuropathy of left lower limb: Secondary | ICD-10-CM

## 2016-10-06 DIAGNOSIS — M79672 Pain in left foot: Secondary | ICD-10-CM

## 2016-10-06 DIAGNOSIS — Z9889 Other specified postprocedural states: Secondary | ICD-10-CM

## 2016-10-06 MED ORDER — GABAPENTIN 100 MG PO CAPS: 100.0000 mg | ORAL_CAPSULE | Freq: Two times a day (BID) | ORAL | 0 refills | Status: DC

## 2016-10-06 MED ORDER — MELOXICAM 15 MG PO TABS: 15.0000 mg | ORAL_TABLET | Freq: Every day | ORAL | 1 refills | Status: AC

## 2016-10-12 NOTE — Progress Notes (Signed)
   HPI: Patient presents today for follow-up evaluation treatment of a stress fracture to the second metatarsal left foot as well as mild edema. Patient underwent Keller bunionectomy to the left foot deformity Dr. Milinda Pointer on 12/12/2015. Patient was last seen here in the office on 06/14/2016. Patient states that today she is having pain all over her foot with electricity/pins and needles. Patient states that she is unable to work due to the foot pain.   Physical Exam: General: The patient is alert and oriented x3 in no acute distress.  Dermatology: Skin is warm, dry and supple bilateral lower extremities. Negative for open lesions or macerations.  Vascular: Palpable pedal pulses bilaterally. No edema or erythema noted. Capillary refill within normal limits.  Neurological: There is some paresthesia with pins and needle sensation with light touch to the dorsum of the left foot. Epicritic and protective threshold grossly intact bilaterally.   Musculoskeletal Exam: Range of motion within normal limits to all pedal and ankle joints bilateral. Muscle strength 5/5 in all groups bilateral.   Radiographic Exam:  Normal osseous mineralization. Joint spaces preserved. No fracture/dislocation/boney destruction.  Stress fracture noted to the second metatarsal left foot diagnosed on 06/14/2016 appears to be resolved. No cortical disruptions noted  Assessment: 1. Neuritis left foot   Plan of Care:  1. Patient was evaluated. X-rays reviewed today 2. Today were going to provide the patient prescription for gabapentin 100 mg 3 times a day 3. Prescription for meloxicam 15 mg daily 4. Compression anklet was dispensed for left lower extremity mild edema 5. Return to clinic in 4 weeks with Dr. Milinda Pointer. The patient requests her next appointment with Dr. Milinda Pointer   Edrick Kins, DPM [facility]  Dr. Edrick Kins, DPM    [address], [place]  [address]                Office [phone]  Fax [phone]

## 2016-10-14 ENCOUNTER — Encounter: Payer: Self-pay | Admitting: Podiatry

## 2016-10-14 NOTE — Progress Notes (Signed)
Pt's note from 06 October 2016 was put up front to be mailed out to her per her signed medical records release form. [place]

## 2016-11-04 ENCOUNTER — Ambulatory Visit (INDEPENDENT_AMBULATORY_CARE_PROVIDER_SITE_OTHER): Payer: Self-pay | Admitting: Podiatry

## 2016-11-04 DIAGNOSIS — G5752 Tarsal tunnel syndrome, left lower limb: Secondary | ICD-10-CM

## 2016-11-04 NOTE — Progress Notes (Signed)
She presents today with chief complaint of numbness that continues with electrical type sensations along the left leg and foot. Date of surgery was October of last year. She states that she has a lot of radiating pain from her foot of her leg from her leg down to her foot. She denies any trauma.  Objective: Vital signs are stable she is alert and oriented 3 palpable pain along the tarsal tunnel. She has positive Tinel sign. She also has a positive straight leg. She also has pain on palpation of the common peroneal nerve as it courses over the fibular neck. The surgical foot itself appears to be doing very well she has great range of motion of the first metatarsophalangeal joint status post Silastic implant. Radiographs confirmed this today.  Assessment: Tarsal tunnel syndrome cannot rule out a radiculopathy.  Plan: Sending her for an EMG and nerve conduction velocity exam. Follow-up with her once those have been performed. If these are positive since to neurosurgery.

## 2016-11-05 ENCOUNTER — Ambulatory Visit: Payer: Medicaid Other

## 2016-11-05 ENCOUNTER — Telehealth: Payer: Self-pay | Admitting: *Deleted

## 2016-11-05 DIAGNOSIS — M792 Neuralgia and neuritis, unspecified: Secondary | ICD-10-CM

## 2016-11-05 NOTE — Telephone Encounter (Addendum)
-----   Message from [place] sent at 11/04/2016  1:41 PM EDT ----- Regarding: Neuro Referral for [place] for left - neuritis. 11/05/2016-Faxed orders to [facility].

## 2016-11-10 ENCOUNTER — Ambulatory Visit: Payer: Self-pay

## 2016-11-11 ENCOUNTER — Ambulatory Visit (INDEPENDENT_AMBULATORY_CARE_PROVIDER_SITE_OTHER): Payer: Medicaid Other | Admitting: Neurology

## 2016-11-11 DIAGNOSIS — M792 Neuralgia and neuritis, unspecified: Secondary | ICD-10-CM

## 2016-11-11 NOTE — Procedures (Signed)
[facility]  [place], [address]  [place], [place] [address] Tel: [phone] Fax:  [phone] Test Date:  11/11/2016  Patient: Tonya Mckenzie DOB: 01/14/1961 Physician: Narda Amber, DO  Sex: Female Height: 5\' 6"  Ref Phys: Max T. [place], [place]  ID#: [id number] Temp: 33.0C Technician:    Patient Complaints: This is a 56 year-old female with history of left foot surgery referred for evaluation of left foot painful paresthesias.  NCV & EMG Findings: Extensive electrodiagnostic testing of the left lower extremity shows:  1. Left sural, superficial peroneal, and left lateral plantar response is within normal limits. The left medial plantar response is mildly reduced and normal finding for patient's age. 2. Left peroneal and tibial motor responses are within normal limits. 3. Left tibial H reflex study is within normal limits. 4. There is no evidence of active or chronic motor axon loss changes affecting any of the tested muscles. Motor unit configuration and recruitment pattern is within normal limits.  Impression: This is a normal study of the left lower extremity.   In particular, there is no evidence of tarsal tunnel syndrome, sensorimotor polyneuropathy, or lumbosacral radiculopathy.   ___________________________ Narda Amber, DO    Nerve Conduction Studies Anti Sensory Summary Table   Stim Site NR Peak (ms) Norm Peak (ms) P-T Amp (V) Norm P-T Amp  Left Sup Peroneal Anti Sensory (Ant Lat Mall)  12 cm    2.8 <4.6 11.6 >4  Left Sural Anti Sensory (Lat Mall)  Calf    2.9 <4.6 19.2 >4   Motor Summary Table   Stim Site NR Onset (ms) Norm Onset (ms) O-P Amp (mV) Norm O-P Amp Site1 Site2 Delta-0 (ms) Dist (cm) Vel (m/s) Norm Vel (m/s)  Left Peroneal Motor (Ext Dig Brev)  Ankle    3.4 <6.0 3.7 >2.5 B Fib Ankle 7.8 35.0 45 >40  B Fib    11.2  3.5  Poplt B Fib 1.3 7.0 54 >40  Poplt    12.5  3.3         Left Peroneal TA Motor (Tib Ant)  Fib Head    3.2 <4.5 6.0 >3  Poplit Fib Head 1.7 8.0 47 >40  Poplit    4.9  5.6         Left Tibial Motor (Abd Hall Brev)  Ankle    4.4 <6.0 9.2 >4 Knee Ankle 8.9 39.0 44 >40  Knee    13.3  7.6         Left Tibial (ADQP) Motor (ADQP)  Ankle    4.2 <6.5 3.0 >3         Mixed Summary Table   Stim Site NR Peak (ms) Norm Peak (ms) P-T Amp (V) Norm P-T Amp  Left Lateral Plantar Mixed (Med Malleolus)  Lateral Foot    2.8 <3.7 8.6 >8  Left Medial Plantar Mixed (Med Malleolus)  Medial Foot    2.7 <3.7 5.8 >8   H Reflex Studies   NR H-Lat (ms) Lat Norm (ms) L-R H-Lat (ms)  Left Tibial (Gastroc)     32.24 <35    EMG   Side Muscle Ins Act Fibs Psw Fasc Number Recrt Dur Dur. Amp Amp. Poly Poly. Comment  Left AntTibialis Nml Nml Nml Nml Nml Nml Nml Nml Nml Nml Nml Nml N/A  Left Gastroc Nml Nml Nml Nml Nml Nml Nml Nml Nml Nml Nml Nml N/A  Left Flex Dig Long Nml Nml Nml Nml Nml Nml Nml Nml Nml  Nml Nml Nml N/A  Left RectFemoris Nml Nml Nml Nml Nml Nml Nml Nml Nml Nml Nml Nml N/A  Left GluteusMed Nml Nml Nml Nml Nml Nml Nml Nml Nml Nml Nml Nml N/A  Left BicepsFemS Nml Nml Nml Nml Nml Nml Nml Nml Nml Nml Nml Nml N/A      Waveforms:

## 2016-11-30 ENCOUNTER — Telehealth: Payer: Self-pay | Admitting: *Deleted

## 2016-11-30 ENCOUNTER — Encounter: Payer: Self-pay | Admitting: Neurology

## 2016-11-30 NOTE — Telephone Encounter (Addendum)
Pt called for results of neurology test. 12/01/2016-I informed pt of Dr. Stephenie Acres review of results and orders. Pt states she has disability papers to discuss, and I told pt to make her appt for a Thursday and she could also discuss her paperwork with Shelda Altes.

## 2016-12-01 NOTE — Telephone Encounter (Signed)
If you would look in the notes from the study.  It was routed back to you that the study was normal and to have her in.  That was on 11/11/16

## 2016-12-14 ENCOUNTER — Ambulatory Visit (INDEPENDENT_AMBULATORY_CARE_PROVIDER_SITE_OTHER): Payer: Medicaid Other | Admitting: Podiatry

## 2016-12-14 ENCOUNTER — Encounter: Payer: Self-pay | Admitting: Podiatry

## 2016-12-14 DIAGNOSIS — M792 Neuralgia and neuritis, unspecified: Secondary | ICD-10-CM | POA: Diagnosis not present

## 2016-12-14 MED ORDER — GABAPENTIN 300 MG PO CAPS: 300.0000 mg | ORAL_CAPSULE | Freq: Two times a day (BID) | ORAL | 3 refills | Status: DC

## 2016-12-14 NOTE — Progress Notes (Signed)
She presents today for follow-up of her pain in her feet and legs. She states this primarily left foot and radiates out my toes and up my leg. She states is coming from an here she points to the medial ankle. She states that when she takes her gabapentin it makes her feel somewhat better but not a lot. She also states that she is here for her nerve conduction velocity exam results.  Objective: Vital signs stable alert and oriented 3 she still has pain on palpation of the tarsal tunnel with radiating pains proximally and distally. Nerve conduction velocity exam reveals negative findings.  Assessment: Tarsal tunnel most likely though NCV findings are negative.  Plan: I offered her injection today however she declined so I increased her gabapentin 300 mg twice daily follow-up with her in 6 weeks.

## 2016-12-28 ENCOUNTER — Ambulatory Visit: Payer: Medicaid Other | Admitting: Podiatry

## 2016-12-29 ENCOUNTER — Ambulatory Visit: Payer: Self-pay

## 2017-01-06 ENCOUNTER — Other Ambulatory Visit: Payer: Self-pay | Admitting: Podiatry

## 2017-01-25 ENCOUNTER — Ambulatory Visit: Payer: Medicaid Other | Admitting: Podiatry

## 2017-01-25 ENCOUNTER — Ambulatory Visit
Admission: RE | Admit: 2017-01-25 | Discharge: 2017-01-25 | Disposition: A | Discharge status: Home / Self Care | Payer: Medicaid Other | Source: Ambulatory Visit | Attending: Internal Medicine | Admitting: Internal Medicine

## 2017-01-25 ENCOUNTER — Encounter: Payer: Self-pay | Admitting: Podiatry

## 2017-01-25 DIAGNOSIS — Z1231 Encounter for screening mammogram for malignant neoplasm of breast: Secondary | ICD-10-CM

## 2017-01-25 DIAGNOSIS — M792 Neuralgia and neuritis, unspecified: Secondary | ICD-10-CM

## 2017-01-25 MED ORDER — GABAPENTIN 300 MG PO CAPS: ORAL_CAPSULE | ORAL | 3 refills | Status: DC

## 2017-01-25 NOTE — Progress Notes (Signed)
She presents today for follow-up of her neuritis in her left leg. She states that this started approximately 3 months after surgery to her left first metatarsophalangeal joint. She had already recovered with no problems. She developed pain and numbness and tingling that radiates from her foot up to her thigh. She states that she has pins and needles and night time from her left leg into her foot. She states that she has pain in her left lower back. Nerve conduction velocity exam was negative to the left side.  Objective: Vital signs are stable alert and oriented 3. Pulses are palpable. No change in neurovascular status.  Assessment: Neuritis radiculopathy some type of spinal issue or sciatic issue such as her form of syndrome is possible because the symptoms fit. However it is very unlikely to see these in the face of the negative nerve conduction test.  Plan: I requested she be followed up by [facility] neurology. Should this yielded a negative result we will consider [place] [facility]. I also am going to increase her nighttime gabapentin 600 mg so that she can at least get some rest. We will continue with 300 mg during the day.

## 2017-01-26 ENCOUNTER — Telehealth: Payer: Self-pay | Admitting: Podiatry

## 2017-01-26 ENCOUNTER — Encounter: Payer: Self-pay | Admitting: Neurology

## 2017-01-26 DIAGNOSIS — M792 Neuralgia and neuritis, unspecified: Secondary | ICD-10-CM

## 2017-01-26 NOTE — Telephone Encounter (Signed)
Referral, clinicals and demographics faxed to  Neurology. 

## 2017-01-26 NOTE — Telephone Encounter (Signed)
Called and left pt a voicemail letting her know that her requested medical records are ready at the front desk for her to pick up at her convenience and that if she had any other questions or needed anything else, to call our office at [phone].

## 2017-01-26 NOTE — Telephone Encounter (Signed)
-----   Message from Rip Harbour, [facility] sent at 01/25/2017  1:36 PM EST ----- Regarding: Neuro Patient has been for nerve conduction, she now needs a consult appointment

## 2017-01-27 ENCOUNTER — Encounter: Payer: Self-pay | Admitting: Podiatry

## 2017-01-27 NOTE — Progress Notes (Signed)
Pt came in and picked up requested medical records at check in with [facility].

## 2017-03-04 ENCOUNTER — Other Ambulatory Visit: Payer: Self-pay | Admitting: Podiatry

## 2017-03-30 ENCOUNTER — Other Ambulatory Visit: Payer: Self-pay | Admitting: Podiatry

## 2017-03-31 NOTE — Telephone Encounter (Signed)
I told pt I would refill the Gabapentin as previously to discuss the Gabapentin with Dr. Milinda Pointer at the 04/2017 appt and let him know you are being seen by Dr. Posey Pronto in 04/2017.

## 2017-04-26 ENCOUNTER — Ambulatory Visit: Payer: Medicaid Other | Admitting: Podiatry

## 2017-05-02 ENCOUNTER — Ambulatory Visit: Payer: Medicaid Other | Admitting: Neurology

## 2017-05-02 NOTE — Progress Notes (Deleted)
[facility] Clinic Note - Initial Visit   Date: 05/02/17  Tonya Mckenzie MRN: [id number] DOB: 03-02-1960   Dear Dr Tonya Ebbs, MD:  Thank you for your kind referral of Tonya Mckenzie for consultation of ***. Although *** history is well known to you, please allow [place] to reiterate it for the purpose of our medical record. The patient was accompanied to the clinic by *** who also provides collateral information.     History of Present Illness: Tonya Mckenzie is a 57 y.o. ***-handed Caucasian/*** female with hyperlipidemia, hyperension *** presenting for evaluation of ***.    3 months after surgery to her left first metatarsophalangeal joint. She had already recovered with no problems. She developed pain and numbness and tingling that radiates from her foot up to her thigh. She states that she has pins and needles and night time from her left leg into her foot. She states that she has pain in her left lower back. Nerve conduction velocity exam was negative to the left side.   Out-side paper records, electronic medical record, and images have been reviewed where available and summarized as: *** NCS/EMG of the left leg 11/11/2016: This is a normal study of the left lower extremity.  In particular, there is no evidence of tarsal tunnel syndrome, sensorimotor polyneuropathy, or lumbosacral radiculopathy.   Past Medical History:  Diagnosis Date  . Allergic rhinitis   . Anxiety   . Bacterial vaginosis   . Chronic post-traumatic stress disorder (PTSD)   . Cocaine abuse ([place])   . Depression   . ETOH abuse   . GERD (gastroesophageal reflux disease)   . Hyperlipidemia   . Hypertension    off meds due to weight loss  . Marijuana abuse   . Uterine leiomyoma     Past Surgical History:  Procedure Laterality Date  . ABDOMINAL HYSTERECTOMY     partial  . CHOLECYSTECTOMY    . TOE SURGERY    . TONSILLECTOMY    . TYMPANOSTOMY TUBE PLACEMENT        Medications:  Outpatient Encounter Medications as of 05/02/2017  Medication Sig Note  . cloNIDine (CATAPRES) 0.2 MG tablet TK 1 T PO  BID 04/24/2015: Received from: External Pharmacy  . dexlansoprazole (DEXILANT) 60 MG capsule Take 60 mg by mouth daily.   . fluconazole (DIFLUCAN) 150 MG tablet 1 tab po x 1. May repeat in 72 hours if no improvement   . gabapentin (NEURONTIN) 300 MG capsule TAKE 1 CAPSULE BY MOUTH TWICE A DAY   . ibuprofen (ADVIL,MOTRIN) 800 MG tablet Take 1 tablet (800 mg total) by mouth every 8 (eight) hours as needed.   . meloxicam (MOBIC) 15 MG tablet TAKE 1 TABLET BY MOUTH DAILY   . metroNIDAZOLE (FLAGYL) 500 MG tablet Take 1 tablet (500 mg total) by mouth 2 (two) times daily. X 7 days   . pravastatin (PRAVACHOL) 40 MG tablet Take 40 mg by mouth daily.   . traZODone (DESYREL) 100 MG tablet Take 100 mg by mouth at bedtime.    Facility-Administered Encounter Medications as of 05/02/2017  Medication  . 0.9 %  sodium chloride infusion  . betamethasone acetate-betamethasone sodium phosphate (CELESTONE) injection 3 mg     Allergies: No Known Allergies  Family History: Family History  Problem Relation Age of Onset  . Diabetes Mother   . Hypertension Mother   . Heart attack Father   . Colon cancer Neg Hx   . Esophageal cancer Neg  Hx   . Rectal cancer Neg Hx   . Stomach cancer Neg Hx   . Liver cancer Neg Hx     Social History: Social History   Tobacco Use  . Smoking status: Light Tobacco Smoker  . Smokeless tobacco: Never Used  . Tobacco comment: 1-2 cigarettes per month  Substance Use Topics  . Alcohol use: Yes    Comment: socially  . Drug use: No    Comment: reformed went through rehab   Social History   Social History Narrative  . Not on file    Review of Systems:  CONSTITUTIONAL: No fevers, chills, night sweats, or weight loss.  *** EYES: No visual changes or eye pain ENT: No hearing changes.  No history of nose bleeds.   RESPIRATORY: No  cough, wheezing and shortness of breath.   CARDIOVASCULAR: Negative for chest pain, and palpitations.   GI: Negative for abdominal discomfort, blood in stools or black stools.  No recent change in bowel habits.   GU:  No history of incontinence.   MUSCLOSKELETAL: No history of joint pain or swelling.  No myalgias.   SKIN: Negative for lesions, rash, and itching.   HEMATOLOGY/ONCOLOGY: Negative for prolonged bleeding, bruising easily, and swollen nodes.  No history of cancer.   ENDOCRINE: Negative for cold or heat intolerance, polydipsia or goiter.   PSYCH:  ***depression or anxiety symptoms.   NEURO: As Above.   Vital Signs:  There were no vitals taken for this visit. Pain Scale: *** on a scale of 0-10   General Medical Exam:  *** General:  Well appearing, comfortable.   Eyes/ENT: see cranial nerve examination.   Neck: No masses appreciated.  Full range of motion without tenderness.  No carotid bruits. Respiratory:  Clear to auscultation, good air entry bilaterally.   Cardiac:  Regular rate and rhythm, no murmur.   Extremities:  No deformities, edema, or skin discoloration.  Skin:  No rashes or lesions.  Neurological Exam: MENTAL STATUS including orientation to time, place, person, recent and remote memory, attention span and concentration, language, and fund of knowledge is ***normal.  Speech is not dysarthric.  CRANIAL NERVES: II:  No visual field defects.  Unremarkable fundi.   III-IV-VI: Pupils equal round and reactive to light.  Normal conjugate, extra-ocular eye movements in all directions of gaze.  No nystagmus.  No ptosis***.   V:  Normal facial sensation.  Jaw jerk is ***.   VII:  Normal facial symmetry and movements.  No pathologic facial reflexes.  VIII:  Normal hearing and vestibular function.   IX-X:  Normal palatal movement.   XI:  Normal shoulder shrug and head rotation.   XII:  Normal tongue strength and range of motion, no deviation or fasciculation.  MOTOR:  No  atrophy, fasciculations or abnormal movements.  No pronator drift.  Tone is normal.    Right Upper Extremity:    Left Upper Extremity:    Deltoid  5/5   Deltoid  5/5   Biceps  5/5   Biceps  5/5   Triceps  5/5   Triceps  5/5   Wrist extensors  5/5   Wrist extensors  5/5   Wrist flexors  5/5   Wrist flexors  5/5   Finger extensors  5/5   Finger extensors  5/5   Finger flexors  5/5   Finger flexors  5/5   Dorsal interossei  5/5   Dorsal interossei  5/5   Abductor pollicis  5/5  Abductor pollicis  5/5   Tone (Ashworth scale)  0  Tone (Ashworth scale)  0   Right Lower Extremity:    Left Lower Extremity:    Hip flexors  5/5   Hip flexors  5/5   Hip extensors  5/5   Hip extensors  5/5   Knee flexors  5/5   Knee flexors  5/5   Knee extensors  5/5   Knee extensors  5/5   Dorsiflexors  5/5   Dorsiflexors  5/5   Plantarflexors  5/5   Plantarflexors  5/5   Toe extensors  5/5   Toe extensors  5/5   Toe flexors  5/5   Toe flexors  5/5   Tone (Ashworth scale)  0  Tone (Ashworth scale)  0   MSRs:  Right                                                                 Left brachioradialis 2+  brachioradialis 2+  biceps 2+  biceps 2+  triceps 2+  triceps 2+  patellar 2+  patellar 2+  ankle jerk 2+  ankle jerk 2+  Hoffman no  Hoffman no  plantar response down  plantar response down   SENSORY:  Normal and symmetric perception of light touch, pinprick, vibration, and proprioception.  Romberg's sign absent.   COORDINATION/GAIT: Normal finger-to- nose-finger and heel-to-shin.  Intact rapid alternating movements bilaterally.  Able to rise from a chair without using arms.  Gait narrow based and stable. Tandem and stressed gait intact.    IMPRESSION: ***  PLAN/RECOMMENDATIONS:  *** Return to clinic in *** months.   The duration of this appointment visit was *** minutes of face-to-face time with the patient.  Greater than 50% of this time was spent in counseling, explanation of diagnosis,  planning of further management, and coordination of care.   Thank you for allowing me to participate in patient's care.  If I can answer any additional questions, I would be pleased to do so.    Sincerely,    Donika K. Posey Pronto, DO

## 2017-06-02 ENCOUNTER — Other Ambulatory Visit (HOSPITAL_COMMUNITY)
Admission: RE | Admit: 2017-06-02 | Discharge: 2017-06-02 | Disposition: A | Discharge status: Home / Self Care | Payer: Medicaid Other | Source: Ambulatory Visit | Attending: Family Medicine | Admitting: Family Medicine

## 2017-06-02 ENCOUNTER — Other Ambulatory Visit: Payer: Self-pay | Admitting: Family Medicine

## 2017-06-02 DIAGNOSIS — N898 Other specified noninflammatory disorders of vagina: Secondary | ICD-10-CM | POA: Insufficient documentation

## 2017-06-06 LAB — URINE CYTOLOGY ANCILLARY ONLY
Candida vaginitis: NEGATIVE
Chlamydia: NEGATIVE
NEISSERIA GONORRHEA: NEGATIVE
TRICH (WINDOWPATH): NEGATIVE

## 2017-06-10 ENCOUNTER — Other Ambulatory Visit: Payer: Self-pay | Admitting: Podiatry

## 2017-07-08 ENCOUNTER — Encounter (HOSPITAL_COMMUNITY): Payer: Self-pay | Admitting: Emergency Medicine

## 2017-07-08 ENCOUNTER — Emergency Department (HOSPITAL_COMMUNITY)
Admission: EM | Admit: 2017-07-08 | Discharge: 2017-07-09 | Disposition: A | Discharge status: Other Acute Inpatient Hospital | Payer: Medicaid Other | Attending: Emergency Medicine | Admitting: Emergency Medicine

## 2017-07-08 DIAGNOSIS — Z79899 Other long term (current) drug therapy: Secondary | ICD-10-CM | POA: Insufficient documentation

## 2017-07-08 DIAGNOSIS — F17210 Nicotine dependence, cigarettes, uncomplicated: Secondary | ICD-10-CM | POA: Insufficient documentation

## 2017-07-08 DIAGNOSIS — R443 Hallucinations, unspecified: Secondary | ICD-10-CM

## 2017-07-08 DIAGNOSIS — Z008 Encounter for other general examination: Secondary | ICD-10-CM

## 2017-07-08 DIAGNOSIS — M84375A Stress fracture, left foot, initial encounter for fracture: Secondary | ICD-10-CM

## 2017-07-08 DIAGNOSIS — R600 Localized edema: Secondary | ICD-10-CM

## 2017-07-08 DIAGNOSIS — F333 Major depressive disorder, recurrent, severe with psychotic symptoms: Secondary | ICD-10-CM | POA: Diagnosis present

## 2017-07-08 DIAGNOSIS — F14159 Cocaine abuse with cocaine-induced psychotic disorder, unspecified: Secondary | ICD-10-CM | POA: Diagnosis present

## 2017-07-08 DIAGNOSIS — R441 Visual hallucinations: Secondary | ICD-10-CM | POA: Insufficient documentation

## 2017-07-08 DIAGNOSIS — I10 Essential (primary) hypertension: Secondary | ICD-10-CM | POA: Insufficient documentation

## 2017-07-08 DIAGNOSIS — R1013 Epigastric pain: Secondary | ICD-10-CM

## 2017-07-08 DIAGNOSIS — F4310 Post-traumatic stress disorder, unspecified: Secondary | ICD-10-CM | POA: Insufficient documentation

## 2017-07-08 DIAGNOSIS — M79672 Pain in left foot: Secondary | ICD-10-CM

## 2017-07-08 DIAGNOSIS — R440 Auditory hallucinations: Secondary | ICD-10-CM | POA: Insufficient documentation

## 2017-07-08 LAB — COMPREHENSIVE METABOLIC PANEL
ALT: 37 U/L (ref 14–54)
AST: 22 U/L (ref 15–41)
Albumin: 2.9 g/dL — ABNORMAL LOW (ref 3.5–5.0)
Alkaline Phosphatase: 60 U/L (ref 38–126)
Anion gap: 9 (ref 5–15)
BILIRUBIN TOTAL: 0.5 mg/dL (ref 0.3–1.2)
BUN: 12 mg/dL (ref 6–20)
CALCIUM: 8.2 mg/dL — AB (ref 8.9–10.3)
CO2: 22 mmol/L (ref 22–32)
CREATININE: 0.96 mg/dL (ref 0.44–1.00)
Chloride: 110 mmol/L (ref 101–111)
Glucose, Bld: 164 mg/dL — ABNORMAL HIGH (ref 65–99)
Potassium: 3.3 mmol/L — ABNORMAL LOW (ref 3.5–5.1)
Sodium: 141 mmol/L (ref 135–145)
TOTAL PROTEIN: 5.2 g/dL — AB (ref 6.5–8.1)

## 2017-07-08 LAB — RAPID URINE DRUG SCREEN, HOSP PERFORMED
AMPHETAMINES: NOT DETECTED
Barbiturates: NOT DETECTED
Benzodiazepines: NOT DETECTED
Cocaine: POSITIVE — AB
Opiates: NOT DETECTED
Tetrahydrocannabinol: POSITIVE — AB

## 2017-07-08 LAB — CBC
HCT: 44.6 % (ref 36.0–46.0)
Hemoglobin: 14.3 g/dL (ref 12.0–15.0)
MCH: 28.8 pg (ref 26.0–34.0)
MCHC: 32.1 g/dL (ref 30.0–36.0)
MCV: 89.9 fL (ref 78.0–100.0)
PLATELETS: 208 10*3/uL (ref 150–400)
RBC: 4.96 MIL/uL (ref 3.87–5.11)
RDW: 13.5 % (ref 11.5–15.5)
WBC: 10.6 10*3/uL — ABNORMAL HIGH (ref 4.0–10.5)

## 2017-07-08 LAB — SALICYLATE LEVEL: Salicylate Lvl: <7 mg/dL (ref 2.8–30.0)

## 2017-07-08 LAB — I-STAT BETA HCG BLOOD, ED (MC, WL, AP ONLY): HCG, QUANTITATIVE: 5.5 m[IU]/mL — AB (ref <5)

## 2017-07-08 LAB — ETHANOL

## 2017-07-08 LAB — ACETAMINOPHEN LEVEL: Acetaminophen (Tylenol), Serum: <10 ug/mL — ABNORMAL LOW (ref 10–30)

## 2017-07-08 MED ORDER — POTASSIUM CHLORIDE CRYS ER 20 MEQ PO TBCR
20.0000 meq | EXTENDED_RELEASE_TABLET | Freq: Once | ORAL | Status: AC
  Administered 2017-07-08: 20 meq via ORAL
  Filled 2017-07-08: qty 1
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
## 2017-07-08 NOTE — Progress Notes (Signed)
TTS consulted with Priscille Loveless, NP who recommends inpt treatment. EDP Maczis, Barth Kirks, PA-C notified of the disposition.  Lind Covert, MSW, LCSW Therapeutic Triage Specialist  [phone]

## 2017-07-09 ENCOUNTER — Inpatient Hospital Stay (HOSPITAL_COMMUNITY)
Admission: AD | Admit: 2017-07-09 | Discharge: 2017-07-11 | DRG: 885 | Disposition: A | Discharge status: Home / Self Care | Payer: Medicaid Other | Source: Intra-hospital | Attending: Psychiatry | Admitting: Psychiatry

## 2017-07-09 ENCOUNTER — Other Ambulatory Visit: Payer: Self-pay

## 2017-07-09 ENCOUNTER — Encounter (HOSPITAL_COMMUNITY): Payer: Self-pay

## 2017-07-09 DIAGNOSIS — Z91419 Personal history of unspecified adult abuse: Secondary | ICD-10-CM | POA: Diagnosis not present

## 2017-07-09 DIAGNOSIS — R4587 Impulsiveness: Secondary | ICD-10-CM

## 2017-07-09 DIAGNOSIS — F1490 Cocaine use, unspecified, uncomplicated: Secondary | ICD-10-CM | POA: Diagnosis not present

## 2017-07-09 DIAGNOSIS — F419 Anxiety disorder, unspecified: Secondary | ICD-10-CM | POA: Diagnosis present

## 2017-07-09 DIAGNOSIS — F4310 Post-traumatic stress disorder, unspecified: Secondary | ICD-10-CM | POA: Diagnosis not present

## 2017-07-09 DIAGNOSIS — F14159 Cocaine abuse with cocaine-induced psychotic disorder, unspecified: Secondary | ICD-10-CM | POA: Diagnosis present

## 2017-07-09 DIAGNOSIS — F333 Major depressive disorder, recurrent, severe with psychotic symptoms: Secondary | ICD-10-CM

## 2017-07-09 DIAGNOSIS — R45851 Suicidal ideations: Secondary | ICD-10-CM | POA: Diagnosis present

## 2017-07-09 DIAGNOSIS — F1290 Cannabis use, unspecified, uncomplicated: Secondary | ICD-10-CM | POA: Diagnosis not present

## 2017-07-09 DIAGNOSIS — Z62819 Personal history of unspecified abuse in childhood: Secondary | ICD-10-CM

## 2017-07-09 DIAGNOSIS — F410 Panic disorder [episodic paroxysmal anxiety] without agoraphobia: Secondary | ICD-10-CM | POA: Diagnosis not present

## 2017-07-09 DIAGNOSIS — F14151 Cocaine abuse with cocaine-induced psychotic disorder with hallucinations: Secondary | ICD-10-CM

## 2017-07-09 DIAGNOSIS — F1099 Alcohol use, unspecified with unspecified alcohol-induced disorder: Secondary | ICD-10-CM | POA: Diagnosis not present

## 2017-07-09 DIAGNOSIS — Z79899 Other long term (current) drug therapy: Secondary | ICD-10-CM | POA: Diagnosis not present

## 2017-07-09 DIAGNOSIS — M549 Dorsalgia, unspecified: Secondary | ICD-10-CM | POA: Diagnosis not present

## 2017-07-09 DIAGNOSIS — I10 Essential (primary) hypertension: Secondary | ICD-10-CM | POA: Diagnosis present

## 2017-07-09 DIAGNOSIS — F332 Major depressive disorder, recurrent severe without psychotic features: Secondary | ICD-10-CM | POA: Diagnosis present

## 2017-07-09 DIAGNOSIS — R450 Nervousness: Secondary | ICD-10-CM | POA: Diagnosis not present

## 2017-07-09 DIAGNOSIS — F1210 Cannabis abuse, uncomplicated: Secondary | ICD-10-CM | POA: Diagnosis present

## 2017-07-09 DIAGNOSIS — Z6332 Other absence of family member: Secondary | ICD-10-CM | POA: Diagnosis not present

## 2017-07-09 DIAGNOSIS — Z720 Tobacco use: Secondary | ICD-10-CM

## 2017-07-09 DIAGNOSIS — Z560 Unemployment, unspecified: Secondary | ICD-10-CM | POA: Diagnosis not present

## 2017-07-09 DIAGNOSIS — F515 Nightmare disorder: Secondary | ICD-10-CM | POA: Diagnosis not present

## 2017-07-09 DIAGNOSIS — K219 Gastro-esophageal reflux disease without esophagitis: Secondary | ICD-10-CM | POA: Diagnosis present

## 2017-07-09 DIAGNOSIS — E785 Hyperlipidemia, unspecified: Secondary | ICD-10-CM | POA: Diagnosis present

## 2017-07-09 DIAGNOSIS — G4700 Insomnia, unspecified: Secondary | ICD-10-CM | POA: Diagnosis not present

## 2017-07-09 DIAGNOSIS — R451 Restlessness and agitation: Secondary | ICD-10-CM | POA: Diagnosis not present

## 2017-07-09 DIAGNOSIS — F4312 Post-traumatic stress disorder, chronic: Secondary | ICD-10-CM | POA: Diagnosis not present

## 2017-07-09 DIAGNOSIS — Z91410 Personal history of adult physical and sexual abuse: Secondary | ICD-10-CM | POA: Diagnosis not present

## 2017-07-09 MED ORDER — PANTOPRAZOLE SODIUM 40 MG PO TBEC
40.0000 mg | DELAYED_RELEASE_TABLET | Freq: Every day | ORAL | Status: DC
  Administered 2017-07-10 – 2017-07-11 (×2): 40 mg via ORAL
  Filled 2017-07-09 (×4): qty 1

## 2017-07-09 MED ORDER — ALUM & MAG HYDROXIDE-SIMETH 200-200-20 MG/5ML PO SUSP: 30.0000 mL | ORAL | Status: DC | PRN

## 2017-07-09 MED ORDER — PRAVASTATIN SODIUM 20 MG PO TABS
40.0000 mg | ORAL_TABLET | Freq: Every day | ORAL | Status: DC
  Administered 2017-07-09: 40 mg via ORAL
  Filled 2017-07-09: qty 2

## 2017-07-09 MED ORDER — TRAZODONE HCL 50 MG PO TABS: 50.0000 mg | ORAL_TABLET | Freq: Every evening | ORAL | Status: DC | PRN

## 2017-07-09 MED ORDER — LORAZEPAM 1 MG PO TABS
1.0000 mg | ORAL_TABLET | Freq: Once | ORAL | Status: AC
  Administered 2017-07-09: 1 mg via ORAL
  Filled 2017-07-09: qty 1

## 2017-07-09 MED ORDER — ACETAMINOPHEN 325 MG PO TABS
650.0000 mg | ORAL_TABLET | Freq: Four times a day (QID) | ORAL | Status: DC | PRN
  Administered 2017-07-09 – 2017-07-10 (×2): 650 mg via ORAL
  Filled 2017-07-09 (×2): qty 2

## 2017-07-09 MED ORDER — PRAVASTATIN SODIUM 40 MG PO TABS
40.0000 mg | ORAL_TABLET | Freq: Every day | ORAL | Status: DC
  Administered 2017-07-10 – 2017-07-11 (×2): 40 mg via ORAL
  Filled 2017-07-09 (×3): qty 1

## 2017-07-09 MED ORDER — CLONIDINE HCL 0.2 MG PO TABS
0.2000 mg | ORAL_TABLET | Freq: Every day | ORAL | Status: DC
  Administered 2017-07-10 – 2017-07-11 (×2): 0.2 mg via ORAL
  Filled 2017-07-09: qty 2
  Filled 2017-07-09 (×3): qty 1

## 2017-07-09 MED ORDER — FLUOXETINE HCL 20 MG PO CAPS
20.0000 mg | ORAL_CAPSULE | Freq: Every day | ORAL | Status: DC
  Administered 2017-07-09: 20 mg via ORAL
  Filled 2017-07-09: qty 1

## 2017-07-09 MED ORDER — RISPERIDONE 0.5 MG PO TABS
0.5000 mg | ORAL_TABLET | Freq: Two times a day (BID) | ORAL | Status: DC
  Administered 2017-07-10 – 2017-07-11 (×3): 0.5 mg via ORAL
  Filled 2017-07-09 (×6): qty 1

## 2017-07-09 MED ORDER — MAGNESIUM HYDROXIDE 400 MG/5ML PO SUSP
30.0000 mL | Freq: Every day | ORAL | Status: DC | PRN
  Administered 2017-07-10: 30 mL via ORAL
  Filled 2017-07-09: qty 30

## 2017-07-09 MED ORDER — PANTOPRAZOLE SODIUM 40 MG PO TBEC
40.0000 mg | DELAYED_RELEASE_TABLET | Freq: Every day | ORAL | Status: DC
  Administered 2017-07-09: 40 mg via ORAL
  Filled 2017-07-09: qty 1

## 2017-07-09 MED ORDER — TRAZODONE HCL 50 MG PO TABS
50.0000 mg | ORAL_TABLET | Freq: Every evening | ORAL | Status: DC | PRN
  Administered 2017-07-09: 50 mg via ORAL
  Filled 2017-07-09: qty 1

## 2017-07-09 MED ORDER — CLONIDINE HCL 0.1 MG PO TABS
0.2000 mg | ORAL_TABLET | Freq: Every day | ORAL | Status: DC
  Administered 2017-07-09: 0.2 mg via ORAL
  Filled 2017-07-09: qty 2

## 2017-07-09 MED ORDER — RISPERIDONE 0.5 MG PO TABS
0.5000 mg | ORAL_TABLET | Freq: Two times a day (BID) | ORAL | Status: DC
  Administered 2017-07-09 (×2): 0.5 mg via ORAL
  Filled 2017-07-09 (×2): qty 1

## 2017-07-09 MED ORDER — FLUOXETINE HCL 20 MG PO CAPS
20.0000 mg | ORAL_CAPSULE | Freq: Every day | ORAL | Status: DC
  Administered 2017-07-10 – 2017-07-11 (×2): 20 mg via ORAL
  Filled 2017-07-09 (×3): qty 1

## 2017-07-09 MED ORDER — GABAPENTIN 300 MG PO CAPS
300.0000 mg | ORAL_CAPSULE | Freq: Two times a day (BID) | ORAL | Status: DC
  Administered 2017-07-10 – 2017-07-11 (×3): 300 mg via ORAL
  Filled 2017-07-09 (×6): qty 1

## 2017-07-09 MED ORDER — GABAPENTIN 300 MG PO CAPS
300.0000 mg | ORAL_CAPSULE | Freq: Two times a day (BID) | ORAL | Status: DC
  Administered 2017-07-09 (×3): 300 mg via ORAL
  Filled 2017-07-09 (×3): qty 1

## 2017-07-09 NOTE — ED Notes (Signed)
Discussed pt's belongings with her and she signed a belongings inventory which is in her chart.
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
## 2017-07-09 NOTE — ED Notes (Signed)
Pt states that she is not suicidal today, but she is Bipolar and having racing thoughts with voices. She is calm and cooperative.

## 2017-07-09 NOTE — ED Notes (Signed)
Pt A&O x 3, no distress noted, calm & cooperative.  Watching TV at present.  Pending [facility] report and [facility] transfer.

## 2017-07-09 NOTE — BH Assessment (Signed)
[facility] Assessment Progress Note  07/09/17: Per AC, patient has been accepted to 407-2 at 2100 hours.

## 2017-07-09 NOTE — ED Notes (Signed)
BELONGINGS :; BLUE TENNIS SHOES AND PAIR OF SLIPPERS. FOOD ITEMS, TOILETRIES, WHITE PHONE CHARGER, BLACK PURSE, $13.00 IN DOLLAR BILLS, id, SIX SILVER-COLOR RINGS, RED PHONE, COSEMETICS, [place], NO MONEY IN [place]. SET OF GLASSES IN CASE IN PURSE AND MULTI-COLOR GLASSES IN PURSE. MEDICATIONS TO INCLUDE OMEPRAZOLE, PREVASTATIN, SETRALINE, CLONIDINE. EVERYTHING IS IN LOCKER 32 IN TCU.

## 2017-07-09 NOTE — ED Notes (Signed)
Report called to [employer], [facility], rm [phone].  Pending [facility] transport.

## 2017-07-09 NOTE — ED Notes (Signed)
Bed: WA32 Expected date:  Expected time:  Means of arrival:  Comments: 

## 2017-07-09 NOTE — ED Notes (Signed)
Belongings moved from locker #32 to #42.

## 2017-07-09 NOTE — ED Notes (Signed)
[facility] transportation called for [facility] transport.

## 2017-07-10 DIAGNOSIS — F333 Major depressive disorder, recurrent, severe with psychotic symptoms: Secondary | ICD-10-CM

## 2017-07-10 DIAGNOSIS — F1210 Cannabis abuse, uncomplicated: Secondary | ICD-10-CM

## 2017-07-10 DIAGNOSIS — Z560 Unemployment, unspecified: Secondary | ICD-10-CM

## 2017-07-10 DIAGNOSIS — F4310 Post-traumatic stress disorder, unspecified: Secondary | ICD-10-CM

## 2017-07-10 DIAGNOSIS — F410 Panic disorder [episodic paroxysmal anxiety] without agoraphobia: Secondary | ICD-10-CM

## 2017-07-10 DIAGNOSIS — Z818 Family history of other mental and behavioral disorders: Secondary | ICD-10-CM

## 2017-07-10 DIAGNOSIS — G4700 Insomnia, unspecified: Secondary | ICD-10-CM

## 2017-07-10 DIAGNOSIS — Z91410 Personal history of adult physical and sexual abuse: Secondary | ICD-10-CM

## 2017-07-10 DIAGNOSIS — F1099 Alcohol use, unspecified with unspecified alcohol-induced disorder: Secondary | ICD-10-CM

## 2017-07-10 DIAGNOSIS — F515 Nightmare disorder: Secondary | ICD-10-CM

## 2017-07-10 DIAGNOSIS — R450 Nervousness: Secondary | ICD-10-CM

## 2017-07-10 DIAGNOSIS — F1490 Cocaine use, unspecified, uncomplicated: Secondary | ICD-10-CM

## 2017-07-10 DIAGNOSIS — M549 Dorsalgia, unspecified: Secondary | ICD-10-CM

## 2017-07-10 MED ORDER — TRAZODONE HCL 50 MG PO TABS
50.0000 mg | ORAL_TABLET | Freq: Every evening | ORAL | Status: DC | PRN
  Administered 2017-07-10: 50 mg via ORAL
  Filled 2017-07-10: qty 1

## 2017-07-10 MED ORDER — TRAZODONE HCL 50 MG PO TABS
50.0000 mg | ORAL_TABLET | Freq: Once | ORAL | Status: AC
  Administered 2017-07-10: 50 mg via ORAL
  Filled 2017-07-10 (×2): qty 1

## 2017-07-10 MED ORDER — TRAZODONE HCL 100 MG PO TABS
100.0000 mg | ORAL_TABLET | Freq: Every day | ORAL | Status: DC
  Filled 2017-07-10: qty 1

## 2017-07-10 MED ORDER — MAGNESIUM CITRATE PO SOLN
1.0000 | Freq: Once | ORAL | Status: AC
  Administered 2017-07-10: 1 via ORAL
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
## 2017-07-10 NOTE — Progress Notes (Signed)
Writer spoke with patient and she reported having had a good day. She reports that she is starting to feel normal again and not feeling like she is in a bubble. She reports attending groups today and like them also. She was informed of medications available. She requested that she only take 50 mg of trazadone because 100 mg was too much. The order was already in place for her. Her goal for today was to stay out of her room. She has been up in the dayroom interacting with peers. Safety maintained on unit with 15 min checks.
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
## 2017-07-10 NOTE — Progress Notes (Signed)
D: Patient was a new admit to the unit yesterday.  She is pleasant; her mood is stable.  Patient states, "I'm feeling much better today."  She denies any depressive symptoms or anxiety.  Patient is focused on going home because she is "starting school tomorrow."  She also is worried because she is having "work done on my house."  She denies any thoughts of self harm.  A: Continue to monitor medication management and MD orders.  Safety checks completed every 15 minutes per protocol.  Offer support and encouragement as needed.  R: Patient is receptive to staff; her behavior is appropriate.
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
## 2017-07-10 NOTE — Tx Team (Signed)
Initial Treatment Plan 07/10/2017 12:23 AM [name] FBP:[id number]    PATIENT STRESSORS: Loss of pt reports baby's father died during this time and 3 friends died in last 2 mths. Medication change or noncompliance   PATIENT STRENGTHS: Ability for insight Capable of independent living Motivation for treatment/growth Supportive family/friends   PATIENT IDENTIFIED PROBLEMS: Depression  SI        " I would like to have a more spiritual connection."           DISCHARGE CRITERIA:  Improved stabilization in mood, thinking, and/or behavior Need for constant or close observation no longer present  PRELIMINARY DISCHARGE PLAN: Return to previous living arrangement  PATIENT/FAMILY INVOLVEMENT: This treatment plan has been presented to and reviewed with the patient, Tonya Mckenzie, and/or family member.  The patient and family have been given the opportunity to ask questions and make suggestions.  Aurora Mask, RN 07/10/2017, 12:23 AM

## 2017-07-10 NOTE — BHH Suicide Risk Assessment (Signed)
[facility] INPATIENT:  Family/Significant Other Suicide Prevention Education  Suicide Prevention Education:  Patient Refusal for Family/Significant Other Suicide Prevention Education: The patient SHANELLE CLONTZ has refused to provide written consent for family/significant other to be provided Family/Significant Other Suicide Prevention Education during admission and/or prior to discharge.  Physician notified.  Suicide Prevention Education was reviewed thoroughly with patient, including risk factors, warning signs, and what to do.  Mobile Crisis services were described and that telephone number pointed out, with encouragement to patient to put this number in personal cell phone.  Brochure was provided to patient to share with loved ones.  Patient acknowledged the ways in which they are at risk, and how working through each of their issues can gradually start to reduce their risk factors.  Patient was encouraged to think of the information in the context of people in their own lives.  Patient denied having access to weapons.  Patient endorsed a desire to live.   Berlin Hun Grossman-Orr 07/10/2017, 3:06 PM
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
## 2017-07-11 DIAGNOSIS — F4312 Post-traumatic stress disorder, chronic: Secondary | ICD-10-CM

## 2017-07-11 DIAGNOSIS — F1290 Cannabis use, unspecified, uncomplicated: Secondary | ICD-10-CM

## 2017-07-11 DIAGNOSIS — Z720 Tobacco use: Secondary | ICD-10-CM

## 2017-07-11 LAB — LIPID PANEL
CHOLESTEROL: 195 mg/dL (ref 0–200)
HDL: 49 mg/dL (ref >40)
LDL Cholesterol: 119 mg/dL — ABNORMAL HIGH (ref 0–99)
Total CHOL/HDL Ratio: 4 RATIO
Triglycerides: 136 mg/dL (ref <150)
VLDL: 27 mg/dL (ref 0–40)

## 2017-07-11 LAB — HEMOGLOBIN A1C
Hgb A1c MFr Bld: 6.2 % — ABNORMAL HIGH (ref 4.8–5.6)
MEAN PLASMA GLUCOSE: 131.24 mg/dL

## 2017-07-11 MED ORDER — RISPERIDONE 0.5 MG PO TABS: 0.5000 mg | ORAL_TABLET | Freq: Two times a day (BID) | ORAL | 0 refills | Status: DC

## 2017-07-11 MED ORDER — CLONIDINE HCL 0.2 MG PO TABS: 0.2000 mg | ORAL_TABLET | Freq: Every day | ORAL | 0 refills | Status: DC

## 2017-07-11 MED ORDER — TRAZODONE HCL 50 MG PO TABS: 50.0000 mg | ORAL_TABLET | Freq: Every evening | ORAL | 0 refills | Status: DC | PRN

## 2017-07-11 MED ORDER — GABAPENTIN 300 MG PO CAPS: 300.0000 mg | ORAL_CAPSULE | Freq: Two times a day (BID) | ORAL | 0 refills | Status: DC

## 2017-07-11 MED ORDER — FLUOXETINE HCL 20 MG PO CAPS: 20.0000 mg | ORAL_CAPSULE | Freq: Every day | ORAL | 0 refills | Status: DC

## 2017-07-11 NOTE — BHH Suicide Risk Assessment (Signed)
[facility] Discharge Suicide Risk Assessment   Principal Problem: <principal problem not specified> Discharge Diagnoses:  Patient Active Problem List   Diagnosis Date Noted  . Major depressive disorder, recurrent, severe with psychotic features ([place]) [F33.3] 07/09/2017  . Cocaine abuse with cocaine-induced psychotic disorder ([place]) [F14.159] 07/09/2017  . MDD (major depressive disorder), recurrent severe, without psychosis ([place]) [F33.2] 07/09/2017    Total Time spent with patient: 30 minutes  Musculoskeletal: Strength & Muscle Tone: within normal limits Gait & Station: normal Patient leans: N/A  Psychiatric Specialty Exam: Review of Systems  All other systems reviewed and are negative.   Blood pressure (!) 129/97, pulse 81, temperature 98.4 F (36.9 C), temperature source Oral, resp. rate 16, height 5\' 6"  (1.676 m), weight 77.6 kg (171 lb), SpO2 97 %.Body mass index is 27.6 kg/m.  General Appearance: Casual  Eye Contact::  Fair  Speech:  Clear and [id number]  Volume:  Normal  Mood:  Euthymic  Affect:  Congruent  Thought Process:  Coherent  Orientation:  Full (Time, Place, and Person)  Thought Content:  Logical  Suicidal Thoughts:  No  Homicidal Thoughts:  No  Memory:  Immediate;   Fair  Judgement:  Intact  Insight:  Fair  Psychomotor Activity:  Normal  Concentration:  Fair  Recall:  [employer] of Knowledge:Fair  Language: Fair  Akathisia:  Negative  Handed:  Right  AIMS (if indicated):     Assets:  Desire for Improvement Housing  Sleep:  Number of Hours: 5  Cognition: WNL  ADL's:  Intact   Mental Status Per Nursing Assessment::   On Admission:  NA  Demographic Factors:  Low socioeconomic status  Loss Factors: NA  Historical Factors: Impulsivity  Risk Reduction Factors:   Positive social support and Positive therapeutic relationship  Continued Clinical Symptoms:  Alcohol/Substance Abuse/Dependencies  Cognitive Features That Contribute To Risk:  None     Suicide Risk:  Minimal: No identifiable suicidal ideation.  Patients presenting with no risk factors but with morbid ruminations; may be classified as minimal risk based on the severity of the depressive symptoms  Follow-up Information    [facility] Follow up.   Why:  Medication management with [place] Guardian information: [address] [place] [id number] [phone]           Plan Of Care/Follow-up recommendations:  Activity:  ad lib  Sharma Covert, MD 07/11/2017, 9:34 AM

## 2017-07-11 NOTE — Tx Team (Signed)
Interdisciplinary Treatment and Diagnostic Plan Update  07/11/2017 Time of Session: 10:20am Tonya Mckenzie MRN: [id number]  Principal Diagnosis: <principal problem not specified>  Secondary Diagnoses: Active Problems:   MDD (major depressive disorder), recurrent severe, without psychosis ([place])   Current Medications:  Current Facility-Administered Medications  Medication Dose Route Frequency Provider Last Rate Last Dose  . acetaminophen (TYLENOL) tablet 650 mg  650 mg Oral Q6H PRN Ethelene Hal, NP   650 mg at 07/10/17 7353  . alum & mag hydroxide-simeth (MAALOX/MYLANTA) 200-200-20 MG/5ML suspension 30 mL  30 mL Oral Q4H PRN Ethelene Hal, NP      . cloNIDine (CATAPRES) tablet 0.2 mg  0.2 mg Oral Daily Ethelene Hal, NP   0.2 mg at 07/11/17 0755  . FLUoxetine (PROZAC) capsule 20 mg  20 mg Oral Daily Ethelene Hal, NP   20 mg at 07/11/17 0755  . gabapentin (NEURONTIN) capsule 300 mg  300 mg Oral BID Ethelene Hal, NP   300 mg at 07/11/17 0756  . magnesium hydroxide (MILK OF MAGNESIA) suspension 30 mL  30 mL Oral Daily PRN Ethelene Hal, NP   30 mL at 07/10/17 1603  . pantoprazole (PROTONIX) EC tablet 40 mg  40 mg Oral Daily Ethelene Hal, NP   40 mg at 07/11/17 0756  . pravastatin (PRAVACHOL) tablet 40 mg  40 mg Oral Daily Ethelene Hal, NP   40 mg at 07/11/17 0755  . risperiDONE (RISPERDAL) tablet 0.5 mg  0.5 mg Oral BID Ethelene Hal, NP   0.5 mg at 07/11/17 0756  . traZODone (DESYREL) tablet 50 mg  50 mg Oral QHS PRN Cobos, Myer Peer, MD   50 mg at 07/10/17 2143   PTA Medications: Facility-Administered Medications Prior to Admission  Medication Dose Route Frequency Provider Last Rate Last Dose  . 0.9 %  sodium chloride infusion  500 mL Intravenous Continuous Danis, Estill Cotta III, MD      . betamethasone acetate-betamethasone sodium phosphate (CELESTONE) injection 3 mg  3 mg Intramuscular Once Edrick Kins, DPM        Medications Prior to Admission  Medication Sig Dispense Refill Last Dose  . cloNIDine (CATAPRES) 0.2 MG tablet TK 1 T PO  BID  2 07/09/2017 at Unknown time  . pravastatin (PRAVACHOL) 40 MG tablet Take 40 mg by mouth daily.   07/09/2017 at Unknown time  . dexlansoprazole (DEXILANT) 60 MG capsule Take 60 mg by mouth daily.   07/08/2017 at Unknown time  . fluconazole (DIFLUCAN) 150 MG tablet 1 tab po x 1. May repeat in 72 hours if no improvement (Patient not taking: Reported on 07/08/2017) 2 tablet 0 Completed Course at Unknown time  . gabapentin (NEURONTIN) 300 MG capsule TAKE 1 CAPSULE BY MOUTH TWICE A DAY 60 capsule 1 unknown  . ibuprofen (ADVIL,MOTRIN) 800 MG tablet Take 1 tablet (800 mg total) by mouth every 8 (eight) hours as needed. (Patient not taking: Reported on 07/08/2017) 30 tablet 0 Completed Course at Unknown time  . meloxicam (MOBIC) 15 MG tablet TAKE 1 TABLET BY MOUTH DAILY (Patient not taking: Reported on 07/08/2017) 90 tablet 0 Completed Course at Unknown time  . metroNIDAZOLE (FLAGYL) 500 MG tablet Take 1 tablet (500 mg total) by mouth 2 (two) times daily. X 7 days (Patient not taking: Reported on 07/08/2017) 14 tablet 0 Completed Course at Unknown time  . traZODone (DESYREL) 100 MG tablet Take 50-100 mg by mouth at bedtime as needed  for sleep.    07/07/2017 at Unknown time    Patient Stressors: Loss of pt reports baby's father died during this time and 3 friends died in last 2 mths. Medication change or noncompliance  Patient Strengths: Ability for insight Capable of independent living Motivation for treatment/growth Supportive family/friends  Treatment Modalities: Medication Management, Group therapy, Case management,  1 to 1 session with clinician, Psychoeducation, Recreational therapy.   Physician Treatment Plan for Primary Diagnosis: <principal problem not specified> Long Term Goal(s): Improvement in symptoms so as ready for discharge Improvement in symptoms so as ready for  discharge   Short Term Goals: Ability to identify changes in lifestyle to reduce recurrence of condition will improve Ability to maintain clinical measurements within normal limits will improve Ability to identify changes in lifestyle to reduce recurrence of condition will improve Ability to verbalize feelings will improve Ability to disclose and discuss suicidal ideas Ability to demonstrate self-control will improve Ability to identify and develop effective coping behaviors will improve  Medication Management: Evaluate patient's response, side effects, and tolerance of medication regimen.  Therapeutic Interventions: 1 to 1 sessions, Unit Group sessions and Medication administration.  Evaluation of Outcomes: Adequate for Discharge  Physician Treatment Plan for Secondary Diagnosis: Active Problems:   MDD (major depressive disorder), recurrent severe, without psychosis ([place])  Long Term Goal(s): Improvement in symptoms so as ready for discharge Improvement in symptoms so as ready for discharge   Short Term Goals: Ability to identify changes in lifestyle to reduce recurrence of condition will improve Ability to maintain clinical measurements within normal limits will improve Ability to identify changes in lifestyle to reduce recurrence of condition will improve Ability to verbalize feelings will improve Ability to disclose and discuss suicidal ideas Ability to demonstrate self-control will improve Ability to identify and develop effective coping behaviors will improve     Medication Management: Evaluate patient's response, side effects, and tolerance of medication regimen.  Therapeutic Interventions: 1 to 1 sessions, Unit Group sessions and Medication administration.  Evaluation of Outcomes: Adequate for Discharge   RN Treatment Plan for Primary Diagnosis: <principal problem not specified> Long Term Goal(s): Knowledge of disease and therapeutic regimen to maintain health will  improve  Short Term Goals: Ability to remain free from injury will improve, Ability to verbalize frustration and anger appropriately will improve, Ability to demonstrate self-control, Ability to participate in decision making will improve, Ability to verbalize feelings will improve, Ability to disclose and discuss suicidal ideas, Ability to identify and develop effective coping behaviors will improve and Compliance with prescribed medications will improve  Medication Management: RN will administer medications as ordered by provider, will assess and evaluate patient's response and provide education to patient for prescribed medication. RN will report any adverse and/or side effects to prescribing provider.  Therapeutic Interventions: 1 on 1 counseling sessions, Psychoeducation, Medication administration, Evaluate responses to treatment, Monitor vital signs and CBGs as ordered, Perform/monitor CIWA, COWS, AIMS and Fall Risk screenings as ordered, Perform wound care treatments as ordered.  Evaluation of Outcomes: Adequate for Discharge   LCSW Treatment Plan for Primary Diagnosis: <principal problem not specified> Long Term Goal(s): Safe transition to appropriate next level of care at discharge, Engage patient in therapeutic group addressing interpersonal concerns.  Short Term Goals: Engage patient in aftercare planning with referrals and resources, Increase social support, Increase ability to appropriately verbalize feelings, Increase emotional regulation, Facilitate acceptance of mental health diagnosis and concerns, Facilitate patient progression through stages of change regarding substance use diagnoses and  concerns, Identify triggers associated with mental health/substance abuse issues and Increase skills for wellness and recovery  Therapeutic Interventions: Assess for all discharge needs, 1 to 1 time with Social worker, Explore available resources and support systems, Assess for adequacy in  community support network, Educate family and significant other(s) on suicide prevention, Complete Psychosocial Assessment, Interpersonal group therapy.  Evaluation of Outcomes: Adequate for Discharge   Progress in Treatment: Attending groups: Yes. Participating in groups: Yes. Taking medication as prescribed: Yes. Toleration medication: Yes. Family/Significant other contact made: No, will contact:  patient refused consent Patient understands diagnosis: Yes. Discussing patient identified problems/goals with staff: Yes. Medical problems stabilized or resolved: Yes. Denies suicidal/homicidal ideation: Yes. Issues/concerns per patient self-inventory: No. Other:   New problem(s) identified: None   New Short Term/Long Term Goal(s):medication stabilization, elimination of SI thoughts, development of comprehensive mental wellness plan.   Patient Goals: "to get through my stress issues"  Discharge Plan or Barriers: Patient will discharge home and will continue to follow up with [facility] for medication management and therapy services.   Reason for Continuation of Hospitalization: None   Estimated Length of Stay: Discharged, Monday 07/11/17  Attendees: Patient: Tonya Mckenzie 07/11/2017 8:49 AM  Physician: Dr. Myles Lipps, MD 07/11/2017 8:49 AM  Nursing: Darrol Angel, RN 07/11/2017 8:49 AM  RN Care Manager: Rhunette Croft 07/11/2017 8:49 AM  Social Worker: Radonna Ricker, [place] 07/11/2017 8:49 AM  Recreational Therapist: X 07/11/2017 8:49 AM  Other: X 07/11/2017 8:49 AM  Other: X 07/11/2017 8:49 AM  Other:X 07/11/2017 8:49 AM    Scribe for Treatment Team: Marylee Floras, [place] 07/11/2017 8:49 AM

## 2017-07-11 NOTE — Progress Notes (Signed)
Pt received both written and verbal discharge instructions. Pt verbalized understanding of discharge instructions. Pt agreed to f/u appt and med regimen. Pt received d/c packet and prescriptions. Pt gathered belongings from room and locker. Pt safely discharged to the lobby and transported by security to [facility] parking lot.

## 2017-07-11 NOTE — [facility] Group Notes (Signed)
[place] Group Notes:  (Nursing/MHT/Case Management/Adjunct)  Date:  07/10/17 Time:  8:20p  Type of Therapy:  Wrap up Group  Participation Level:  Active  Participation Quality:  Appropriate and Attentive  Affect:  Appropriate  Cognitive:  Alert and Appropriate  Insight:  Good  Engagement in Group:  Engaged and Supportive  Modes of Intervention:  Discussion and Support  Summary of Progress/Problems:  Leandrea reported that her day was an 8/10 (10 the best).  She was able to accomplish her goal of meeting some people.  "I am glad that I have been up and about more."  "I really like this group, they have been helpful."  Windell Moment 07/11/2017, 12:58 AM
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
## 2017-07-11 NOTE — Progress Notes (Signed)
  [facility] Adult Case Management Discharge Plan :  Will you be returning to the same living situation after discharge:  Yes,  patient plans to return home At discharge, do you have transportation home?: Yes,  patient reports her car is in [facility] parking lot Do you have the ability to pay for your medications: No.  Release of information consent forms completed and in the chart;  Patient's signature needed at discharge.  Patient to Follow up at: Follow-up Information    [facility]. Go on 07/13/2017.   Why:  Appointment is 07/13/17 at 11:40am. Please be sure to bring all of your medications.  Contact information: [address] [place] [address] [phone]           Next level of care provider has access to [facility] and Suicide Prevention discussed: Yes,  with the patient  Have you used any form of tobacco in the last 30 days? (Cigarettes, Smokeless Tobacco, Cigars, and/or Pipes): No  Has patient been referred to the Quitline?: N/A patient is not a smoker  Patient has been referred for addiction treatment: N/A  Marylee Floras, [place] 07/11/2017, 11:22 AM

## 2017-07-19 ENCOUNTER — Encounter

## 2017-07-19 ENCOUNTER — Encounter: Payer: Self-pay | Admitting: Podiatry

## 2017-07-19 ENCOUNTER — Ambulatory Visit (INDEPENDENT_AMBULATORY_CARE_PROVIDER_SITE_OTHER): Payer: Medicaid Other | Admitting: Podiatry

## 2017-07-19 DIAGNOSIS — M7752 Other enthesopathy of left foot: Secondary | ICD-10-CM | POA: Diagnosis not present

## 2017-07-19 DIAGNOSIS — M778 Other enthesopathies, not elsewhere classified: Secondary | ICD-10-CM

## 2017-07-19 DIAGNOSIS — M779 Enthesopathy, unspecified: Principal | ICD-10-CM

## 2017-07-20 NOTE — Progress Notes (Signed)
She presents today for a chief complaint of a pain this beneath the second metatarsal phalangeal joint area of her left foot she states that it feels like there is something in the ball of her foot the pain is usually a 10 out of 10 and is been for about 2 months now she states that really only hurts when she is on it does not really hurt if she is not on it she still feels a tingling and a little numbness in the area.  Objective: Vital signs are stable she is alert and oriented x3.  Pulses are palpable.  Neurologic sensorium is intact.  She has good full range of motion of all of the joints at the metatarsal phalangeal joint she does have some soft tissue swelling around the second metatarsal phalangeal joint with pain on palpation of the joint and pain on end range of motion of the joint itself.  Assessment: Capsulitis of the second metatarsophalangeal joint  Plan: After sterile Betadine skin prep I injected 20 mg of Kenalog 5 mg Marcaine to the second metatarsophalangeal joint of the left she tolerated this well without complications.  We discussed in great detail today appropriate shoe gear stretching exercise ice therapy sugar modifications and the possible need for surgery in the future if this does not resolve.

## 2017-07-21 ENCOUNTER — Ambulatory Visit: Payer: Medicaid Other | Admitting: Podiatry

## 2017-09-15 DIAGNOSIS — M79676 Pain in unspecified toe(s): Secondary | ICD-10-CM

## 2017-09-29 ENCOUNTER — Ambulatory Visit: Payer: Medicaid Other | Admitting: Podiatry

## 2017-10-03 DIAGNOSIS — M79676 Pain in unspecified toe(s): Secondary | ICD-10-CM

## 2017-10-04 ENCOUNTER — Ambulatory Visit: Payer: Medicaid Other | Admitting: Podiatry

## 2017-10-14 ENCOUNTER — Ambulatory Visit: Payer: Self-pay | Admitting: Gastroenterology

## 2017-10-18 ENCOUNTER — Encounter

## 2017-10-18 ENCOUNTER — Encounter: Payer: Self-pay | Admitting: Podiatry

## 2017-10-18 ENCOUNTER — Ambulatory Visit: Payer: Medicaid Other | Admitting: Podiatry

## 2017-10-18 DIAGNOSIS — M7752 Other enthesopathy of left foot: Secondary | ICD-10-CM

## 2017-10-18 DIAGNOSIS — M779 Enthesopathy, unspecified: Principal | ICD-10-CM

## 2017-10-18 DIAGNOSIS — M778 Other enthesopathies, not elsewhere classified: Secondary | ICD-10-CM

## 2017-10-18 NOTE — Patient Instructions (Signed)
Pre-Operative Instructions  Congratulations, you have decided to take an important step towards improving your quality of life.  You can be assured that the doctors and staff at [facility] will be with you every step of the way.  Here are some important things you should know:  1. Plan to be at the surgery center/hospital at least 1 (one) hour prior to your scheduled time, unless otherwise directed by the surgical center/hospital staff.  You must have a responsible adult accompany you, remain during the surgery and drive you home.  Make sure you have directions to the surgical center/hospital to ensure you arrive on time. 2. If you are having surgery at [facility] or [facility] hospitals, you will need a copy of your medical history and physical form from your family physician within one month prior to the date of surgery. We will give you a form for your primary physician to complete.  3. We make every effort to accommodate the date you request for surgery.  However, there are times where surgery dates or times have to be moved.  We will contact you as soon as possible if a change in schedule is required.   4. No aspirin/ibuprofen for one week before surgery.  If you are on aspirin, any non-steroidal anti-inflammatory medications (Mobic, Aleve, Ibuprofen) should not be taken seven (7) days prior to your surgery.  You make take Tylenol for pain prior to surgery.  5. Medications - If you are taking daily heart and blood pressure medications, seizure, reflux, allergy, asthma, anxiety, pain or diabetes medications, make sure you notify the surgery center/hospital before the day of surgery so they can tell you which medications you should take or avoid the day of surgery. 6. No food or drink after midnight the night before surgery unless directed otherwise by surgical center/hospital staff. 7. No alcoholic beverages 24-hours prior to surgery.  No smoking 24-hours prior or 24-hours after  surgery. 8. Wear loose pants or shorts. They should be loose enough to fit over bandages, boots, and casts. 9. Don't wear slip-on shoes. Sneakers are preferred. 10. Bring your boot with you to the surgery center/hospital.  Also bring crutches or a walker if your physician has prescribed it for you.  If you do not have this equipment, it will be provided for you after surgery. 11. If you have not been contacted by the surgery center/hospital by the day before your surgery, call to confirm the date and time of your surgery. 12. Leave-time from work may vary depending on the type of surgery you have.  Appropriate arrangements should be made prior to surgery with your employer. 13. Prescriptions will be provided immediately following surgery by your doctor.  Fill these as soon as possible after surgery and take the medication as directed. Pain medications will not be refilled on weekends and must be approved by the doctor. 14. Remove nail polish on the operative foot and avoid getting pedicures prior to surgery. 15. Wash the night before surgery.  The night before surgery wash the foot and leg well with water and the antibacterial soap provided. Be sure to pay special attention to beneath the toenails and in between the toes.  Wash for at least three (3) minutes. Rinse thoroughly with water and dry well with a towel.  Perform this wash unless told not to do so by your physician.  Enclosed: 1 Ice pack (please put in freezer the night before surgery)   1 Hibiclens skin cleaner     Pre-op instructions  If you have any questions regarding the instructions, please do not hesitate to call our office.  [place]: [address], [place], [place] [address] -- [phone]  [place]: [address] [place], [place] [address] -- [phone]  [place]: [address]  [place], [place] [address] -- [phone]  [place]: [address], [address], [place], [place] [address] -- [phone]  Website:  [email or web address] 

## 2017-10-18 NOTE — Progress Notes (Signed)
She presents today for a chief complaint of painful second metatarsophalangeal joint of the left foot.  She states it just hurts so bad it can hardly stand.  Objective: Vital signs are stable alert and oriented x3.  Pulses are palpable.  Neurologic sensorium is intact.  Degenerative flexors are intact.  Muscle strength normal symmetrical bilateral left.  Left first metatarsophalangeal joints had surgery before and has done very well.  She has pain on palpation and end range of motion of a medially dislocated second hammertoe left foot.  Exquisitely tender palpation of the second metatarsophalangeal joint left.  Review of radiographs taken today demonstrate a elongated second metatarsal medial deviated second toe with lateral deviation of the PIPJ of the toe.  Assessment: Capsulitis and hammertoe deformity second left.  Plan: Discussed etiology pathology and surgical therapies at this point time after sterile Betadine skin prep injected 20 mg Kenalog 5 mg Marcaine the lateral aspect of the second metatarsal phalangeal joint.  I also consented her today for a second metatarsal osteotomy with double screw fixation and hammertoe repair with screw fixation second digit left foot.  I answered all the questions regarding these procedures today to the best my billing layman's terms she understood it was amenable to it also discussed possible postop complications which may include but are not limited to postop pain bleeding swelling infection recurrence need further surgery overcorrection under correction loss of digit loss of limb loss of life.  She understands this is amenable to it she understands she is to be off of her foot for a while.  I will follow-up with her in the near future for surgical intervention.

## 2017-10-19 ENCOUNTER — Encounter

## 2017-10-19 ENCOUNTER — Encounter: Payer: Self-pay | Admitting: Nurse Practitioner

## 2017-10-19 ENCOUNTER — Ambulatory Visit: Payer: Medicaid Other | Admitting: Nurse Practitioner

## 2017-10-19 VITALS — BP 118/70 | HR 76 | Ht 66.0 in | Wt 173.0 lb

## 2017-10-19 DIAGNOSIS — R1012 Left upper quadrant pain: Secondary | ICD-10-CM | POA: Diagnosis not present
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
## 2017-10-19 NOTE — Patient Instructions (Signed)
If you are age 55 or older, your body mass index should be between 23-30. Your Body mass index is 27.92 kg/m. If this is out of the aforementioned range listed, please consider follow up with your Primary Care Provider.  If you are age 17 or younger, your body mass index should be between 19-25. Your Body mass index is 27.92 kg/m. If this is out of the aformentioned range listed, please consider follow up with your Primary Care Provider.   Use Miralax 1/2 to 1 capful daily.  Call in 3 weeks with an update.  Thank you for choosing me and [facility].   Tye Savoy, NP

## 2017-10-20 ENCOUNTER — Encounter: Payer: Self-pay | Admitting: Nurse Practitioner

## 2017-10-20 ENCOUNTER — Other Ambulatory Visit: Payer: Self-pay | Admitting: Family Medicine

## 2017-10-20 ENCOUNTER — Other Ambulatory Visit (HOSPITAL_COMMUNITY)
Admission: RE | Admit: 2017-10-20 | Discharge: 2017-10-20 | Disposition: A | Discharge status: Home / Self Care | Payer: Medicaid Other | Source: Ambulatory Visit | Attending: Family Medicine | Admitting: Family Medicine

## 2017-10-20 DIAGNOSIS — N898 Other specified noninflammatory disorders of vagina: Secondary | ICD-10-CM | POA: Insufficient documentation

## 2017-10-21 NOTE — Progress Notes (Signed)
Thank you for sending this case to me. I have reviewed the entire note, and the outlined plan seems appropriate.  This pain does not appear to be of GI origin, and she should return to primary care for further evaluation and management.  Wilfrid Lund, MD

## 2017-10-24 LAB — URINE CYTOLOGY ANCILLARY ONLY
Bacterial vaginitis: NEGATIVE
Candida vaginitis: NEGATIVE
Chlamydia: NEGATIVE
Neisseria Gonorrhea: NEGATIVE
TRICH (WINDOWPATH): NEGATIVE

## 2017-10-26 ENCOUNTER — Telehealth: Payer: Self-pay | Admitting: *Deleted

## 2017-10-26 NOTE — Telephone Encounter (Signed)
"  I'm scheduled for surgery on either September 11 or October 11.  I cannot remember.  Can you tell me when it is scheduled and give me the time?"  You are scheduled for October 11.  Someone from the surgical center will call you a day or two prior to your surgery date.  They will give you your arrival time.

## 2017-11-21 ENCOUNTER — Telehealth: Payer: Self-pay | Admitting: *Deleted

## 2017-11-21 NOTE — Telephone Encounter (Signed)
I will reschedule patients postop appointments to reflect her new date of surgery.

## 2017-11-21 NOTE — Telephone Encounter (Signed)
I need to reschedule my surgery.  I have exams and I don't want to miss those.  Can I reschedule it?  If not, I guess I'll keep it."  Dr. Milinda Pointer can do it the following week on 12/16/2017.  "That will be great.  Will you let the surgery center know or do I need to call them?"  I will let them know.  "Thank you."  I sent Caren Griffins, scheduler for the surgery center, a message via [employer] to reschedule her surgery from 12/09/2017 to 12/16/2017.

## 2017-11-23 ENCOUNTER — Telehealth: Payer: Self-pay | Admitting: *Deleted

## 2017-11-23 NOTE — Telephone Encounter (Signed)
I made a mistake.  Dr. Milinda Pointer does not have anything available on October 18 to do your surgery.  His next available is November 1.  "That's fine, that's even better!."  Well good, I will get it rescheduled.  Thank you you so much.

## 2017-11-28 ENCOUNTER — Encounter (INDEPENDENT_AMBULATORY_CARE_PROVIDER_SITE_OTHER): Payer: Self-pay | Admitting: Orthopaedic Surgery

## 2017-11-28 ENCOUNTER — Other Ambulatory Visit (INDEPENDENT_AMBULATORY_CARE_PROVIDER_SITE_OTHER): Payer: Self-pay | Admitting: Radiology

## 2017-11-28 ENCOUNTER — Ambulatory Visit (INDEPENDENT_AMBULATORY_CARE_PROVIDER_SITE_OTHER): Payer: Medicaid Other | Admitting: Orthopaedic Surgery

## 2017-11-28 ENCOUNTER — Ambulatory Visit (INDEPENDENT_AMBULATORY_CARE_PROVIDER_SITE_OTHER): Payer: Medicaid Other

## 2017-11-28 VITALS — BP 132/88 | HR 64 | Ht 66.0 in | Wt 170.0 lb

## 2017-11-28 DIAGNOSIS — M545 Low back pain, unspecified: Secondary | ICD-10-CM

## 2017-11-28 DIAGNOSIS — G8929 Other chronic pain: Secondary | ICD-10-CM | POA: Diagnosis not present

## 2017-11-28 NOTE — Progress Notes (Signed)
Office Visit Note   Patient: Tonya Mckenzie           Date of Birth: [birth year]           MRN: [id number] Visit Date: 11/28/2017              Requested by: Tonya Mckenzie, [address] [place], [place] [address] PCP: Tonya Lei, MD   Assessment & Plan: Visit Diagnoses:  1. Chronic midline low back pain without sciatica     Plan: Chronic low back pain over 20 years with possible sciatica of the left lower extremity.  Long discussion regarding potential diagnoses.  Films actually look quite good.  We will try a course of physical therapy and try NSAIDs to dinner.  Office 6 weeks.  Consider MRI scan at some point in the future  Follow-Up Instructions: Return in about 6 weeks (around 01/09/2018).   Orders:  Orders Placed This Encounter  Procedures  . XR Lumbar Spine 2-3 Views   No orders of the defined types were placed in this encounter.     Procedures: No procedures performed   Clinical Data: No additional findings.   Subjective: Chief Complaint  Patient presents with  . New Patient (Initial Visit)    LOW BACK PAIN THATS HAS NUMBNESS THAT RADIATES DOWN TO FEET. IN THE 90S FELL DOWN STAIRS DID NOT F/U WITH ANYBODY. PT FELL DOWN STEPS AGAIN 5 YRS AGO, SYPMTOMS GETTING WORSE  Tonya Mckenzie is 57 years old and visits the office with a long history of low back pain.  She first noted onset pain over 20 years ago after a fall.  She is been having predominantly back pain with occasional discomfort into her left lower extremity as far distally as her foot.  She is had one left foot surgery and is contemplating a second with her second toe.  She has a lot of stiffness and soreness when she first gets out of bed in the morning and oftentimes has a difficult time trying to find a comfortable position at night.  Is not having any pain on the right side.  No bowel or bladder changes.  No back surgery  HPI  Review of Systems  Constitutional: Negative for fatigue and fever.  HENT:  Negative for ear pain.   Eyes: Negative for pain.  Respiratory: Negative for cough and shortness of breath.   Cardiovascular: Negative for leg swelling.  Gastrointestinal: Negative for constipation and diarrhea.  Genitourinary: Negative for difficulty urinating.  Musculoskeletal: Positive for back pain and neck pain.  Skin: Negative for rash.  Allergic/Immunologic: Negative for food allergies.  Neurological: Positive for weakness. Negative for numbness.  Hematological: Does not bruise/bleed easily.  Psychiatric/Behavioral: Positive for sleep disturbance.     Objective: Vital Signs: BP 132/88 (BP Location: Right Arm, Patient Position: Sitting, Cuff Size: Normal)   Pulse 64   Ht 5\' 6"  (1.676 m)   Wt 170 lb (77.1 kg)   BMI 27.44 kg/m   Physical Exam  Constitutional: She is oriented to person, place, and time. She appears well-developed and well-nourished.  HENT:  Mouth/Throat: Oropharynx is clear and moist.  Eyes: Pupils are equal, round, and reactive to light. EOM are normal.  Pulmonary/Chest: Effort normal.  Neurological: She is alert and oriented to person, place, and time.  Skin: Skin is warm and dry.  Psychiatric: She has a normal mood and affect. Her behavior is normal.    Ortho Exam awake alert and oriented x3.  Comfortable sitting.  Straight leg raise negative bilaterally.  Painless range of motion both hips.  Reflexes were symmetrical.  Thought the left ankle reflex was slightly less than the right.  Pain around the right great and second toes from prior surgery on this first toe I thought she had good motor strength in both lower extremities.  Some percussible tenderness in the lower lumbar spine particularly more to the left.  Possible small fibroadipose nodule to the left of the midline.  Specialty Comments:  No specialty comments available.  Imaging: Xr Lumbar Spine 2-3 Views  Result Date: 11/28/2017 Films of the lumbar spine were obtained in 2 projections.  No  curvature of the lumbar spine on the AP view.  Sacroiliac joints appear to be intact.  Slight anterior listhesis of L3 on disc space is well-maintained.  Some facet sclerosis at L3-4 .abundant bowel gas    PMFS History: Patient Active Problem List   Diagnosis Date Noted  . Cocaine abuse with cocaine-induced psychotic disorder ([facility]) 07/09/2017  . MDD (major depressive disorder), recurrent severe, without psychosis ([place]) 07/09/2017   Past Medical History:  Diagnosis Date  . Allergic rhinitis   . Anxiety   . Bacterial vaginosis   . Chronic post-traumatic stress disorder (PTSD)   . Cocaine abuse ([place])   . Depression   . ETOH abuse   . GERD (gastroesophageal reflux disease)   . Hyperlipidemia   . Hypertension    off meds due to weight loss  . Marijuana abuse   . Uterine leiomyoma     Family History  Problem Relation Age of Onset  . Diabetes Mother   . Hypertension Mother   . Heart attack Father   . Colon cancer Neg Hx   . Esophageal cancer Neg Hx   . Rectal cancer Neg Hx   . Stomach cancer Neg Hx   . Liver cancer Neg Hx     Past Surgical History:  Procedure Laterality Date  . ABDOMINAL HYSTERECTOMY     partial  . CHOLECYSTECTOMY    . TOE SURGERY Left   . TONSILLECTOMY    . TYMPANOSTOMY TUBE PLACEMENT     Social History   Occupational History  . Occupation: Ship broker  Tobacco Use  . Smoking status: Former Smoker    Types: Cigarettes  . Smokeless tobacco: Never Used  Substance and Sexual Activity  . Alcohol use: Not Currently    Comment: quit drinking 2016  . Drug use: Yes    Types: Cocaine, Marijuana    Comment: reformed went through rehab  . Sexual activity: Not on file

## 2017-12-15 ENCOUNTER — Other Ambulatory Visit: Payer: Medicaid Other

## 2017-12-15 ENCOUNTER — Telehealth: Payer: Self-pay | Admitting: *Deleted

## 2017-12-15 NOTE — Telephone Encounter (Signed)
"  I think the surgical center sent me a text.  I tried to call them back but I can't get an answer.  When I call, the messages says that the number has been disconnected.  Do you know what they were calling to tell me about my surgery?"  I do not know.  You need to call them.  "Do you have a number?"  The number is [phone].

## 2017-12-29 ENCOUNTER — Other Ambulatory Visit: Payer: Medicaid Other

## 2017-12-29 ENCOUNTER — Other Ambulatory Visit: Payer: Self-pay | Admitting: Podiatry

## 2017-12-29 ENCOUNTER — Telehealth: Payer: Self-pay | Admitting: *Deleted

## 2017-12-29 MED ORDER — IBUPROFEN 600 MG PO TABS: 600.0000 mg | ORAL_TABLET | Freq: Three times a day (TID) | ORAL | 0 refills | Status: DC | PRN

## 2017-12-29 MED ORDER — CEPHALEXIN 500 MG PO CAPS: 500.0000 mg | ORAL_CAPSULE | Freq: Three times a day (TID) | ORAL | 0 refills | Status: DC

## 2017-12-29 NOTE — Telephone Encounter (Signed)
I left her a message to call me back regarding her insurance.  I am seeing a termination date of 12/29/2017.  She returned my call.  She said as far as she knows, she still has Medicaid.  She gave me the phone number to [facility].  I called the number but it does not go into effect until February 2020.  I called [employer] and they informed me that they would not be able to inform me until tomorrow since it is the start of a new month.

## 2017-12-30 ENCOUNTER — Encounter: Payer: Self-pay | Admitting: Podiatry

## 2017-12-30 DIAGNOSIS — M2042 Other hammer toe(s) (acquired), left foot: Secondary | ICD-10-CM

## 2017-12-30 DIAGNOSIS — M21542 Acquired clubfoot, left foot: Secondary | ICD-10-CM

## 2017-12-30 NOTE — Telephone Encounter (Signed)
Patient still has Medicaid.  I e-verified it this morning.

## 2017-12-31 ENCOUNTER — Other Ambulatory Visit: Payer: Self-pay | Admitting: Podiatry

## 2017-12-31 MED ORDER — HYDROCODONE-ACETAMINOPHEN 5-325 MG PO TABS: 1.0000 | ORAL_TABLET | ORAL | 0 refills | Status: DC | PRN

## 2017-12-31 NOTE — Progress Notes (Unsigned)
Postop pain management 

## 2018-01-05 ENCOUNTER — Ambulatory Visit (INDEPENDENT_AMBULATORY_CARE_PROVIDER_SITE_OTHER): Payer: Medicaid Other

## 2018-01-05 ENCOUNTER — Ambulatory Visit (INDEPENDENT_AMBULATORY_CARE_PROVIDER_SITE_OTHER): Payer: Self-pay | Admitting: Podiatry

## 2018-01-05 VITALS — Temp 98.6°F

## 2018-01-05 DIAGNOSIS — M779 Enthesopathy, unspecified: Secondary | ICD-10-CM

## 2018-01-05 DIAGNOSIS — M778 Other enthesopathies, not elsewhere classified: Secondary | ICD-10-CM

## 2018-01-05 DIAGNOSIS — M7752 Other enthesopathy of left foot: Secondary | ICD-10-CM | POA: Diagnosis not present

## 2018-01-05 DIAGNOSIS — Z9889 Other specified postprocedural states: Secondary | ICD-10-CM

## 2018-01-05 NOTE — Progress Notes (Signed)
Presents today 1 week status post second metatarsal osteotomy hammertoe repair with screw second left.  Date of surgery December 30, 2017 denies fever chills nausea vomiting muscle aches and pains states that my toe feels better I was having quite a bit of pain with it after surgery.  Objective: Vital signs are stable alert and oriented x3.  Pulses are palpable.  Neurologic sensorium is intact.  Degenerative flexors are intact.  Muscle strength is normal symmetrical.  Dry sterile dressing intact she has mild edema no erythema cellulitis drainage or odor sutures are intact radiographs taken today demonstrate well-placed capital osteotomy with double screw fixation and single screw to the second toe in good position with good compression at the PIPJ.  Assessment: Well-healing surgical foot left x1 week.  Plan: Redressed today dressed a compressive dressing follow-up with her in 1 week.  May need to leave the stitches in just a bit longer from swelling.

## 2018-01-09 ENCOUNTER — Other Ambulatory Visit: Payer: Self-pay | Admitting: Family Medicine

## 2018-01-09 DIAGNOSIS — Z1231 Encounter for screening mammogram for malignant neoplasm of breast: Secondary | ICD-10-CM

## 2018-01-12 ENCOUNTER — Ambulatory Visit (INDEPENDENT_AMBULATORY_CARE_PROVIDER_SITE_OTHER): Payer: Medicaid Other | Admitting: Podiatry

## 2018-01-12 DIAGNOSIS — Z9889 Other specified postprocedural states: Secondary | ICD-10-CM

## 2018-01-17 ENCOUNTER — Ambulatory Visit (INDEPENDENT_AMBULATORY_CARE_PROVIDER_SITE_OTHER): Payer: Medicaid Other | Admitting: Podiatry

## 2018-01-17 DIAGNOSIS — Z9889 Other specified postprocedural states: Secondary | ICD-10-CM | POA: Diagnosis not present

## 2018-01-18 NOTE — Progress Notes (Signed)
She presents today date of surgery December 30, 2017 status post second metatarsal osteotomy hammertoe repair second with screw states that my foot feels fine I have been getting my foot wet.  She denies fever chills nausea vomiting muscle aches and pains.  Objective: Vital signs are stable alert and oriented x3.  Pulses are palpable.  Neurologic sensorium is intact.  Degenerative flexors are intact.  Muscle strength +5/5 dorsiflexors plantar flexors inverters everters onto the musculature is intact.  Sutures are intact margins are well coapted they were removed today.  There is minimal edema no signs of infection.  Assessment: Well-healing surgical foot.  Plan: Redressed the foot in a compression anklet today after sutures were removed and allow her to move washing this and she is to continue the use of the [place] shoe which was provided today.  I will follow-up with her in 2 weeks

## 2018-01-24 ENCOUNTER — Other Ambulatory Visit: Payer: Medicaid Other

## 2018-02-07 ENCOUNTER — Ambulatory Visit (INDEPENDENT_AMBULATORY_CARE_PROVIDER_SITE_OTHER): Payer: Medicaid Other | Admitting: Podiatry

## 2018-02-07 ENCOUNTER — Ambulatory Visit (INDEPENDENT_AMBULATORY_CARE_PROVIDER_SITE_OTHER): Payer: Medicaid Other

## 2018-02-07 DIAGNOSIS — M779 Enthesopathy, unspecified: Secondary | ICD-10-CM

## 2018-02-07 DIAGNOSIS — M2042 Other hammer toe(s) (acquired), left foot: Secondary | ICD-10-CM | POA: Diagnosis not present

## 2018-02-07 DIAGNOSIS — Z9889 Other specified postprocedural states: Secondary | ICD-10-CM | POA: Diagnosis not present

## 2018-02-07 DIAGNOSIS — M205X2 Other deformities of toe(s) (acquired), left foot: Secondary | ICD-10-CM

## 2018-02-07 DIAGNOSIS — M778 Other enthesopathies, not elsewhere classified: Secondary | ICD-10-CM

## 2018-02-07 DIAGNOSIS — M216X2 Other acquired deformities of left foot: Secondary | ICD-10-CM

## 2018-02-07 DIAGNOSIS — M7752 Other enthesopathy of left foot: Secondary | ICD-10-CM | POA: Diagnosis not present

## 2018-02-07 NOTE — Progress Notes (Signed)
Presents today for follow-up of a painful postsurgical foot date of surgery December 30, 2017 status post hammertoe repair and second metatarsal osteotomy.  States that this really been sore since I were pair of boots the other day.  Objective: Vital signs are stable alert and oriented x3 but only about 6 weeks out at this point she does have considerable swelling between the first and second metatarsals of the left foot.  Radiographs today confirm everything seems good alignment however we still have some swelling.  Assessment: Well-healing osteotomy and hammertoe repair.  Capsulitis first and second metatarsophalangeal joints.  Plan: At this point I went ahead and injected very proximally with 10 mg of Kenalog 5 mg Marcaine point maximal tenderness between the first and second metatarsal tolerated procedure well.  Follow-up with her in about 1 month.

## 2018-02-08 DIAGNOSIS — M79676 Pain in unspecified toe(s): Secondary | ICD-10-CM

## 2018-02-09 ENCOUNTER — Other Ambulatory Visit: Payer: Medicaid Other | Admitting: *Deleted

## 2018-02-16 ENCOUNTER — Ambulatory Visit: Payer: Self-pay | Admitting: Gastroenterology

## 2018-02-20 ENCOUNTER — Ambulatory Visit
Admission: RE | Admit: 2018-02-20 | Discharge: 2018-02-20 | Disposition: A | Discharge status: Home / Self Care | Payer: Medicaid Other | Source: Ambulatory Visit | Attending: Family Medicine | Admitting: Family Medicine

## 2018-02-20 DIAGNOSIS — Z1231 Encounter for screening mammogram for malignant neoplasm of breast: Secondary | ICD-10-CM

## 2018-02-27 ENCOUNTER — Other Ambulatory Visit (HOSPITAL_COMMUNITY)
Admission: RE | Admit: 2018-02-27 | Discharge: 2018-02-27 | Disposition: A | Discharge status: Home / Self Care | Payer: Medicaid Other | Source: Ambulatory Visit | Attending: Family Medicine | Admitting: Family Medicine

## 2018-02-27 ENCOUNTER — Other Ambulatory Visit: Payer: Self-pay | Admitting: Family Medicine

## 2018-02-27 DIAGNOSIS — N898 Other specified noninflammatory disorders of vagina: Secondary | ICD-10-CM | POA: Diagnosis not present

## 2018-03-03 LAB — URINE CYTOLOGY ANCILLARY ONLY
BACTERIAL VAGINITIS: NEGATIVE
Candida vaginitis: NEGATIVE
Chlamydia: NEGATIVE
Neisseria Gonorrhea: NEGATIVE
TRICH (WINDOWPATH): NEGATIVE

## 2018-03-14 ENCOUNTER — Ambulatory Visit: Payer: Medicaid Other | Admitting: Podiatry

## 2018-03-21 ENCOUNTER — Ambulatory Visit: Payer: Medicaid Other

## 2018-03-21 ENCOUNTER — Encounter: Payer: Medicaid Other | Admitting: Podiatry

## 2018-03-28 NOTE — Progress Notes (Signed)
This encounter was created in error - please disregard.

## 2018-03-30 ENCOUNTER — Ambulatory Visit: Payer: Medicaid Other | Admitting: Podiatry

## 2018-04-06 ENCOUNTER — Encounter: Payer: Self-pay | Admitting: Podiatry

## 2018-04-06 ENCOUNTER — Ambulatory Visit: Payer: Medicaid Other | Admitting: Podiatry

## 2018-04-06 ENCOUNTER — Ambulatory Visit (INDEPENDENT_AMBULATORY_CARE_PROVIDER_SITE_OTHER): Payer: Medicaid Other

## 2018-04-06 DIAGNOSIS — M216X2 Other acquired deformities of left foot: Secondary | ICD-10-CM | POA: Diagnosis not present

## 2018-04-06 DIAGNOSIS — M2042 Other hammer toe(s) (acquired), left foot: Secondary | ICD-10-CM

## 2018-04-06 DIAGNOSIS — Z9889 Other specified postprocedural states: Secondary | ICD-10-CM

## 2018-04-06 NOTE — Progress Notes (Signed)
She presents today states that the injection really helped previous date of surgery 12/30/2017 status post second metatarsal and hammertoe repair with screw she states that that seems to be doing fine but I got this bump that comes up under here as she points to the third knuckle.  Objective: Vital signs are stable she is alert and oriented x3.  Pulses are palpable.  Palpation reveals hypertrophic or a plantarflexed third metatarsal head even the fourth metatarsal head and fifth as well as the surgical second are not probable.  Assessment: Plantarflexed third metatarsal with capsulitis.  Plan: Injected the area today with 2 mg of dexamethasone and local anesthetic after sterile Betadine skin prep from the plantar surface.  Tolerated procedure well without complications stated that he felt much better prior to leaving the office.

## 2018-04-21 ENCOUNTER — Ambulatory Visit: Payer: Self-pay | Admitting: Gastroenterology

## 2018-05-02 ENCOUNTER — Ambulatory Visit: Payer: Self-pay | Admitting: Gastroenterology

## 2018-05-16 ENCOUNTER — Telehealth: Payer: Self-pay

## 2018-05-16 NOTE — Telephone Encounter (Signed)

## 2018-05-17 ENCOUNTER — Ambulatory Visit: Payer: Self-pay | Admitting: Gastroenterology

## 2018-05-30 ENCOUNTER — Encounter: Payer: Medicaid Other | Admitting: Podiatry

## 2018-05-31 NOTE — Progress Notes (Signed)
No show  Message sent to get her scheduled

## 2018-06-01 ENCOUNTER — Ambulatory Visit: Payer: Medicaid Other | Admitting: Podiatry

## 2018-06-13 ENCOUNTER — Ambulatory Visit: Payer: Medicaid Other

## 2018-06-13 ENCOUNTER — Encounter: Payer: Medicaid Other | Admitting: Podiatry

## 2018-06-13 DIAGNOSIS — M2042 Other hammer toe(s) (acquired), left foot: Secondary | ICD-10-CM

## 2018-06-13 DIAGNOSIS — M216X2 Other acquired deformities of left foot: Secondary | ICD-10-CM

## 2018-06-13 NOTE — Progress Notes (Signed)
This encounter was created in error - please disregard.

## 2018-06-20 ENCOUNTER — Ambulatory Visit: Payer: Medicaid Other | Admitting: Podiatry

## 2018-06-20 ENCOUNTER — Ambulatory Visit (INDEPENDENT_AMBULATORY_CARE_PROVIDER_SITE_OTHER): Payer: Medicaid Other

## 2018-06-20 ENCOUNTER — Other Ambulatory Visit: Payer: Self-pay

## 2018-06-20 ENCOUNTER — Encounter: Payer: Self-pay | Admitting: Podiatry

## 2018-06-20 ENCOUNTER — Ambulatory Visit (INDEPENDENT_AMBULATORY_CARE_PROVIDER_SITE_OTHER): Payer: Medicaid Other | Admitting: Podiatry

## 2018-06-20 ENCOUNTER — Other Ambulatory Visit: Payer: Self-pay | Admitting: Podiatry

## 2018-06-20 DIAGNOSIS — S92335A Nondisplaced fracture of third metatarsal bone, left foot, initial encounter for closed fracture: Secondary | ICD-10-CM

## 2018-06-20 DIAGNOSIS — M2042 Other hammer toe(s) (acquired), left foot: Secondary | ICD-10-CM

## 2018-06-20 MED ORDER — HYDROCODONE-ACETAMINOPHEN 10-325 MG PO TABS: 1.0000 | ORAL_TABLET | Freq: Three times a day (TID) | ORAL | 0 refills | Status: DC | PRN

## 2018-06-21 ENCOUNTER — Encounter: Payer: Self-pay | Admitting: Podiatry

## 2018-06-21 NOTE — Progress Notes (Signed)
She presents today for follow-up of capsulitis and pain to the third metatarsal phalangeal joint of the left foot.  States that it still hurts a lot in the ball of my foot but minimal on it is really does not bother me that much it bothers me more when I am off of the foot.  Objective: Vital signs are stable alert and oriented x3.  Pulses are palpable.  Pain on palpation of the third metatarsal head.  Radiographs taken today demonstrate a fracture of the third metatarsal head just behind the subcortical bone slightly displaced there is a small area of comminution.  Medially displaced slightly.  Assessment: Probably well was a plantarflexed third metatarsal is now fractured at the subcortical bone area of the head.  Plan: I instructed her to get back in her Cam walker I will follow-up with her in about a month or so and another set of x-rays to be done then.

## 2018-06-23 ENCOUNTER — Ambulatory Visit: Payer: Self-pay | Admitting: Gastroenterology

## 2018-07-13 ENCOUNTER — Encounter (HOSPITAL_COMMUNITY): Payer: Self-pay

## 2018-07-13 ENCOUNTER — Ambulatory Visit (HOSPITAL_COMMUNITY)
Admission: EM | Admit: 2018-07-13 | Discharge: 2018-07-13 | Disposition: A | Discharge status: Home / Self Care | Payer: Medicaid Other | Attending: Family Medicine | Admitting: Family Medicine

## 2018-07-13 DIAGNOSIS — S39012A Strain of muscle, fascia and tendon of lower back, initial encounter: Secondary | ICD-10-CM | POA: Diagnosis not present

## 2018-07-13 MED ORDER — CYCLOBENZAPRINE HCL 10 MG PO TABS: 10.0000 mg | ORAL_TABLET | Freq: Every evening | ORAL | 0 refills | Status: DC | PRN

## 2018-07-13 MED ORDER — DICLOFENAC SODIUM 75 MG PO TBEC: 75.0000 mg | DELAYED_RELEASE_TABLET | Freq: Two times a day (BID) | ORAL | 0 refills | Status: DC

## 2018-07-13 NOTE — ED Provider Notes (Signed)
[facility]   [id number] 07/13/18 Arrival Time: 3664  ASSESSMENT & PLAN:  1. Strain of lumbar region, initial encounter    Able to ambulate here and hemodynamically stable. No indication for imaging of back at this time given no trauma and normal neurological exam. Discussed.  Meds ordered this encounter  Medications  . diclofenac (VOLTAREN) 75 MG EC tablet    Sig: Take 1 tablet (75 mg total) by mouth 2 (two) times daily.    Dispense:  14 tablet    Refill:  0  . cyclobenzaprine (FLEXERIL) 10 MG tablet    Sig: Take 1 tablet (10 mg total) by mouth at bedtime as needed for muscle spasms.    Dispense:  10 tablet    Refill:  0   Medication sedation precautions given. Encourage ROM/movement as tolerated. See AVS for d/c instructions.  Reviewed expectations re: course of current medical issues. Questions answered. Outlined signs and symptoms indicating need for more acute intervention. Patient verbalized understanding. After Visit Summary given.   SUBJECTIVE: History from: patient.  Tonya Mckenzie is a 58 y.o. female who presents with complaint of intermittent bilateral lower back discomfort. Onset gradual, "about one week ago". Injury/trama: no, but questions relation to lifting heavy items at work. History of back problems: occasional. Discomfort described as aching without radiation. Pain worsens with certain movements/prolonged standing and is improved with rest. Progressive LE weakness or saddle anesthesia: none. Extremity sensation changes or weakness: none. Ambulatory without difficulty. Normal bowel/bladder habits: yes, without urinary retention. No associated abdominal pain/n/v. Self treatment: OTC Tylenol without much relief.  Reports no chronic steroid use, fevers, IV drug use, or recent back surgeries or procedures.  ROS: As per HPI. All other systems negative.   OBJECTIVE:  Vitals:   07/13/18 1342  BP: 112/76  Pulse: 74  Resp: 16  Temp: 97.8 F (36.6  C)  TempSrc: Oral  SpO2: 98%    General appearance: alert; no distress Neck: supple with FROM; without midline tenderness CV: RRR Lungs: unlabored respirations; symmetrical air entry Abdomen: soft, non-tender; non-distended Back: mild to moderate bilateral tenderness of her lower paraspinal musculature; FROM at waist; bruising: none; without midline tenderness Extremities: no edema; symmetrical with no gross deformities; normal ROM of bilateral lower extremities Skin: warm and dry Neurologic: normal gait; normal reflexes of RLE and LLE; normal sensation of RLE and LLE; normal strength of RLE and LLE Psychological: alert and cooperative; normal mood and affect  No Known Allergies  Past Medical History:  Diagnosis Date  . Allergic rhinitis   . Anxiety   . Bacterial vaginosis   . Chronic post-traumatic stress disorder (PTSD)   . Cocaine abuse ([place])   . Depression   . ETOH abuse   . GERD (gastroesophageal reflux disease)   . Hyperlipidemia   . Hypertension    off meds due to weight loss  . Marijuana abuse   . Uterine leiomyoma    Social History   Socioeconomic History  . Marital status: Single    Spouse name: Not on file  . Number of children: 4  . Years of education: Not on file  . Highest education level: Not on file  Occupational History  . Occupation: Ship broker  Social Needs  . Financial resource strain: Not on file  . Food insecurity:    Worry: Not on file    Inability: Not on file  . Transportation needs:    Medical: Not on file    Non-medical: Not on file  Tobacco Use  . Smoking status: Former Smoker    Types: Cigarettes  . Smokeless tobacco: Never Used  Substance and Sexual Activity  . Alcohol use: Not Currently    Comment: quit drinking 2016  . Drug use: Yes    Types: Cocaine, Marijuana    Comment: reformed went through rehab  . Sexual activity: Not on file  Lifestyle  . Physical activity:    Days per week: Not on file    Minutes per session: Not  on file  . Stress: Not on file  Relationships  . Social connections:    Talks on phone: Not on file    Gets together: Not on file    Attends religious service: Not on file    Active member of club or organization: Not on file    Attends meetings of clubs or organizations: Not on file    Relationship status: Not on file  . Intimate partner violence:    Fear of current or ex partner: Not on file    Emotionally abused: Not on file    Physically abused: Not on file    Forced sexual activity: Not on file  Other Topics Concern  . Not on file  Social History Narrative  . Not on file   Family History  Problem Relation Age of Onset  . Diabetes Mother   . Hypertension Mother   . Heart attack Father   . Colon cancer Neg Hx   . Esophageal cancer Neg Hx   . Rectal cancer Neg Hx   . Stomach cancer Neg Hx   . Liver cancer Neg Hx   . Breast cancer Neg Hx    Past Surgical History:  Procedure Laterality Date  . ABDOMINAL HYSTERECTOMY     partial  . CHOLECYSTECTOMY    . TOE SURGERY Left   . TONSILLECTOMY    . TYMPANOSTOMY TUBE PLACEMENT       Vanessa Kick, MD 07/27/18 1152

## 2018-07-13 NOTE — Discharge Instructions (Signed)

## 2018-07-13 NOTE — ED Triage Notes (Signed)
Pt present lower back pain that started hurting over a week ago.  Pt states she has been working out extremely and believed she pulled a muscle.

## 2018-07-18 ENCOUNTER — Encounter: Payer: Medicaid Other | Admitting: Podiatry

## 2018-07-18 ENCOUNTER — Ambulatory Visit: Payer: Medicaid Other

## 2018-07-18 DIAGNOSIS — S92335D Nondisplaced fracture of third metatarsal bone, left foot, subsequent encounter for fracture with routine healing: Secondary | ICD-10-CM

## 2018-07-18 NOTE — Progress Notes (Signed)
This encounter was created in error - please disregard.

## 2018-08-02 ENCOUNTER — Other Ambulatory Visit: Payer: Self-pay

## 2018-08-02 ENCOUNTER — Ambulatory Visit (HOSPITAL_COMMUNITY)
Admission: EM | Admit: 2018-08-02 | Discharge: 2018-08-02 | Disposition: A | Discharge status: Home / Self Care | Payer: Medicaid Other | Attending: Family Medicine | Admitting: Family Medicine

## 2018-08-02 ENCOUNTER — Encounter (HOSPITAL_COMMUNITY): Payer: Self-pay | Admitting: Emergency Medicine

## 2018-08-02 DIAGNOSIS — M5442 Lumbago with sciatica, left side: Secondary | ICD-10-CM

## 2018-08-02 DIAGNOSIS — M5441 Lumbago with sciatica, right side: Secondary | ICD-10-CM

## 2018-08-02 MED ORDER — CYCLOBENZAPRINE HCL 5 MG PO TABS: 5.0000 mg | ORAL_TABLET | Freq: Two times a day (BID) | ORAL | 0 refills | Status: DC | PRN

## 2018-08-02 MED ORDER — METHYLPREDNISOLONE ACETATE 80 MG/ML IJ SUSP
INTRAMUSCULAR | Status: AC
  Filled 2018-08-02: qty 1

## 2018-08-02 MED ORDER — IBUPROFEN 800 MG PO TABS: 800.0000 mg | ORAL_TABLET | Freq: Three times a day (TID) | ORAL | 0 refills | Status: DC

## 2018-08-02 MED ORDER — METHYLPREDNISOLONE ACETATE 80 MG/ML IJ SUSP
80.0000 mg | Freq: Once | INTRAMUSCULAR | Status: AC
  Administered 2018-08-02: 80 mg via INTRAMUSCULAR

## 2018-08-02 NOTE — Discharge Instructions (Signed)
Your leg pain is likely coming from your lower back pain/nerve irritation We gave you an injection of Depo-Medrol today which is a steroid to help reduce inflammation and irritation on any nerves Use anti-inflammatories for pain/swelling. You may take up to 800 mg Ibuprofen every 8 hours with food. You may supplement Ibuprofen with Tylenol [phone] mg every 8 hours.  You may use flexeril as needed to help with pain. This is a muscle relaxer and causes sedation- please use only at bedtime or when you will be home and not have to drive/work Avoid heavy lifting Please perform gentle back exercises/stretching after initial pain subsides-see attached Follow-up if symptoms not improving or worsening

## 2018-08-02 NOTE — ED Triage Notes (Signed)
Feels like muscles in legs ar pulling.  Patient compares to "electricity shooting down both legs"  Discomfort occurs only when walking

## 2018-08-02 NOTE — ED Provider Notes (Signed)
[place]    CSN: [id number] Arrival date & time: 08/02/18  1334     History   Chief Complaint Chief Complaint  Patient presents with  . Leg Pain    HPI Tonya Mckenzie is a 58 y.o. female history of cocaine abuse, hypertension, GERD, hyperlipidemia, presenting today for evaluation of bilateral back and leg pain.  Patient states that over the past couple weeks she has noticed a shooting sensation down the back of both of her legs.  States that it starts in her lower back and is a sharp electric-like pain.  She notes that she is previously had 2 remote foot surgeries and first noted this sensation while healing, but the sensation that she has felt recently feels similar to what she had felt previously.  Denies any new injury.  Denies any recent heavy lifting.  She does note that she works out approximately every 3 days and believes she was doing an avid exercise without a mat which may have triggered her symptoms.  She feels a knot in her right lower lumbar region.  Pain mainly with walking, minimal pain at rest.  Denies any saddle anesthesia.  Denies issues with urination or bowel movements.  Denies weakness in legs.  She was seen here approximately 1 week ago and treated with diclofenac and Flexeril.  She had some mild relief with these medicines, but her symptoms have persisted.  She notes that she only has 2 Flexeril left.  HPI  Past Medical History:  Diagnosis Date  . Allergic rhinitis   . Anxiety   . Bacterial vaginosis   . Chronic post-traumatic stress disorder (PTSD)   . Cocaine abuse ([place])   . Depression   . ETOH abuse   . GERD (gastroesophageal reflux disease)   . Hyperlipidemia   . Hypertension    off meds due to weight loss  . Marijuana abuse   . Uterine leiomyoma     Patient Active Problem List   Diagnosis Date Noted  . 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 MDD (major depressive disorder), recurrent severe, without  psychosis ([place]) 07/09/2017    Past Surgical History:  Procedure Laterality Date  . ABDOMINAL HYSTERECTOMY     partial  . CHOLECYSTECTOMY    . TOE SURGERY Left   . TONSILLECTOMY    . TYMPANOSTOMY TUBE PLACEMENT      OB History   No obstetric history on file.      Home Medications    Prior to Admission medications   Medication Sig Start Date End Date Taking? Authorizing Provider  cloNIDine (CATAPRES) 0.2 MG tablet Take 1 tablet (0.2 mg total) by mouth daily. 07/12/17  Yes Patrecia Pour, NP  dexlansoprazole (DEXILANT) 60 MG capsule Take 60 mg by mouth daily.   Yes [provider]  acyclovir (ZOVIRAX) 400 MG tablet TK 1 T PO TID 02/20/18   [provider]  cyclobenzaprine (FLEXERIL) 5 MG tablet Take 1-2 tablets (5-10 mg total) by mouth 2 (two) times daily as needed for muscle spasms. 08/02/18   Joselin Crandell C, PA-C  ibuprofen (ADVIL) 800 MG tablet Take 1 tablet (800 mg total) by mouth 3 (three) times daily. 08/02/18   Selenia Mihok C, PA-C  valACYclovir (VALTREX) 500 MG tablet TK 1 T PO BID 02/27/18   [provider]    Family History Family History  Problem Relation Age of Onset  . Diabetes Mother   . Hypertension Mother   .  Heart attack Father   . Colon cancer Neg Hx   . Esophageal cancer Neg Hx   . Rectal cancer Neg Hx   . Stomach cancer Neg Hx   . Liver cancer Neg Hx   . Breast cancer Neg Hx     Social History Social History   Tobacco Use  . Smoking status: Former Smoker    Types: Cigarettes  . Smokeless tobacco: Never Used  Substance Use Topics  . Alcohol use: Not Currently    Comment: quit drinking 2016  . Drug use: Yes    Types: Cocaine, Marijuana    Comment: reformed went through rehab     Allergies   Patient has no known allergies.   Review of Systems Review of Systems  Constitutional: Negative for fatigue and fever.  Eyes: Negative for visual disturbance.  Respiratory: Negative for shortness of breath.    Cardiovascular: Negative for chest pain.  Gastrointestinal: Negative for abdominal pain, nausea and vomiting.  Genitourinary: Negative for decreased urine volume and difficulty urinating.  Musculoskeletal: Positive for back pain and myalgias. Negative for arthralgias and joint swelling.  Skin: Negative for color change, rash and wound.  Neurological: Negative for dizziness, weakness, light-headedness and headaches.     Physical Exam Triage Vital Signs ED Triage Vitals  Enc Vitals Group     BP 08/02/18 1347 133/85     Pulse Rate 08/02/18 1347 64     Resp 08/02/18 1347 16     Temp 08/02/18 1347 98.1 F (36.7 C)     Temp Source 08/02/18 1347 Oral     SpO2 08/02/18 1347 97 %     Weight --      Height --      Head Circumference --      Peak Flow --      Pain Score 08/02/18 1344 10     Pain Loc --      Pain Edu? --      Excl. in [place]? --    No data found.  Updated Vital Signs BP 133/85 (BP Location: Right Arm)   Pulse 64   Temp 98.1 F (36.7 C) (Oral)   Resp 16   SpO2 97%   Visual Acuity Right Eye Distance:   Left Eye Distance:   Bilateral Distance:    Right Eye Near:   Left Eye Near:    Bilateral Near:     Physical Exam Vitals signs and nursing note reviewed.  Constitutional:      Appearance: She is well-developed.     Comments: No acute distress  HENT:     Head: Normocephalic and atraumatic.     Nose: Nose normal.  Eyes:     Conjunctiva/sclera: Conjunctivae normal.  Neck:     Musculoskeletal: Neck supple.  Cardiovascular:     Rate and Rhythm: Normal rate.  Pulmonary:     Effort: Pulmonary effort is normal. No respiratory distress.  Abdominal:     General: There is no distension.  Musculoskeletal: Normal range of motion.     Comments: Ambulates from chair to exam table independently and without abnormality  Nontender to palpation along cervical, thoracic and lumbar spine midline, mild tenderness throughout bilateral paraspinal and lumbar musculature,  palpable knot located in right paraspinal lumbar region Negative straight leg raise bilaterally Strength 5/5 and equal bilaterally at hips and knees, patellar reflex 1+ bilaterally  Skin:    General: Skin is warm and dry.  Neurological:     Mental Status: She is  alert and oriented to person, place, and time.      UC Treatments / Results  Labs (all labs ordered are listed, but only abnormal results are displayed) Labs Reviewed - No data to display  EKG None  Radiology No results found.  Procedures Procedures (including critical care time)  Medications Ordered in UC Medications  methylPREDNISolone acetate (DEPO-MEDROL) injection 80 mg (80 mg Intramuscular Given 08/02/18 1420)    Initial Impression / Assessment and Plan / UC Course  I have reviewed the triage vital signs and the nursing notes.  Pertinent labs & imaging results that were available during my care of the patient were reviewed by me and considered in my medical decision making (see chart for details).     Shooting pain in legs likely secondary to back pain, negative straight leg raises, but clinical symptoms suggestive of sciatica.  No specific injury, do not suspect underlying fracture.  No red flags for cauda equina.  Has tried NSAIDs with out complete resolution, will provide Depo-Medrol IM prior to discharge today and continue with NSAIDs of Tylenol and ibuprofen.  Continue Flexeril as needed.  Gentle back exercises and stretching.  Continue to monitor,Discussed strict return precautions. Patient verbalized understanding and is agreeable with plan.  Final Clinical Impressions(s) / UC Diagnoses   Final diagnoses:  Acute bilateral low back pain with bilateral sciatica     Discharge Instructions     Your leg pain is likely coming from your lower back pain/nerve irritation We gave you an injection of Depo-Medrol today which is a steroid to help reduce inflammation and irritation on any nerves Use  anti-inflammatories for pain/swelling. You may take up to 800 mg Ibuprofen every 8 hours with food. You may supplement Ibuprofen with Tylenol [phone] mg every 8 hours.  You may use flexeril as needed to help with pain. This is a muscle relaxer and causes sedation- please use only at bedtime or when you will be home and not have to drive/work Avoid heavy lifting Please perform gentle back exercises/stretching after initial pain subsides-see attached Follow-up if symptoms not improving or worsening   ED Prescriptions    Medication Sig Dispense Auth. Provider   ibuprofen (ADVIL) 800 MG tablet Take 1 tablet (800 mg total) by mouth 3 (three) times daily. 21 tablet Anysha Frappier C, PA-C   cyclobenzaprine (FLEXERIL) 5 MG tablet Take 1-2 tablets (5-10 mg total) by mouth 2 (two) times daily as needed for muscle spasms. 24 tablet Naftali Carchi, [place] C, PA-C     Controlled Substance Prescriptions [place] Controlled Substance Registry consulted? Not Applicable   Janith Lima, [place] 08/02/18 1426

## 2018-08-09 ENCOUNTER — Ambulatory Visit: Payer: Medicaid Other | Admitting: Gastroenterology

## 2018-08-22 ENCOUNTER — Ambulatory Visit (INDEPENDENT_AMBULATORY_CARE_PROVIDER_SITE_OTHER): Payer: Medicaid Other | Admitting: Gastroenterology

## 2018-08-22 VITALS — Ht 66.0 in | Wt 168.0 lb

## 2018-08-22 DIAGNOSIS — R109 Unspecified abdominal pain: Secondary | ICD-10-CM | POA: Diagnosis not present

## 2018-08-22 NOTE — Progress Notes (Signed)
This patient contacted our office requesting a physician telemedicine consultation regarding clinical questions and/or test results. This patient contacted our office requesting a physician telemedicine consultation regarding clinical questions and/or test results.   Participants on the conference : myself and patient   The patient consented to this consultation and was aware that a charge will be placed through their insurance.  I was in my office and the patient was at home   Encounter time:  Total time 16 minutes, all spent with patient on the telephone.   _____________________________________________________________________________________________            Tonya Mckenzie GI Progress Note  Chief Complaint: Left upper quadrant pain  Subjective  History: Seen multiple times in the last several years for chronic burning upper abdominal discomfort, usually more left-sided.  Normal colonoscopy March 2016.  Normal upper endoscopy February 2018 including normal gastric biopsies.  Last seen in 2019 with symptoms still consistent with musculoskeletal abdominal  wall pain.  She was in the emergency department mid May and early June, both visits for low back pain.  Tonya Mckenzie recently contacted our office asking for evaluation of ongoing pain.  She still has LUQ pain, same as before.  It comes and goes, worse with movements as before.  Actually, when she for started talking, she was telling me about her chronic foot pain and thought that I was her podiatrist.  This left upper quadrant pain has not changed since I last spoke with her, as near as can be determined.  It is not worsened by eating bowel movements, there is no rectal bleeding vomiting or weight loss.  As before, she is fixated on a "bruised spleen" that she believes was discovered at some point in the past as work-up for this pain.  ROS: Cardiovascular:  no chest pain Respiratory: no dyspnea Has chronic low back pain chronic  foot pain  The patient's Past Medical, Family and Social History were reviewed and are on file in the EMR.  Objective:  Med list reviewed  Current Outpatient Medications:  .  acyclovir (ZOVIRAX) 400 MG tablet, TK 1 T PO TID, Disp: , Rfl:  .  cloNIDine (CATAPRES) 0.2 MG tablet, Take 1 tablet (0.2 mg total) by mouth daily., Disp: 30 tablet, Rfl: 0 .  cyclobenzaprine (FLEXERIL) 5 MG tablet, Take 1-2 tablets (5-10 mg total) by mouth 2 (two) times daily as needed for muscle spasms., Disp: 24 tablet, Rfl: 0 .  dexlansoprazole (DEXILANT) 60 MG capsule, Take 60 mg by mouth daily., Disp: , Rfl:  .  ibuprofen (ADVIL) 800 MG tablet, Take 1 tablet (800 mg total) by mouth 3 (three) times daily., Disp: 21 tablet, Rfl: 0 .  valACYclovir (VALTREX) 500 MG tablet, TK 1 T PO BID, Disp: , Rfl:     No exam-virtual visit  Recent Labs:  No recent labs  Abdominal CT scan in 2010 (previously noted and reviewed with patient, but reviewed again with her today including results of previous GI work-up).  That scan was being done for left upper quadrant pain that apparently occurred after an injury.   @ASSESSMENTPLANBEGIN @ Assessment: Encounter Diagnosis  Name Primary?  . Abdominal wall pain Yes   Tonya Mckenzie kept telling me about this pain, was fixated on the possibility of a spleen injury, and wanted to know what else could be done about this.  I did my best to explain the extensive GI work-up to date, and that she has no discernible digestive cause for this pain.  Previous testing shows  it to be abdominal wall muscle pain, and she has previously been referred back to primary care for evaluation.  She has other chronic musculoskeletal complaints as well, and also tends to frequent the emergency department.  In fact, as I was unable to provide a solution to the pain today, she told me that she might go to the hospital and asked to be checked in so that "they could get to the bottom of this".  There seems to be some  secondary gain involved here.   Plan: This patient needs no further GI testing regarding this chronic muscular pain.   Tonya Mckenzie III

## 2018-08-29 ENCOUNTER — Ambulatory Visit (INDEPENDENT_AMBULATORY_CARE_PROVIDER_SITE_OTHER): Payer: Medicaid Other

## 2018-08-29 ENCOUNTER — Ambulatory Visit: Payer: Medicaid Other | Admitting: Podiatry

## 2018-08-29 ENCOUNTER — Other Ambulatory Visit: Payer: Self-pay

## 2018-08-29 ENCOUNTER — Encounter: Payer: Self-pay | Admitting: Podiatry

## 2018-08-29 VITALS — Temp 97.5°F

## 2018-08-29 DIAGNOSIS — S92335D Nondisplaced fracture of third metatarsal bone, left foot, subsequent encounter for fracture with routine healing: Secondary | ICD-10-CM

## 2018-08-29 NOTE — Progress Notes (Signed)
She presents today chief concern of painful second toe left foot and a painful nodule to the plantar aspect third metatarsal left foot.  She states that I like to have this fixation removed and we need to do something the tube to does not.  Objective: Vital signs are stable alert oriented x3.  Pulses are palpable.  She has a palpable plantarflexed third metatarsal of the left foot and painful internal fixation second toe left foot.  Answered all questions regarding these procedures.  She understands that this is a bit of surgical intervention.  Assessment: Pain in limb secondary to painful internal fixation and plantarflexed third metatarsal left.  Plan: Consented her today for third metatarsal osteotomy removal of screw toe second left. We went over the consent form today line by line number by number giving her ample time "she saw fit regarding these procedures answering best my ability layman's terms she understood was amenable to it signed Arnell Sieving page of the consent form.

## 2018-08-29 NOTE — Patient Instructions (Signed)
Pre-Operative Instructions  Congratulations, you have decided to take an important step towards improving your quality of life.  You can be assured that the doctors and staff at [facility] will be with you every step of the way.  Here are some important things you should know:  1. Plan to be at the surgery center/hospital at least 1 (one) hour prior to your scheduled time, unless otherwise directed by the surgical center/hospital staff.  You must have a responsible adult accompany you, remain during the surgery and drive you home.  Make sure you have directions to the surgical center/hospital to ensure you arrive on time. 2. If you are having surgery at [facility] or [facility], you will need a copy of your medical history and physical form from your family physician within one month prior to the date of surgery. We will give you a form for your primary physician to complete.  3. We make every effort to accommodate the date you request for surgery.  However, there are times where surgery dates or times have to be moved.  We will contact you as soon as possible if a change in schedule is required.   4. No aspirin/ibuprofen for one week before surgery.  If you are on aspirin, any non-steroidal anti-inflammatory medications (Mobic, Aleve, Ibuprofen) should not be taken seven (7) days prior to your surgery.  You make take Tylenol for pain prior to surgery.  5. Medications - If you are taking daily heart and blood pressure medications, seizure, reflux, allergy, asthma, anxiety, pain or diabetes medications, make sure you notify the surgery center/hospital before the day of surgery so they can tell you which medications you should take or avoid the day of surgery. 6. No food or drink after midnight the night before surgery unless directed otherwise by surgical center/hospital staff. 7. No alcoholic beverages 24-hours prior to surgery.  No smoking 24-hours prior or 24-hours after  surgery. 8. Wear loose pants or shorts. They should be loose enough to fit over bandages, boots, and casts. 9. Don't wear slip-on shoes. Sneakers are preferred. 10. Bring your boot with you to the surgery center/hospital.  Also bring crutches or a walker if your physician has prescribed it for you.  If you do not have this equipment, it will be provided for you after surgery. 11. If you have not been contacted by the surgery center/hospital by the day before your surgery, call to confirm the date and time of your surgery. 12. Leave-time from work may vary depending on the type of surgery you have.  Appropriate arrangements should be made prior to surgery with your employer. 13. Prescriptions will be provided immediately following surgery by your doctor.  Fill these as soon as possible after surgery and take the medication as directed. Pain medications will not be refilled on weekends and must be approved by the doctor. 14. Remove nail polish on the operative foot and avoid getting pedicures prior to surgery. 15. Wash the night before surgery.  The night before surgery wash the foot and leg well with water and the antibacterial soap provided. Be sure to pay special attention to beneath the toenails and in between the toes.  Wash for at least three (3) minutes. Rinse thoroughly with water and dry well with a towel.  Perform this wash unless told not to do so by your physician.  Enclosed: 1 Ice pack (please put in freezer the night before surgery)   1 Hibiclens skin cleaner     Pre-op instructions  If you have any questions regarding the instructions, please do not hesitate to call our office.  [place]: [address], [place], [place] [address] -- [phone]  [place]: [address] [place], [place] [address] -- [phone]  [place]: [address]  [place], [place] [address] -- [phone]  [place]: [address], [address], [place], [place] [address] -- [phone]  Website:  [email or web address] 

## 2018-10-09 ENCOUNTER — Emergency Department (HOSPITAL_COMMUNITY)
Admission: EM | Admit: 2018-10-09 | Discharge: 2018-10-09 | Disposition: A | Discharge status: Home / Self Care | Payer: Medicaid Other | Attending: Emergency Medicine | Admitting: Emergency Medicine

## 2018-10-09 ENCOUNTER — Encounter (HOSPITAL_COMMUNITY): Payer: Self-pay

## 2018-10-09 ENCOUNTER — Other Ambulatory Visit: Payer: Self-pay

## 2018-10-09 DIAGNOSIS — Z79899 Other long term (current) drug therapy: Secondary | ICD-10-CM | POA: Diagnosis not present

## 2018-10-09 DIAGNOSIS — F1210 Cannabis abuse, uncomplicated: Secondary | ICD-10-CM | POA: Insufficient documentation

## 2018-10-09 DIAGNOSIS — Z87891 Personal history of nicotine dependence: Secondary | ICD-10-CM | POA: Diagnosis not present

## 2018-10-09 DIAGNOSIS — M79605 Pain in left leg: Secondary | ICD-10-CM | POA: Diagnosis not present

## 2018-10-09 DIAGNOSIS — F1410 Cocaine abuse, uncomplicated: Secondary | ICD-10-CM | POA: Diagnosis not present

## 2018-10-09 DIAGNOSIS — M79604 Pain in right leg: Secondary | ICD-10-CM | POA: Insufficient documentation

## 2018-10-09 DIAGNOSIS — I10 Essential (primary) hypertension: Secondary | ICD-10-CM | POA: Diagnosis not present

## 2018-10-09 MED ORDER — PREDNISONE 20 MG PO TABS
60.0000 mg | ORAL_TABLET | Freq: Once | ORAL | Status: AC
  Administered 2018-10-09: 60 mg via ORAL
  Filled 2018-10-09: qty 3

## 2018-10-09 MED ORDER — PREDNISONE 20 MG PO TABS: ORAL_TABLET | ORAL | 0 refills | Status: DC

## 2018-10-09 NOTE — ED Notes (Signed)
Patient verbalizes understanding of discharge instructions. Opportunity for questioning and answers were provided. Armband removed by staff, pt discharged from ED.  

## 2018-10-09 NOTE — ED Notes (Signed)
States left leg pain aching x 3-4 weeks has seen a dr and told she had low  Vit d and mag , she took those but is no better

## 2018-10-09 NOTE — Discharge Instructions (Signed)
Please read and follow all provided instructions.  Your diagnoses today include:  1. Pain in both lower extremities     Tests performed today include:  Vital signs - see below for your results today  Medications prescribed:   Prednisone - steroid medicine   It is best to take this medication in the morning to prevent sleeping problems. If you are diabetic, monitor your blood sugar closely and stop taking Prednisone if blood sugar is over 300. Take with food to prevent stomach upset.   Take any prescribed medications only as directed.  Home care instructions:   Follow any educational materials contained in this packet  Please rest, use ice or heat on your back for the next several days  Do not lift, push, pull anything more than 10 pounds for the next week  Follow-up instructions: Please follow-up with your primary care provider or the spine doctor listed in the next 1 week for further evaluation of your symptoms.   Return instructions:  SEEK IMMEDIATE MEDICAL ATTENTION IF YOU HAVE:  New numbness, tingling, weakness, or problem with the use of your arms or legs  Severe back pain not relieved with medications  Loss control of your bowels or bladder  Increasing pain in any areas of the body (such as chest or abdominal pain)  Shortness of breath, dizziness, or fainting.   Worsening nausea (feeling sick to your stomach), vomiting, fever, or sweats  Any other emergent concerns regarding your health   Additional Information:  Your vital signs today were: BP (!) 161/91 (BP Location: Right Arm)    Pulse 69    Temp 98.3 F (36.8 C) (Oral)    Resp 18    Ht 5\' 6"  (1.676 m)    Wt 74.8 kg    SpO2 97%    BMI 26.63 kg/m  If your blood pressure (BP) was elevated above 135/85 this visit, please have this repeated by your doctor within one month. --------------

## 2018-10-09 NOTE — ED Triage Notes (Signed)
Pt arrives POV for eval of blt leg pain/foot pain. Pt reports "it feels as though something is pulling the muscles from my legs through my feet". Pt reports this pain x 3 weeks,

## 2018-10-09 NOTE — ED Provider Notes (Signed)
[facility] EMERGENCY DEPARTMENT Provider Note   CSN: [id number] Arrival date & time: 10/09/18  1343     History   Chief Complaint Chief Complaint  Patient presents with  . Leg Pain  . Foot Pain    HPI Tonya Mckenzie is a 58 y.o. female.     Patient presents to the emergency department with complaint of bilateral lower extremity pain ongoing over the past several weeks however has been present prior to this as well.  Patient describes feeling like the muscles in her legs are being pulled through the bottom of her feet.  This has been waxing and waning.  It was causing her difficulty walking.  Her feet legs feel generally weak and feel like they are giving out at times.  She denies falls.  Denies any back pain.  The pain starts up at the top of her buttocks and radiates down into her legs.  She has never had any surgeries on her back.  She is been taking over-the-counter medications and muscle relaxers at home without any improvement.  When patient was seen at urgent care several months ago for similar symptoms, she was given a shot of steroids which she states helped temporarily.  Patient denies warning symptoms of back pain including: fecal incontinence, urinary retention or overflow incontinence, night sweats, waking from sleep with back pain, unexplained fevers or weight loss, h/o cancer, IVDU, recent trauma.        Past Medical History:  Diagnosis Date  . Allergic rhinitis   . Anxiety   . Bacterial vaginosis   . Chronic post-traumatic stress disorder (PTSD)   . Cocaine abuse ([place])   . Depression   . ETOH abuse   . GERD (gastroesophageal reflux disease)   . Hyperlipidemia   . Hypertension    off meds due to weight loss  . Marijuana abuse   . Uterine leiomyoma     Patient Active Problem List   Diagnosis Date Noted  . 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 MDD (major depressive disorder), recurrent severe, without  psychosis ([place]) 07/09/2017    Past Surgical History:  Procedure Laterality Date  . ABDOMINAL HYSTERECTOMY     partial  . CHOLECYSTECTOMY    . TOE SURGERY Left   . TONSILLECTOMY    . TYMPANOSTOMY TUBE PLACEMENT       OB History   No obstetric history on file.      Home Medications    Prior to Admission medications   Medication Sig Start Date End Date Taking? Authorizing Provider  acyclovir (ZOVIRAX) 400 MG tablet TK 1 T PO TID 02/20/18   [provider]  cloNIDine (CATAPRES) 0.2 MG tablet Take 1 tablet (0.2 mg total) by mouth daily. 07/12/17   Patrecia Pour, NP  cyclobenzaprine (FLEXERIL) 5 MG tablet Take 1-2 tablets (5-10 mg total) by mouth 2 (two) times daily as needed for muscle spasms. 08/02/18   Wieters, Hallie C, PA-C  dexlansoprazole (DEXILANT) 60 MG capsule Take 60 mg by mouth daily.    [provider]  fluconazole (DIFLUCAN) 150 MG tablet TK 1 T PO QD 08/23/18   [provider]  ibuprofen (ADVIL) 800 MG tablet Take 1 tablet (800 mg total) by mouth 3 (three) times daily. 08/02/18   Wieters, Hallie C, PA-C  metroNIDAZOLE (FLAGYL) 500 MG tablet TK 4 TS PO  FOR 1 DOSE. 08/23/18   [provider]  NAPROXEN DR 500 MG EC tablet  TK 1 T PO BID PRN P 08/25/18   [provider]  omeprazole (PRILOSEC) 20 MG capsule TK 1 C PO QD PRN 08/04/18   [provider]  predniSONE (DELTASONE) 20 MG tablet 3 Tabs PO Days 1-3, then 2 tabs PO Days 4-6, then 1 tab PO Day 7-9, then Half Tab PO Day 10-12 10/09/18   Carlisle Cater, PA-C  QUEtiapine (SEROQUEL) 25 MG tablet TK 1 T PO QHS 07/05/18   [provider]  tiZANidine (ZANAFLEX) 4 MG tablet TK 1 T PO BID PRN 08/26/18   [provider]  valACYclovir (VALTREX) 500 MG tablet TK 1 T PO BID 02/27/18   [provider]  Vitamin D, Ergocalciferol, (DRISDOL) 1.25 MG (50000 UT) CAPS capsule TK 1 C PO ONCE A WEEK 08/25/18   [provider]    Family History Family History   Problem Relation Age of Onset  . Diabetes Mother   . Hypertension Mother   . Heart attack Father   . Colon cancer Neg Hx   . Esophageal cancer Neg Hx   . Rectal cancer Neg Hx   . Stomach cancer Neg Hx   . Liver cancer Neg Hx   . Breast cancer Neg Hx     Social History Social History   Tobacco Use  . Smoking status: Former Smoker    Types: Cigarettes  . Smokeless tobacco: Never Used  Substance Use Topics  . Alcohol use: Not Currently    Comment: quit drinking 2016  . Drug use: Yes    Types: Cocaine, Marijuana    Comment: reformed went through rehab     Allergies   Patient has no known allergies.   Review of Systems Review of Systems  Constitutional: Negative for fever and unexpected weight change.  Gastrointestinal: Negative for constipation.       Negative for fecal incontinence.   Genitourinary: Negative for dysuria, flank pain, hematuria, pelvic pain, vaginal bleeding and vaginal discharge.       Negative for urinary incontinence or retention.  Musculoskeletal: Positive for back pain and myalgias.  Neurological: Positive for weakness (generalized). Negative for numbness.       Denies saddle paresthesias.     Physical Exam Updated Vital Signs BP (!) 161/91 (BP Location: Right Arm)   Pulse 69   Temp 98.3 F (36.8 C) (Oral)   Resp 18   Ht 5\' 6"  (1.676 m)   Wt 74.8 kg   SpO2 97%   BMI 26.63 kg/m   Physical Exam Vitals signs and nursing note reviewed.  Constitutional:      Appearance: She is well-developed.  HENT:     Head: Normocephalic and atraumatic.  Eyes:     Conjunctiva/sclera: Conjunctivae normal.  Neck:     Musculoskeletal: Normal range of motion and neck supple.  Pulmonary:     Effort: Pulmonary effort is normal.  Abdominal:     Palpations: Abdomen is soft.     Tenderness: There is no abdominal tenderness.  Musculoskeletal: Normal range of motion.     Cervical back: She exhibits normal range of motion, no tenderness and no bony  tenderness.     Thoracic back: She exhibits normal range of motion, no tenderness and no bony tenderness.     Lumbar back: She exhibits tenderness. She exhibits normal range of motion and no bony tenderness.       Back:     Comments: No step-off noted with palpation of spine.   Skin:  General: Skin is warm and dry.     Findings: No rash.  Neurological:     Mental Status: She is alert.     Sensory: No sensory deficit.     Deep Tendon Reflexes: Reflexes are normal and symmetric.     Comments: 5/5 strength in entire lower extremities bilaterally. No sensation deficit.  Patient is able to stand from sitting and walk across the room without any foot drop, apparent weakness, or instability.      ED Treatments / Results  Labs (all labs ordered are listed, but only abnormal results are displayed) Labs Reviewed - No data to display  EKG None  Radiology No results found.  Procedures Procedures (including critical care time)  Medications Ordered in ED Medications  predniSONE (DELTASONE) tablet 60 mg (has no administration in time range)     Initial Impression / Assessment and Plan / ED Course  I have reviewed the triage vital signs and the nursing notes.  Pertinent labs & imaging results that were available during my care of the patient were reviewed by me and considered in my medical decision making (see chart for details).        Patient seen and examined. Medications ordered.   Vital signs reviewed and are as follows: Vitals:   10/09/18 1359  BP: (!) 161/91  Pulse: 69  Resp: 18  Temp: 98.3 F (36.8 C)  SpO2: 97%   Lower extremity exam is normal.  Neurovascular issues noted.  Symptoms seem most likely related to her back.  She did have lumbar radiculopathy or spinal stenosis causing her symptoms.  We will start on course of prednisone as steroids have helped her in the past.  She will need to follow-up with her primary care doctor or neurosurgery referral given for  further evaluation.  No red flag s/s of low back pain. Patient was counseled on back pain precautions and told to do activity as tolerated but do not lift, push, or pull heavy objects more than 10 pounds for the next week.  Patient counseled to use ice or heat on back for no longer than 15 minutes every hour.   Patient urged to follow-up with PCP if pain does not improve with treatment and rest or if pain becomes recurrent. Urged to return with worsening severe pain, loss of bowel or bladder control, trouble walking.   The patient verbalizes understanding and agrees with the plan.  Final Clinical Impressions(s) / ED Diagnoses   Final diagnoses:  Pain in both lower extremities   Patient with bilateral lower extremity pain.  No neurological deficits on exam. Patient is ambulatory.  No signs of DVT, arterial insufficiency, cellulitis.  No warning symptoms of back pain including: fecal incontinence, urinary retention or overflow incontinence, night sweats, waking from sleep with back pain, unexplained fevers or weight loss, h/o cancer, IVDU, recent trauma. No concern for cauda equina, epidural abscess, or other serious cause of back pain. Conservative measures such as rest, ice/heat and pain medicine indicated with PCP follow-up if no improvement with conservative management.     ED Discharge Orders         Ordered    predniSONE (DELTASONE) 20 MG tablet     10/09/18 1641           Carlisle Cater, PA-C 10/09/18 1649    Carmin Muskrat, MD 10/09/18 2011

## 2018-10-31 ENCOUNTER — Telehealth: Payer: Self-pay | Admitting: *Deleted

## 2018-10-31 NOTE — Telephone Encounter (Signed)
"  The reason for this call is that I am scheduled for surgery in September.  I want to cancel the surgery because I haven't been having any problems with my foot.  So I don't feel like I need surgery because it's not bothering me at all.  If I do in the near future, I will call and let you know.  Please call me back at your most earliest convenience.  Be safe and have a blessed day."  I am returning your call.  I got your message.  I will cancel your surgery.  "Okay, because my foot is not bothering me."  I'll let Dr. Milinda Pointer know and cancel it at the surgery center.   I canceled her surgery that was scheduled for 11/24/2018 via the surgical center's [facility].

## 2018-12-06 ENCOUNTER — Ambulatory Visit: Payer: Medicaid Other | Admitting: Physical Therapy

## 2018-12-07 ENCOUNTER — Ambulatory Visit: Payer: Medicaid Other | Attending: Student | Admitting: Physical Therapy

## 2019-01-15 ENCOUNTER — Other Ambulatory Visit: Payer: Self-pay | Admitting: Family Medicine

## 2019-01-15 ENCOUNTER — Other Ambulatory Visit: Payer: Self-pay | Admitting: Internal Medicine

## 2019-01-15 DIAGNOSIS — Z1231 Encounter for screening mammogram for malignant neoplasm of breast: Secondary | ICD-10-CM

## 2019-03-08 ENCOUNTER — Ambulatory Visit: Payer: Medicaid Other

## 2019-03-13 ENCOUNTER — Other Ambulatory Visit: Payer: Self-pay

## 2019-03-13 ENCOUNTER — Encounter: Payer: Self-pay | Admitting: Family Medicine

## 2019-03-13 ENCOUNTER — Ambulatory Visit (INDEPENDENT_AMBULATORY_CARE_PROVIDER_SITE_OTHER): Payer: Medicaid Other | Admitting: Family Medicine

## 2019-03-13 VITALS — BP 122/74 | HR 73 | Wt 170.2 lb

## 2019-03-13 DIAGNOSIS — E785 Hyperlipidemia, unspecified: Secondary | ICD-10-CM | POA: Diagnosis not present

## 2019-03-13 DIAGNOSIS — I10 Essential (primary) hypertension: Secondary | ICD-10-CM | POA: Insufficient documentation

## 2019-03-13 DIAGNOSIS — Z7689 Persons encountering health services in other specified circumstances: Secondary | ICD-10-CM | POA: Insufficient documentation

## 2019-03-13 DIAGNOSIS — Z1159 Encounter for screening for other viral diseases: Secondary | ICD-10-CM | POA: Diagnosis not present

## 2019-03-13 DIAGNOSIS — K219 Gastro-esophageal reflux disease without esophagitis: Secondary | ICD-10-CM | POA: Diagnosis not present

## 2019-03-13 DIAGNOSIS — M79641 Pain in right hand: Secondary | ICD-10-CM

## 2019-03-13 DIAGNOSIS — F1911 Other psychoactive substance abuse, in remission: Secondary | ICD-10-CM

## 2019-03-13 DIAGNOSIS — E663 Overweight: Secondary | ICD-10-CM | POA: Diagnosis not present

## 2019-03-13 DIAGNOSIS — F4310 Post-traumatic stress disorder, unspecified: Secondary | ICD-10-CM

## 2019-03-13 DIAGNOSIS — F329 Major depressive disorder, single episode, unspecified: Secondary | ICD-10-CM | POA: Diagnosis not present

## 2019-03-13 DIAGNOSIS — Z6825 Body mass index (BMI) 25.0-25.9, adult: Secondary | ICD-10-CM | POA: Diagnosis not present

## 2019-03-13 MED ORDER — CELECOXIB 100 MG PO CAPS: 100.0000 mg | ORAL_CAPSULE | ORAL | 0 refills | Status: DC

## 2019-03-13 MED ORDER — PRAVASTATIN SODIUM 40 MG PO TABS: 40.0000 mg | ORAL_TABLET | Freq: Every day | ORAL | 3 refills | Status: DC

## 2019-03-13 NOTE — Assessment & Plan Note (Signed)
Works out regularly and doing well from the standpoint.  Her BMI today is 27.47, do not have any concerns regarding this.  Continue to encourage appropriate exercise regimen and healthy eating.

## 2019-03-13 NOTE — Assessment & Plan Note (Signed)
Patient continues to go to meetings and therapy for this.  She has been clean since 2016.  She was congratulated immensely on this accomplishment and encouraged to continue with her current care.

## 2019-03-13 NOTE — Assessment & Plan Note (Signed)
On clonidine 0.2 mg twice daily, this is a very odd choice for an initial antihypertensive medication.  On chart review, appears to be on it since 2012, cannot find a firm indication as to why this was chosen other other blood pressure medications.  Her blood pressure today is well controlled.  Plan to try some further digging in chart on this, but ultimately would like to transition patient off of this medication via taper and transition to thiazide diuretic or calcium channel blocker.  She was congratulated on her excellent exercise regimen and advised to continue doing this for her health.

## 2019-03-13 NOTE — Assessment & Plan Note (Signed)
Hepatitis C antibody screening for health maintenance.

## 2019-03-13 NOTE — Progress Notes (Signed)
Subjective: Chief Complaint  Patient presents with  . New Patient (Initial Visit)     HPI: Tonya Mckenzie is a 59 y.o. presenting to clinic today to discuss the following:  Establish Care Has spent most of life in [place].   Works out regularly.  No Chest pain SOB when working out. 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 Music producer and singer as well, self-taught, plays keyboard. Just got engaged and is planning her wedding currently.  HTN Has been on clonidine 0.2mg  BID since at least 2012 per chart review.  States that this was the first medication that she was placed on for this and never tried anything else.  Never misses a dose of clonidine.  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 Burney Gauze  Last Pap  2014 Hasn't had abnormal in past per her report  Colonoscopy  Repeat in 10 years  Mammogram [place] for 04/13/2019   Health Maintenance: Due for Pap Smear     ROS noted in HPI. Chief complaint noted.  Past Medical, Surgical, Social, and Family History Reviewed & Updated per EMR.      Social History   Tobacco Use  Smoking Status Former Smoker  . Types: Cigarettes  Smokeless Tobacco Never Used  Tobacco Comment   never really smoked, when she did was about 1 cigarette a month   Smoking status noted.    Objective: BP 122/74   Pulse 73   Wt 170 lb 3.2 oz (77.2 kg)   SpO2 98%   BMI 27.47 kg/m  Vitals and nursing notes reviewed  Physical Exam:  General: 59 y.o. female in  [place] HEENT: NCAT Cardio: RRR no m/r/g Lungs: CTAB, no wheezing, no rhonchi, no crackles, no IWOB on RA Abdomen: Soft, non-tender to palpation, non-distended, positive bowel sounds Skin: warm and dry Extremities: No edema   No results found for this or any previous visit (from the past 72 hour(s)).  Assessment/Plan:  Encounter to establish care with new doctor Patient's medical history and new patient packet reviewed.  Discussed with patient her multiple medical problems and previous history.  This is all updated in the computer.  Medications also updated in the computer.  Essential hypertension On clonidine 0.2 mg twice daily, this is a very odd choice for an initial antihypertensive medication.  On chart review, appears to be on it since 2012, cannot find a firm indication as to why this was chosen other other blood pressure medications.  Her blood pressure today is well controlled.  Plan to try some further digging in chart on this, but ultimately would like to transition patient off of this medication via taper and transition to thiazide diuretic or calcium channel blocker.  She was congratulated on her excellent exercise regimen and advised to continue doing this for her health.  Hyperlipidemia Has been on pravastatin 40 mg.  Last lipid panel in May 2019.  Will repeat today.  Patient would like to decrease this and ultimately get  off of it if at all possible.  She has a family history of significant cardiac disease in her father who died from a heart attack in his 68s and her brother who had a heart attack in his 56s.  She is however very active.  Could consider decreasing dose if lipid panel looks well.  History of substance abuse [facility]) Patient continues to go to meetings and therapy for this.  She has been clean since 2016.  She was congratulated immensely on this accomplishment and encouraged to continue with her current care.  PTSD (post-traumatic stress disorder) She discussed her  reason for having PTSD.  Continues to go to therapy.  States that she does very well with this.  Is not on any medication currently and would not like to be as long as she can help it.  Encounter for hepatitis C screening test for low risk patient Hepatitis C antibody screening for health maintenance.  Right hand pain Following with Dr. Burney Gauze, planning for surgery on 1/22 for right radial collateral ligament insufficiency.  Overweight (BMI 25.0-29.9) Works out regularly and doing well from the standpoint.  Her BMI today is 27.47, do not have any concerns regarding this.  Continue to encourage appropriate exercise regimen and healthy eating.     PATIENT EDUCATION PROVIDED: See AVS    Diagnosis and plan along with any newly prescribed medication(s) were discussed in detail with this patient today. The patient verbalized understanding and agreed with the plan. Patient advised if symptoms worsen return to clinic or ER.   Health Maintainance: Hepatitis C antibody today.  Scheduled for mammogram.  Up-to-date on colonoscopy.  Advised to come back as it is possible for Pap.   Orders Placed This Encounter  Procedures  . Lipid Panel  . Basic Metabolic Panel  . Hepatitis C antibody    Meds ordered this encounter  Medications  . pravastatin (PRAVACHOL) 40 MG tablet    Sig: Take 1 tablet (40 mg total) by mouth at bedtime.    Dispense:  90 tablet    Refill:  3  . celecoxib (CELEBREX) 100 MG capsule    Sig: Take 1 capsule (100 mg total) by mouth every other day.    Dispense:  15 capsule    Refill:  [place], DO 03/13/2019, 11:13 AM PGY-2 [place]

## 2019-03-13 NOTE — Assessment & Plan Note (Signed)
Following with Dr. Burney Gauze, planning for surgery on 1/22 for right radial collateral ligament insufficiency.

## 2019-03-13 NOTE — Assessment & Plan Note (Signed)
She discussed her reason for having PTSD.  Continues to go to therapy.  States that she does very well with this.  Is not on any medication currently and would not like to be as long as she can help it.

## 2019-03-13 NOTE — Assessment & Plan Note (Signed)
Patient's medical history and new patient packet reviewed.  Discussed with patient her multiple medical problems and previous history.  This is all updated in the computer.  Medications also updated in the computer.

## 2019-03-13 NOTE — Patient Instructions (Signed)
Thank you for coming to see me today. It was a pleasure to meet you. Today we talked about:   Your overall health and history.  Keep up the great work with exercising.  We will obtain some lab work today, I will call you if there is something we need to discuss about changing.  Please sign up for my chart, the text message should have been sent to your phone.  Make an appointment at your convenience to come back for a Pap smear.  We can also discuss possibly changing your blood pressure medication at that time.  Please follow-up with me in 1 month or sooner.  If you have any questions or concerns, please do not hesitate to call the office at [phone].  Best,   [place] Constable, DO

## 2019-03-13 NOTE — Assessment & Plan Note (Signed)
Has been on pravastatin 40 mg.  Last lipid panel in May 2019.  Will repeat today.  Patient would like to decrease this and ultimately get off of it if at all possible.  She has a family history of significant cardiac disease in her father who died from a heart attack in his 9s and her brother who had a heart attack in his 78s.  She is however very active.  Could consider decreasing dose if lipid panel looks well.

## 2019-03-14 LAB — BASIC METABOLIC PANEL
BUN/Creatinine Ratio: 15 (ref 9–23)
BUN: 16 mg/dL (ref 6–24)
CO2: 22 mmol/L (ref 20–29)
Calcium: 9.7 mg/dL (ref 8.7–10.2)
Chloride: 106 mmol/L (ref 96–106)
Creatinine, Ser: 1.05 mg/dL — ABNORMAL HIGH (ref 0.57–1.00)
GFR calc Af Amer: 69 mL/min/{1.73_m2} (ref >59)
GFR calc non Af Amer: 60 mL/min/{1.73_m2} (ref >59)
Glucose: 104 mg/dL — ABNORMAL HIGH (ref 65–99)
Potassium: 4.3 mmol/L (ref 3.5–5.2)
Sodium: 142 mmol/L (ref 134–144)

## 2019-03-14 LAB — LIPID PANEL
Chol/HDL Ratio: 3.4 ratio (ref 0.0–4.4)
Cholesterol, Total: 236 mg/dL — ABNORMAL HIGH (ref 100–199)
HDL: 69 mg/dL (ref >39)
LDL Chol Calc (NIH): 145 mg/dL — ABNORMAL HIGH (ref 0–99)
Triglycerides: 128 mg/dL (ref 0–149)
VLDL Cholesterol Cal: 22 mg/dL (ref 5–40)

## 2019-03-14 LAB — HEPATITIS C ANTIBODY: Hep C Virus Ab: <0.1 s/co ratio (ref 0.0–0.9)

## 2019-03-29 NOTE — Progress Notes (Signed)
Subjective: Chief Complaint  Patient presents with  . lab results     HPI: Tonya Mckenzie is a 59 y.o. presenting to clinic today to discuss the following:  1 Pap Smear Last Pap smear 2012 with Negative cytology, negative high risk HPV.  On chart review, patient appears to have had a hysterectomy in 2010? for fibroids.  Cervix was removed.  No concerns, no vaginal bleeding.   2 HTN Patient has been on clonidine 0.2mg  BID since 2012, per her report this was the first and only BP medication she has ever been on.   - Checking BP at home: no - Denies any SOB, CP, vision changes, LE edema, medication SEs, or symptoms of hypotension - Diet: doing well  - Exercise: regularly   3 HLD Has been on pravastatin 40mg  and tolerating well.  Family history of father with MI in 77s. Lipid Panel 03/13/2019    Component Value Date/Time   CHOL 236 (H) 03/13/2019 1108   TRIG 128 03/13/2019 1108   HDL 69 03/13/2019 1108   CHOLHDL 3.4 03/13/2019 1108   CHOLHDL 4.0 07/11/2017 0630   VLDL 27 07/11/2017 0630   LDLCALC 145 (H) 03/13/2019 1108   LABVLDL 22 03/13/2019 1108   4 Right Hand Pain  S/P right thumb MCP joint arthrodesis, was given a prescription of Percocet 20 by her surgeon, but she is asking for prescription for ibuprofen, as she does not want to take this medication because she knows it can be addictive.     ROS noted in HPI. Chief complaint noted.  Other Pertinent PMH: HTN, HLD Past Medical, Surgical, Social, and Family History Reviewed & Updated per EMR.      Social History   Tobacco Use  Smoking Status Former Smoker  . Types: Cigarettes  Smokeless Tobacco Never Used  Tobacco Comment   never really smoked, when she did was about 1 cigarette a month   Smoking status noted.    Objective: BP 122/74   Pulse 67   Wt 170 lb (77.1 kg)   SpO2 99%   BMI 27.44 kg/m  Vitals and nursing notes reviewed  Physical Exam:  General: 59 y.o. female in NAD Cardio: RRR no  m/r/g Lungs: CTAB, no wheezing, no rhonchi, no crackles, no IWOB on RA Extremities: Right hand in brace, No edema   No results found for this or any previous visit (from the past 72 hour(s)).  Assessment/Plan:  Essential hypertension Discussed previously that given clonidine was patient's initial antihypertensive medication, would advise discontinuing and starting something to keep it more of a steady state that does not have a risk of rebound, like amlodipine.  Patient agrees with this.  Will taper off, 0.1 mg twice daily x1 week, then 0.1 mg daily x1 week.  Then discontinue.  When she decreases clonidine to 0.1 mg daily, can start Norvasc 5 mg daily.  She should come back in 4 to 6 weeks to have blood pressure check and BMP.  Hyperlipidemia Patient reports an excellent diet and exercise regimen.  She has been on pravastatin 40 mg.  Her LDL was 145 and total cholesterol was 236, both very high.  Will increase pravastatin to 80 mg.  Recheck LDL in 4 to 6 weeks.  Can consider addition of Zetia if still not at goal.  Given her significant family history of heart disease and the fact that she states that she is overall very healthy, there could be a factor of familial hypercholesterolemia  or lipoprotein a.  Right hand pain Postsurgical pain.  Rx sent for ibuprofen 600 mg every 8 hours as needed.  Patient advised of 600 mg is not enough, can add one over-the-counter 200 mg tablet with this to have a total of 800 mg.  Advised to never use more than 800 mg of ibuprofen at a time.  Advised to use Tylenol prn if has breakthrough pain.  Pap smear of cervix not needed Patient has had a hysterectomy, no cervix.  Last Pap in 2012 with normal cytology and negative HPV.  Hysterectomy was not performed for cancerous reason, therefore patient does not need a Pap.     PATIENT EDUCATION PROVIDED: See AVS    Diagnosis and plan along with any newly prescribed medication(s) were discussed in detail with this  patient today. The patient verbalized understanding and agreed with the plan. Patient advised if symptoms worsen return to clinic or ER.   Health Maintainance: needs Tdap   No orders of the defined types were placed in this encounter.   Meds ordered this encounter  Medications  . pravastatin (PRAVACHOL) 80 MG tablet    Sig: Take 1 tablet (80 mg total) by mouth at bedtime.    Dispense:  90 tablet    Refill:  0  . cloNIDine (CATAPRES) 0.1 MG tablet    Sig: Take 1 tablet (0.1 mg total) by mouth 2 (two) times daily. Take twice a day for 1 week, then once a day (at night) for one week, then discontinue.    Dispense:  21 tablet    Refill:  0  . amLODipine (NORVASC) 5 MG tablet    Sig: Take 1 tablet (5 mg total) by mouth at bedtime. Start taking when you decrease your clonidine to once a day.    Dispense:  90 tablet    Refill:  3  . ibuprofen (ADVIL) 600 MG tablet    Sig: Take 1 tablet (600 mg total) by mouth every 8 (eight) hours as needed for mild pain or moderate pain.    Dispense:  30 tablet    Refill:  [place], DO 03/30/2019, 3:03 PM PGY-2 [place]

## 2019-03-30 ENCOUNTER — Other Ambulatory Visit: Payer: Self-pay

## 2019-03-30 ENCOUNTER — Encounter: Payer: Self-pay | Admitting: Family Medicine

## 2019-03-30 ENCOUNTER — Ambulatory Visit (INDEPENDENT_AMBULATORY_CARE_PROVIDER_SITE_OTHER): Payer: Medicaid Other | Admitting: Family Medicine

## 2019-03-30 VITALS — BP 122/74 | HR 67 | Wt 170.0 lb

## 2019-03-30 DIAGNOSIS — M79641 Pain in right hand: Secondary | ICD-10-CM

## 2019-03-30 DIAGNOSIS — Z538 Procedure and treatment not carried out for other reasons: Secondary | ICD-10-CM | POA: Diagnosis not present

## 2019-03-30 DIAGNOSIS — I10 Essential (primary) hypertension: Secondary | ICD-10-CM

## 2019-03-30 DIAGNOSIS — E785 Hyperlipidemia, unspecified: Secondary | ICD-10-CM

## 2019-03-30 MED ORDER — AMLODIPINE BESYLATE 5 MG PO TABS: 5.0000 mg | ORAL_TABLET | Freq: Every day | ORAL | 3 refills | Status: DC

## 2019-03-30 MED ORDER — PRAVASTATIN SODIUM 80 MG PO TABS: 80.0000 mg | ORAL_TABLET | Freq: Every day | ORAL | 0 refills | Status: DC

## 2019-03-30 MED ORDER — CLONIDINE HCL 0.1 MG PO TABS: 0.1000 mg | ORAL_TABLET | Freq: Two times a day (BID) | ORAL | 0 refills | Status: DC

## 2019-03-30 MED ORDER — IBUPROFEN 600 MG PO TABS: 600.0000 mg | ORAL_TABLET | Freq: Three times a day (TID) | ORAL | 0 refills | Status: DC | PRN

## 2019-03-30 NOTE — Telephone Encounter (Signed)
Pt calls nurse line regarding refill for pravastatin. Patient states that other medications were at pharmacy but they did not receive this one. Medication order type was phone in, updated to normal and resent to pharmacy.   Talbot Grumbling, RN

## 2019-03-30 NOTE — Assessment & Plan Note (Signed)
Postsurgical pain.  Rx sent for ibuprofen 600 mg every 8 hours as needed.  Patient advised of 600 mg is not enough, can add one over-the-counter 200 mg tablet with this to have a total of 800 mg.  Advised to never use more than 800 mg of ibuprofen at a time.  Advised to use Tylenol prn if has breakthrough pain.

## 2019-03-30 NOTE — Assessment & Plan Note (Signed)
Patient reports an excellent diet and exercise regimen.  She has been on pravastatin 40 mg.  Her LDL was 145 and total cholesterol was 236, both very high.  Will increase pravastatin to 80 mg.  Recheck LDL in 4 to 6 weeks.  Can consider addition of Zetia if still not at goal.  Given her significant family history of heart disease and the fact that she states that she is overall very healthy, there could be a factor of familial hypercholesterolemia or lipoprotein a.

## 2019-03-30 NOTE — Assessment & Plan Note (Signed)
Discussed previously that given clonidine was patient's initial antihypertensive medication, would advise discontinuing and starting something to keep it more of a steady state that does not have a risk of rebound, like amlodipine.  Patient agrees with this.  Will taper off, 0.1 mg twice daily x1 week, then 0.1 mg daily x1 week.  Then discontinue.  When she decreases clonidine to 0.1 mg daily, can start Norvasc 5 mg daily.  She should come back in 4 to 6 weeks to have blood pressure check and BMP.

## 2019-03-30 NOTE — Patient Instructions (Signed)
Thank you for coming to see me today. It was a pleasure. Today we talked about:   Your blood pressure: I have sent a prescription for clonidine 0.1mg .  Take this twice a day for 1 week, then once a day (at night) for one week, then stop.  When you start taking the clonidine once daily, start taking your new blood pressure medicine as well.  We have increased your cholesterol medicine.    We will recheck your labs in 4-6 weeks.  Please follow-up with me in 4-6 weeks.  If you have any questions or concerns, please do not hesitate to call the office at [phone].  Best,   Arizona Constable, DO

## 2019-03-30 NOTE — Assessment & Plan Note (Signed)
Patient has had a hysterectomy, no cervix.  Last Pap in 2012 with normal cytology and negative HPV.  Hysterectomy was not performed for cancerous reason, therefore patient does not need a Pap.

## 2019-04-13 ENCOUNTER — Ambulatory Visit
Admission: RE | Admit: 2019-04-13 | Discharge: 2019-04-13 | Disposition: A | Discharge status: Home / Self Care | Payer: Medicaid Other | Source: Ambulatory Visit | Attending: Internal Medicine | Admitting: Internal Medicine

## 2019-04-13 ENCOUNTER — Other Ambulatory Visit: Payer: Self-pay

## 2019-04-13 DIAGNOSIS — Z1231 Encounter for screening mammogram for malignant neoplasm of breast: Secondary | ICD-10-CM

## 2019-04-18 ENCOUNTER — Telehealth: Payer: Self-pay

## 2019-04-18 NOTE — Telephone Encounter (Signed)
Patient calls nurse line stating her blood pressure has been running higher than she would like to see. Patient stated her BP has gone from low 100s/65s to 150s/80s since starting amlodipine. Patient checked her BP for me while on the phone, 154/108. Patient stated she took amlodipine at 8pm last night and clonidine at 6am this morning. Patient would like to change amlodipine or up dosage. She does have an apt 3/2 with PCP. Patient at this time denies any worrisome sxs, ED precautions given. Please advise.

## 2019-04-20 NOTE — Telephone Encounter (Signed)
Called patient.  She reports that she was using her blood pressure cuff the wrong way.  When she started using it correctly, she noted that her blood pressures have been normal, highest [id number] systolic.  Reports that she has finished her clonidine taper last week, is currently taking amlodipine 5 mg.  States that she occasionally has symptoms of feeling dizzy around 6 or 7 PM, but the feeling passes quickly.  Advised her when this happens that she should sit down and check her blood pressure at that time.  It could be that her blood pressure is getting slightly too low.  Advised her to remain well-hydrated.  Advised that if she has worsening of symptoms, chest pain, difficulty breathing, she should be seen right away the emergency room.  Also advised that she could move her appointment up if should this continue to happen, she is currently scheduled on 3/2.  She voiced understanding.  Also endorsed occasional palpitations since decreasing clonidine.  States that is only happened a few times and only lasted about a second or 2.  Did not have any shortness of breath or chest pain with this.  Advised that this could be secondary to coming off of clonidine given that she had been on it for a long period of time and it could have been helping with underlying anxiety.  Could consider restarting her on an anxiety medication.  Advised her that this happens again, she can move up her appointment.  Also advised that if this occurs, she experiences chest pain or shortness of breath, however this last for a longer amount of time, she should be seen right away.  She voiced understanding.

## 2019-05-01 ENCOUNTER — Ambulatory Visit: Payer: Medicaid Other | Admitting: Family Medicine

## 2019-05-15 ENCOUNTER — Ambulatory Visit (INDEPENDENT_AMBULATORY_CARE_PROVIDER_SITE_OTHER): Payer: Medicaid Other | Admitting: Family Medicine

## 2019-05-15 ENCOUNTER — Other Ambulatory Visit: Payer: Self-pay

## 2019-05-15 VITALS — BP 124/70 | HR 70 | Wt 174.4 lb

## 2019-05-15 DIAGNOSIS — I10 Essential (primary) hypertension: Secondary | ICD-10-CM | POA: Diagnosis present

## 2019-05-15 DIAGNOSIS — R1012 Left upper quadrant pain: Secondary | ICD-10-CM

## 2019-05-15 DIAGNOSIS — E785 Hyperlipidemia, unspecified: Secondary | ICD-10-CM

## 2019-05-15 DIAGNOSIS — R1013 Epigastric pain: Secondary | ICD-10-CM | POA: Insufficient documentation

## 2019-05-15 NOTE — Progress Notes (Signed)
    SUBJECTIVE:   CHIEF COMPLAINT / HPI:   HTN/FU Patient was weaned off of clonidine for blood pressure on 1/29.  She is currently on Norvasc 5 mg daily.  Has been compliant with this.  Continues to exercise.    She had called last month to discuss possible elevated blood pressures, then noted she was using her blood pressure cuff the wrong.  At that time she endorsed occasional dizziness in the evening.  States that very rarely she feels dizzy upon standing.    Denies chest pain and shortness of breath.  Reports that mood has been doing well since clonidine has been discontinued.  GAD-7 today 0.  LUQ Abdominal Pain Comes and goes 3/10 at the worst Thinks that the pain is worse with drinking wine, but denies worsening with eating When washing dishes and standing there a long time, she feels the pain more Has a history of a bruised spleen 6 years ago and is worried that this might be causing her pain States that she has been seen by a stomach doctor in the past and had a scope, but was told that there was nothing there States that it does not bother her very much, but just wants to make sure that it is nothing bad.  HLD Patient's pravastatin increased to 80 mg after lipid panel on 1/21 with LDL 145.  Continues to tolerate this well.    PERTINENT  PMH / PSH: HTN, HLD  OBJECTIVE:   BP 124/70   Pulse 70   Wt 174 lb 6.4 oz (79.1 kg)   SpO2 97%   BMI 28.15 kg/m    Physical Exam:  General: 59 y.o. female in NAD Cardio: RRR no m/r/g Lungs: CTAB, no wheezing, no rhonchi, no crackles, no IWOB on RA Abdomen: Soft, non-tender to palpation, non-distended, did have pain with palpation of LUQ while flexing abdominal muscles Skin: warm and dry Extremities: No edema  Orthostatic VS for the past 24 hrs:  BP- Lying Pulse- Lying BP- Sitting Pulse- Sitting BP- Standing at 0 minutes Pulse- Standing at 0 minutes  05/15/19 1634 126/83 69 125/84 73 128/84 87      ASSESSMENT/PLAN:    Essential hypertension Well controlled.  Doing well off clonidine.  No evidence of anxiety off clonidine.  Will continue norvasc 5mg .  Hyperlipidemia Not at goal.  Currently on pravastatin 80 mg.  Will check direct LDL today.  If not at goal, less than 100, will likely start Zetia.  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  Abdominal wall pain in left upper quadrant On chart review, it appears that patient has had a significant work-up for this abdominal pain.  Last seen by GI in June.  She has had normal upper endoscopy in February 2018, normal colonoscopy in March 2016.  Her pain was thought to be consistent with musculoskeletal abdominal wall pain.  Exam today is also consistent with this.  Discussed with the patient importance of strengthening her abdominal muscles.  Advised her that she can go to PT Pilates here in [place] or she can do exercise on her own to focus on her strength.  Advised her that a prior bruise on the spleen is not related to this pain.     Cleophas Dunker, [place]

## 2019-05-15 NOTE — Assessment & Plan Note (Signed)
Not at goal.  Currently on pravastatin 80 mg.  Will check direct LDL today.  If not at goal, less than 100, will likely start Zetia.  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

## 2019-05-15 NOTE — Assessment & Plan Note (Signed)
On chart review, it appears that patient has had a significant work-up for this abdominal pain.  Last seen by GI in June.  She has had normal upper endoscopy in February 2018, normal colonoscopy in March 2016.  Her pain was thought to be consistent with musculoskeletal abdominal wall pain.  Exam today is also consistent with this.  Discussed with the patient importance of strengthening her abdominal muscles.  Advised her that she can go to PT Pilates here in [place] or she can do exercise on her own to focus on her strength.  Advised her that a prior bruise on the spleen is not related to this pain.

## 2019-05-15 NOTE — Assessment & Plan Note (Signed)
Well controlled.  Doing well off clonidine.  No evidence of anxiety off clonidine.  Will continue norvasc 5mg .

## 2019-05-15 NOTE — Patient Instructions (Addendum)
Thank you for coming to see me today. It was a pleasure. Today we talked about:   We will check your electrolytes, kidney function, and cholesterol today.  I will let you know the results.  If I need to make changes we will call you.  Your blood pressure looks great today, please continue to stay well-hydrated.  If dizziness worsens, please let me know.  Your abdominal pain is coming from your abdominal wall. Work on strengthening this by targeting your abdominal muscles and doing things like Pilates.  You can look into PT Pilates that is with trained physical therapists here in [place].  Their website as below.  [email or web address]  Please follow-up with me in 2 months or sooner as needed.  If you have any questions or concerns, please do not hesitate to call the office at [phone].  Best,   Arizona Constable, DO    [email or web address]

## 2019-05-16 ENCOUNTER — Other Ambulatory Visit: Payer: Self-pay | Admitting: Family Medicine

## 2019-05-16 DIAGNOSIS — E785 Hyperlipidemia, unspecified: Secondary | ICD-10-CM

## 2019-05-16 LAB — BASIC METABOLIC PANEL
BUN/Creatinine Ratio: 16 (ref 9–23)
BUN: 15 mg/dL (ref 6–24)
CO2: 23 mmol/L (ref 20–29)
Calcium: 8.9 mg/dL (ref 8.7–10.2)
Chloride: 112 mmol/L — ABNORMAL HIGH (ref 96–106)
Creatinine, Ser: 0.91 mg/dL (ref 0.57–1.00)
GFR calc Af Amer: 81 mL/min/{1.73_m2} (ref >59)
GFR calc non Af Amer: 70 mL/min/{1.73_m2} (ref >59)
Glucose: 105 mg/dL — ABNORMAL HIGH (ref 65–99)
Potassium: 4 mmol/L (ref 3.5–5.2)
Sodium: 145 mmol/L — ABNORMAL HIGH (ref 134–144)

## 2019-05-16 LAB — LDL CHOLESTEROL, DIRECT: LDL Direct: 114 mg/dL — ABNORMAL HIGH (ref 0–99)

## 2019-05-16 MED ORDER — EZETIMIBE 10 MG PO TABS: 10.0000 mg | ORAL_TABLET | Freq: Every day | ORAL | 3 refills | Status: DC

## 2019-05-16 NOTE — Progress Notes (Signed)
Patient with significant family history of CAD, would like LDL <100, therefore will add zetia.

## 2019-06-13 ENCOUNTER — Other Ambulatory Visit: Payer: Self-pay

## 2019-06-13 DIAGNOSIS — E785 Hyperlipidemia, unspecified: Secondary | ICD-10-CM

## 2019-06-13 MED ORDER — PRAVASTATIN SODIUM 80 MG PO TABS: 80.0000 mg | ORAL_TABLET | Freq: Every day | ORAL | 0 refills | Status: DC

## 2019-06-19 ENCOUNTER — Encounter: Payer: Self-pay | Admitting: Family Medicine

## 2019-06-19 ENCOUNTER — Ambulatory Visit (INDEPENDENT_AMBULATORY_CARE_PROVIDER_SITE_OTHER): Payer: Medicaid Other | Admitting: Family Medicine

## 2019-06-19 ENCOUNTER — Other Ambulatory Visit: Payer: Self-pay

## 2019-06-19 VITALS — BP 108/66 | HR 70 | Ht 66.0 in | Wt 171.8 lb

## 2019-06-19 DIAGNOSIS — E785 Hyperlipidemia, unspecified: Secondary | ICD-10-CM | POA: Diagnosis not present

## 2019-06-19 DIAGNOSIS — E559 Vitamin D deficiency, unspecified: Secondary | ICD-10-CM | POA: Diagnosis not present

## 2019-06-19 NOTE — Patient Instructions (Signed)
Thank you for coming to see me today. It was a pleasure. Today we talked about:   We will check your cholesterol and Vitamin D levels today and I will call you tomorrow regarding the plan from here.  Please follow-up with me in 3 months (we can decide better tomorrow when your labs are back).  If you have any questions or concerns, please do not hesitate to call the office at [phone].  Best,   Arizona Constable, DO

## 2019-06-19 NOTE — Assessment & Plan Note (Signed)
Patient reports a history of vitamin D deficiency and has been on 50,000 units weekly for over 6 months, at least since she had her prior PCP.  We will recheck vitamin D levels today and see if patient needs to continue with the supplementation.

## 2019-06-19 NOTE — Assessment & Plan Note (Signed)
We will recheck direct LDL today to see if any improvement with the addition of Zetia.  If no improvement, could consider changing to atorvastatin to see if patient has better results with this.  Suspect that Tonya Mckenzie does have an underlying familial hyperlipidemia given her overall health and as well as her significant family history of a father with a heart attack in his 26s.  Could consider referral to lipid clinic in the future if LDL remains unimproved.

## 2019-06-19 NOTE — Progress Notes (Signed)
    SUBJECTIVE:   CHIEF COMPLAINT / HPI:   HLD Patient's last LDL 114, Zetia added to pravastatin 80 mg.  Patient has a significant family history of father with MI in 44s.  Tolerating medications well.  No chest pain, shortness of breath.  Occasionally has leg soreness, but thinks it is from exercising.  Vitamin D Deficiency Has been on 50,000 units weekly since before she was seen here.  Is wondering her long she needs to take it.  PERTINENT  PMH / PSH: Hypertension, PTSD, prior history of substance abuse  OBJECTIVE:   BP 108/66   Pulse 70   Ht 5\' 6"  (1.676 m)   Wt 171 lb 12.8 oz (77.9 kg)   SpO2 99%   BMI 27.73 kg/m    Physical Exam:  General: 59 y.o. female in NAD Cardio: RRR no m/r/g Lungs: CTAB, no wheezing, no rhonchi, no crackles, no IWOB on RA Skin: warm and dry Extremities: No edema   ASSESSMENT/PLAN:   Hyperlipidemia We will recheck direct LDL today to see if any improvement with the addition of Zetia.  If no improvement, could consider changing to atorvastatin to see if patient has better results with this.  Suspect that she does have an underlying familial hyperlipidemia given her overall health and as well as her significant family history of a father with a heart attack in his 70s.  Could consider referral to lipid clinic in the future if LDL remains unimproved.  Vitamin D deficiency Patient reports a history of vitamin D deficiency and has been on 50,000 units weekly for over 6 months, at least since she had her prior PCP.  We will recheck vitamin D levels today and see if patient needs to continue with the supplementation.     Cleophas Dunker, [place]

## 2019-06-20 LAB — VITAMIN D 25 HYDROXY (VIT D DEFICIENCY, FRACTURES): Vit D, 25-Hydroxy: 39.3 ng/mL (ref 30.0–100.0)

## 2019-06-20 LAB — LDL CHOLESTEROL, DIRECT: LDL Direct: 115 mg/dL — ABNORMAL HIGH (ref 0–99)

## 2019-06-21 ENCOUNTER — Other Ambulatory Visit: Payer: Self-pay | Admitting: Family Medicine

## 2019-06-21 DIAGNOSIS — E785 Hyperlipidemia, unspecified: Secondary | ICD-10-CM

## 2019-06-21 MED ORDER — ROSUVASTATIN CALCIUM 40 MG PO TABS: 40.0000 mg | ORAL_TABLET | Freq: Every day | ORAL | 1 refills | Status: DC

## 2019-06-21 MED ORDER — EZETIMIBE 10 MG PO TABS: 10.0000 mg | ORAL_TABLET | Freq: Every day | ORAL | 3 refills | Status: DC

## 2019-06-21 NOTE — Progress Notes (Signed)
error 

## 2019-08-02 ENCOUNTER — Encounter: Payer: Self-pay | Admitting: Family Medicine

## 2019-08-02 ENCOUNTER — Ambulatory Visit (INDEPENDENT_AMBULATORY_CARE_PROVIDER_SITE_OTHER): Payer: Medicaid Other | Admitting: Family Medicine

## 2019-08-02 ENCOUNTER — Other Ambulatory Visit: Payer: Self-pay

## 2019-08-02 VITALS — BP 98/64 | HR 60 | Ht 66.0 in | Wt 170.4 lb

## 2019-08-02 DIAGNOSIS — E785 Hyperlipidemia, unspecified: Secondary | ICD-10-CM

## 2019-08-02 DIAGNOSIS — E559 Vitamin D deficiency, unspecified: Secondary | ICD-10-CM

## 2019-08-02 DIAGNOSIS — I10 Essential (primary) hypertension: Secondary | ICD-10-CM | POA: Diagnosis not present

## 2019-08-02 MED ORDER — ROSUVASTATIN CALCIUM 40 MG PO TABS: 40.0000 mg | ORAL_TABLET | Freq: Every day | ORAL | 1 refills | Status: DC

## 2019-08-02 MED ORDER — EZETIMIBE 10 MG PO TABS: 10.0000 mg | ORAL_TABLET | Freq: Every day | ORAL | 3 refills | Status: DC

## 2019-08-02 NOTE — Assessment & Plan Note (Signed)
BP well controlled today.  Reports that it is well controlled at home.  Currently tolerating Norvasc 5 mg daily.  We will continue with this for now.  She is not having any symptoms of hypotension.  Could consider decreasing versus discontinuing in the future if needed.

## 2019-08-02 NOTE — Patient Instructions (Signed)
Thank you for coming to see me today. It was a pleasure. Today we talked about:   We will check your cholesterol levels today.  I'll call you next week with the results.  Keep taking all of your medications.  Keep up with the great diet and exercise!  Please follow-up with me in 2 months.  If you have any questions or concerns, please do not hesitate to call the office at [phone].  Best,   Arizona Constable, DO

## 2019-08-02 NOTE — Assessment & Plan Note (Signed)
Recently changed from pravastatin to Crestor 40 mg.  Also on Zetia.  Compliant with both of these.  Will check LDL today.  Discussed previous goal of LDL less than 100.  I think if patient is close to this, can likely continue.  She is, borderline needing LDL goal of 100-130.

## 2019-08-02 NOTE — Assessment & Plan Note (Signed)
Can continue with current regimen.  Could also decreased to over-the-counter vitamin D daily.  For now, patient plans to continue with 50,000 units weekly.

## 2019-08-02 NOTE — Progress Notes (Signed)
    SUBJECTIVE:   CHIEF COMPLAINT / HPI:   HLD Taking crestor and zetia Tolerated well No CP, SOB, leg edema Had been working for goal of LDL <100 given her significant family history of father with MI in 54s  Vit D deficiency Taking 60 qwk, reports compliance Last checked on 4/20 and was in low normal range at 39.3  HTN Checks BP at home: the highest she has seen was 110 SBP No problems with hypotension Denies chest pain, shortness of breath, leg edema  PERTINENT  PMH / PSH: Hypertension, significant family history of father with MI in 55s and brother with MI in 40s, vitamin D deficiency  OBJECTIVE:   BP 98/64 Comment: provider informed  Pulse 60   Ht 5\' 6"  (1.676 m)   Wt 170 lb 6 oz (77.3 kg)   SpO2 99%   BMI 27.50 kg/m    Physical Exam:  General: 59 y.o. female in NAD Cardio: RRR no m/r/g Lungs: CTAB, no wheezing, no rhonchi, no crackles, no IWOB on RA Skin: warm and dry Extremities: No edema   ASSESSMENT/PLAN:   Hyperlipidemia Recently changed from pravastatin to Crestor 40 mg.  Also on Zetia.  Compliant with both of these.  Will check LDL today.  Discussed previous goal of LDL less than 100.  I think if patient is close to this, can likely continue.  She is, borderline needing LDL goal of 100-130.  Vitamin D deficiency Can continue with current regimen.  Could also decreased to over-the-counter vitamin D daily.  For now, patient plans to continue with 50,000 units weekly.  Essential hypertension BP well controlled today.  Reports that it is well controlled at home.  Currently tolerating Norvasc 5 mg daily.  We will continue with this for now.  She is not having any symptoms of hypotension.  Could consider decreasing versus discontinuing in the future if needed.     Cleophas Dunker, [place]

## 2019-08-03 ENCOUNTER — Encounter: Payer: Self-pay | Admitting: Family Medicine

## 2019-08-03 LAB — LDL CHOLESTEROL, DIRECT: LDL Direct: 71 mg/dL (ref 0–99)

## 2019-08-18 ENCOUNTER — Other Ambulatory Visit: Payer: Self-pay

## 2019-08-18 ENCOUNTER — Ambulatory Visit (HOSPITAL_COMMUNITY)
Admission: EM | Admit: 2019-08-18 | Discharge: 2019-08-18 | Disposition: A | Discharge status: Home / Self Care | Payer: Medicaid Other

## 2019-08-21 ENCOUNTER — Other Ambulatory Visit: Payer: Self-pay

## 2019-08-21 ENCOUNTER — Ambulatory Visit (HOSPITAL_COMMUNITY)
Admission: EM | Admit: 2019-08-21 | Discharge: 2019-08-21 | Disposition: A | Discharge status: Home / Self Care | Payer: Medicaid Other | Attending: Physician Assistant | Admitting: Physician Assistant

## 2019-08-21 ENCOUNTER — Encounter (HOSPITAL_COMMUNITY): Payer: Self-pay

## 2019-08-21 DIAGNOSIS — S46912A Strain of unspecified muscle, fascia and tendon at shoulder and upper arm level, left arm, initial encounter: Secondary | ICD-10-CM

## 2019-08-21 DIAGNOSIS — M25512 Pain in left shoulder: Secondary | ICD-10-CM

## 2019-08-21 MED ORDER — PREDNISONE 10 MG PO TABS: 40.0000 mg | ORAL_TABLET | Freq: Every day | ORAL | 0 refills | Status: AC

## 2019-08-21 MED ORDER — ACETAMINOPHEN 500 MG PO TABS: 1000.0000 mg | ORAL_TABLET | Freq: Three times a day (TID) | ORAL | 0 refills | Status: DC | PRN

## 2019-08-21 NOTE — ED Triage Notes (Signed)
Pt presents today with left shoulder pain that began 2 weeks ago while working out. Pt states she was pulling down with weights and felt pain. Pt states she believes she pulled or strained a muscle but the pain has not subsided. Pt states the pain is worse with movement. Pt has full ROM in left shoulder. Pt has tried heat, ice, tylenol and ibuprofen without symptom improvement.

## 2019-08-21 NOTE — ED Provider Notes (Signed)
[facility]    CSN: [id number] Arrival date & time: 08/21/19  1583      History   Chief Complaint Chief Complaint  Patient presents with  . Shoulder Pain    HPI Tonya Mckenzie is a 59 y.o. female.   Patient presents for evaluation of 2-week history of left shoulder pain.  She reports pain started after working out and doing a new exercise around 2 weeks ago.  She reports she was doing exercise for she pulls weight down.  She reports feeling pain in the back of the shoulder later that evening.  Reports pain has been persistent since then.  She reports pain is worse at night and that she props the shoulder with pillows.  She describes an achy pain.  She reports that the pain is not all that significant if resting and not moving.  She denies any weakness, tingling or numbness in the left arm.  No falls or trauma.  Pain does not radiate into the neck or chest.  No shortness of breath.  No chest pain.  No previous surgeries on the shoulder.  She has not noticed any swelling.  Patient states she has been taking ibuprofen and Tylenol for the last 2 weeks without much improvement.  She denies the pain is worse but it is not improved.  Is primarily most bothersome at night.     Past Medical History:  Diagnosis Date  . Allergic rhinitis   . Anxiety   . Bacterial vaginosis   . Chronic post-traumatic stress disorder (PTSD)   . Cocaine abuse ([place])   . Depression   . ETOH abuse   . GERD (gastroesophageal reflux disease)   . Hyperlipidemia   . Hypertension    off meds due to weight loss  . Marijuana abuse   . Uterine leiomyoma     Patient Active Problem List   Diagnosis Date Noted  . Vitamin D deficiency 06/19/2019  . Abdominal wall pain in left upper quadrant 05/15/2019  . Pap smear of cervix not needed 03/30/2019  . Encounter to establish care with new doctor 03/13/2019  . Hyperlipidemia 03/13/2019  . Essential hypertension 03/13/2019  . Encounter for hepatitis C  screening test for low risk patient 03/13/2019  . Right hand pain 03/13/2019  . PTSD (post-traumatic stress disorder) 03/13/2019  . History of substance abuse ([place]) 03/13/2019  . Overweight (BMI 25.0-29.9) 03/13/2019  . 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 MDD (major depressive disorder), recurrent severe, without psychosis ([place]) 07/09/2017    Past Surgical History:  Procedure Laterality Date  . ABDOMINAL HYSTERECTOMY     left ovaries, took cervix.  Performed for heavy periods and anemia  . CHOLECYSTECTOMY    . TOE SURGERY Left   . TONSILLECTOMY    . TYMPANOSTOMY TUBE PLACEMENT      OB History    Gravida  4   Para  4   Term      Preterm      AB      Living        SAB      TAB      Ectopic      Multiple      Live Births               Home Medications    Prior to Admission medications   Medication Sig Start Date End Date Taking? Authorizing Provider  amLODipine (NORVASC) 5 MG tablet Take 1 tablet (  5 mg total) by mouth at bedtime. Start taking when you decrease your clonidine to once a day. 03/30/19  Yes Meccariello, Bernita Raisin, DO  ezetimibe (ZETIA) 10 MG tablet Take 1 tablet (10 mg total) by mouth daily. 08/02/19  Yes Meccariello, Bernita Raisin, DO  rosuvastatin (CRESTOR) 40 MG tablet Take 1 tablet (40 mg total) by mouth daily. 08/02/19  Yes Meccariello, Bernita Raisin, DO  Vitamin D, Ergocalciferol, (DRISDOL) 1.25 MG (50000 UT) CAPS capsule TK 1 C PO ONCE A WEEK 08/25/18  Yes [provider]  acetaminophen (TYLENOL) 500 MG tablet Take 2 tablets (1,000 mg total) by mouth every 8 (eight) hours as needed for moderate pain. 08/21/19   Haakon Titsworth, Marguerita Beards, PA-C  ibuprofen (ADVIL) 600 MG tablet Take 1 tablet (600 mg total) by mouth every 8 (eight) hours as needed for mild pain or moderate pain. 03/30/19   Meccariello, Bernita Raisin, DO  predniSONE (DELTASONE) 10 MG tablet Take 4 tablets (40 mg total) by mouth daily for 5 days. 08/21/19 08/26/19  Psalms Olarte,  Marguerita Beards, PA-C    Family History Family History  Problem Relation Age of Onset  . Diabetes Mother   . Hypertension Mother   . Hypercholesterolemia Mother   . Heart attack Father   . Heart attack Brother   . Colon cancer Neg Hx   . Esophageal cancer Neg Hx   . Rectal cancer Neg Hx   . Stomach cancer Neg Hx   . Liver cancer Neg Hx   . Breast cancer Neg Hx     Social History Social History   Tobacco Use  . Smoking status: Former Smoker    Types: Cigarettes  . Smokeless tobacco: Never Used  . Tobacco comment: never really smoked, when she did was about 1 cigarette a month  Vaping Use  . Vaping Use: Never used  Substance Use Topics  . Alcohol use: Not Currently    Comment: quit drinking 2016  . Drug use: Not Currently    Types: Cocaine, Marijuana    Comment: reformed went through rehab     Allergies   Patient has no known allergies.   Review of Systems Review of Systems   Physical Exam Triage Vital Signs ED Triage Vitals  Enc Vitals Group     BP 08/21/19 0949 134/72     Pulse Rate 08/21/19 0949 63     Resp 08/21/19 0949 18     Temp 08/21/19 0949 98.4 F (36.9 C)     Temp Source 08/21/19 0949 Oral     SpO2 08/21/19 0949 100 %     Weight --      Height --      Head Circumference --      Peak Flow --      Pain Score 08/21/19 0950 10     Pain Loc --      Pain Edu? --      Excl. in [place]? --    No data found.  Updated Vital Signs BP 134/72 (BP Location: Right Arm)   Pulse 63   Temp 98.4 F (36.9 C) (Oral)   Resp 18   SpO2 100%   Visual Acuity Right Eye Distance:   Left Eye Distance:   Bilateral Distance:    Right Eye Near:   Left Eye Near:    Bilateral Near:     Physical Exam Vitals and nursing note reviewed.  Constitutional:      General: She is not in acute distress.  Appearance: She is well-developed.  HENT:     Head: Normocephalic and atraumatic.  Eyes:     Conjunctiva/sclera: Conjunctivae normal.  Cardiovascular:     Rate and  Rhythm: Normal rate and regular rhythm.     Heart sounds: No murmur heard.   Pulmonary:     Effort: Pulmonary effort is normal. No respiratory distress.     Breath sounds: Normal breath sounds.  Abdominal:     Palpations: Abdomen is soft.     Tenderness: There is no abdominal tenderness.  Musculoskeletal:     Right shoulder: Normal.     Left shoulder: Tenderness (There is tenderness and soft tissue to the posterior aspect of the shoulder as well as, superior and anterior aspects.) present. No swelling, bony tenderness or crepitus. Normal range of motion. Normal strength. Normal pulse.     Cervical back: Neck supple.     Comments: Left arm : patient has full range of motion however reports elicited pain with terminal abduction, flexion.  Patient able to place arm behind back and left hand from back without issue.  Some reported pain with resisted external rotation.  There is some pain elicited with empty can test, but strength good.  Patient able to slowly lower arm from full abduction.  Speeds test negative.  No tenderness to palpation over the bicipital groove with internal and external rotation.  Strength is 5/5 at the shoulder, elbow and wrist.  Distal pulses intact.  Sensation grossly intact.  Skin:    General: Skin is warm and dry.  Neurological:     Mental Status: She is alert.      UC Treatments / Results  Labs (all labs ordered are listed, but only abnormal results are displayed) Labs Reviewed - No data to display  EKG   Radiology No results found.  Procedures Procedures (including critical care time)  Medications Ordered in UC Medications - No data to display  Initial Impression / Assessment and Plan / UC Course  I have reviewed the triage vital signs and the nursing notes.  Pertinent labs & imaging results that were available during my care of the patient were reviewed by me and considered in my medical decision making (see chart for details).    #Shoulder  strain #Shoulder pain Patient is a 59 year old presenting with shoulder strain.  Differential would include rotator cuff, capsular, latissimus dorsi tendinopathy versus other shoulder arthropathy.  Very reproducible on exam, no chest pain or shortness of breath doubt cardiac.  Given NSAID therapy already will trial prednisone and have her follow-up with her orthopedist that she follows for hand troubles or the sports medicine group at her preference. Patient verbalizes agreement with plan of care.   Final Clinical Impressions(s) / UC Diagnoses   Final diagnoses:  Strain of left shoulder, initial encounter  Acute pain of left shoulder     Discharge Instructions     Take the prednisone as prescribed Take 2 extra strength tylenol every 8 hours as prescribed  Do not take ibuprofen, advil, aleeve or naproxen while taking the prednisone  Schedule follow up with either your orthopedist or the sports medicine group  You  may also consider follow up with your PCP      ED Prescriptions    Medication Sig Dispense Auth. Provider   predniSONE (DELTASONE) 10 MG tablet Take 4 tablets (40 mg total) by mouth daily for 5 days. 20 tablet Anissia Wessells, Marguerita Beards, PA-C   acetaminophen (TYLENOL) 500 MG tablet Take 2 tablets (  1,000 mg total) by mouth every 8 (eight) hours as needed for moderate pain. 30 tablet Juwan Vences, Marguerita Beards, PA-C     I have reviewed the PDMP during this encounter.   Purnell Shoemaker, PA-C 08/21/19 1021

## 2019-08-21 NOTE — Discharge Instructions (Addendum)
Take the prednisone as prescribed Take 2 extra strength tylenol every 8 hours as prescribed  Do not take ibuprofen, advil, aleeve or naproxen while taking the prednisone  Schedule follow up with either your orthopedist or the sports medicine group  You  may also consider follow up with your PCP

## 2019-08-23 ENCOUNTER — Other Ambulatory Visit: Payer: Self-pay

## 2019-08-23 DIAGNOSIS — I10 Essential (primary) hypertension: Secondary | ICD-10-CM

## 2019-08-23 MED ORDER — AMLODIPINE BESYLATE 5 MG PO TABS: 5.0000 mg | ORAL_TABLET | Freq: Every day | ORAL | 3 refills | Status: DC

## 2019-09-05 ENCOUNTER — Ambulatory Visit: Payer: Self-pay

## 2019-09-05 ENCOUNTER — Ambulatory Visit (INDEPENDENT_AMBULATORY_CARE_PROVIDER_SITE_OTHER): Payer: Medicaid Other | Admitting: Family Medicine

## 2019-09-05 ENCOUNTER — Other Ambulatory Visit: Payer: Self-pay

## 2019-09-05 VITALS — BP 139/89 | Ht 66.0 in | Wt 165.0 lb

## 2019-09-05 DIAGNOSIS — M25512 Pain in left shoulder: Secondary | ICD-10-CM

## 2019-09-05 MED ORDER — IBUPROFEN 400 MG PO TABS: 400.0000 mg | ORAL_TABLET | Freq: Four times a day (QID) | ORAL | 0 refills | Status: DC | PRN

## 2019-09-05 MED ORDER — ACETAMINOPHEN 500 MG PO TABS: 500.0000 mg | ORAL_TABLET | Freq: Four times a day (QID) | ORAL | 0 refills | Status: DC | PRN

## 2019-09-05 NOTE — Patient Instructions (Signed)
Please take tylenol 500mg  and ibuprofen 400mg  three times daily for the next 5-7 days, then as needed. Please stop doing any shoulder exercises at the gym until your symptoms improve. Please do the home exercises you were provided 1-2 times per day. Please call to schedule follow up if you do not have improvement with this treatment.

## 2019-09-05 NOTE — Progress Notes (Signed)
PCP: Cleophas Dunker, DO  Subjective:   HPI: Patient is a 59 y.o. female here for L shoulder pain. Pain started about 1 month ago the day after she was doing some new exercises at the gym on a new machine which sounds like a lat pulldown bar. She did not have any pain during the exercise but developed pain over the following days. The pain is all over her shoulder but is worst on the lateral aspect of her shoulder, just lateral to the acromion. She also has pain in the front of her shoulder. Last week, she went to an urgent care, where they gave her a steroid dosepack with some improvement in her symptoms, however it has worsened since stopping. She has continued to do some shoulder exercises at the gym during this time. No radiation into extremity.  No numbness/tingling.  No prior left shoulder injuries.  Past Medical History:  Diagnosis Date  . Allergic rhinitis   . Anxiety   . Bacterial vaginosis   . Chronic post-traumatic stress disorder (PTSD)   . Cocaine abuse ([place])   . Depression   . ETOH abuse   . GERD (gastroesophageal reflux disease)   . Hyperlipidemia   . Hypertension    off meds due to weight loss  . Marijuana abuse   . Uterine leiomyoma     Current Outpatient Medications on File Prior to Visit  Medication Sig Dispense Refill  . acetaminophen (TYLENOL) 500 MG tablet Take 2 tablets (1,000 mg total) by mouth every 8 (eight) hours as needed for moderate pain. 30 tablet 0  . amLODipine (NORVASC) 5 MG tablet Take 1 tablet (5 mg total) by mouth at bedtime. Start taking when you decrease your clonidine to once a day. 90 tablet 3  . ezetimibe (ZETIA) 10 MG tablet Take 1 tablet (10 mg total) by mouth daily. 90 tablet 3  . ibuprofen (ADVIL) 600 MG tablet Take 1 tablet (600 mg total) by mouth every 8 (eight) hours as needed for mild pain or moderate pain. 30 tablet 0  . rosuvastatin (CRESTOR) 40 MG tablet Take 1 tablet (40 mg total) by mouth daily. 90 tablet 1  . Vitamin D,  Ergocalciferol, (DRISDOL) 1.25 MG (50000 UT) CAPS capsule TK 1 C PO ONCE A WEEK     No current facility-administered medications on file prior to visit.    Past Surgical History:  Procedure Laterality Date  . ABDOMINAL HYSTERECTOMY     left ovaries, took cervix.  Performed for heavy periods and anemia  . CHOLECYSTECTOMY    . TOE SURGERY Left   . TONSILLECTOMY    . TYMPANOSTOMY TUBE PLACEMENT      No Known Allergies  Social History   Socioeconomic History  . Marital status: Married    Spouse name: Tyrone  . Number of children: 4  . Years of education: Not on file  . Highest education level: Not on file  Occupational History  . Occupation: student    Comment: Pensions consultant  Tobacco Use  . Smoking status: Former Smoker    Types: Cigarettes  . Smokeless tobacco: Never Used  . Tobacco comment: never really smoked, when she did was about 1 cigarette a month  Vaping Use  . Vaping Use: Never used  Substance and Sexual Activity  . Alcohol use: Not Currently    Comment: quit drinking 2016  . Drug use: Not Currently    Types: Cocaine, Marijuana    Comment: reformed went through rehab  .  Sexual activity: Not on file  Other Topics Concern  . Not on file  Social History Narrative  . Not on file   Social Determinants of Health   Financial Resource Strain:   . Difficulty of Paying Living Expenses:   Food Insecurity:   . Worried About Charity fundraiser in the Last Year:   . Arboriculturist in the Last Year:   Transportation Needs:   . Film/video editor (Medical):   Marland Kitchen Lack of Transportation (Non-Medical):   Physical Activity:   . Days of Exercise per Week:   . Minutes of Exercise per Session:   Stress:   . Feeling of Stress :   Social Connections:   . Frequency of Communication with Friends and Family:   . Frequency of Social Gatherings with Friends and Family:   . Attends Religious Services:   . Active Member of Clubs or Organizations:   . Attends Theatre manager Meetings:   Marland Kitchen Marital Status:   Intimate Partner Violence:   . Fear of Current or Ex-Partner:   . Emotionally Abused:   Marland Kitchen Physically Abused:   . Sexually Abused:     Family History  Problem Relation Age of Onset  . Diabetes Mother   . Hypertension Mother   . Hypercholesterolemia Mother   . Heart attack Father   . Heart attack Brother   . Colon cancer Neg Hx   . Esophageal cancer Neg Hx   . Rectal cancer Neg Hx   . Stomach cancer Neg Hx   . Liver cancer Neg Hx   . Breast cancer Neg Hx     BP 139/89   Ht 5\' 6"  (1.676 m)   Wt 165 lb (74.8 kg)   BMI 26.63 kg/m   Review of Systems: See HPI above.     Objective:  Physical Exam:  Gen: NAD, comfortable in exam room  L shoulder exam: Inspection: no obvious erythema or swelling. No obvious abnormalities. Palpation: tender with palpation lateral to acromion process, and some mild tenderness with palpation anteriorly over biceps tendon.  AROM: intact to flexion, extension, IR, ER. Pain with abduction but full ROM.  Strength:  5/5 with empty can, resisted IR and ER. Special tests: negative Speeds, negative cross arm testing, Hawkins positive for pain  Complete left shoulder MSK u/s: Biceps tendon intact on long and trans views without abnormalities. Pec major tendon intact Subscapularis intact without tear but with hypoechoic change distally consistent with tendinopathy.  No impingement on dynamic views AC joint with mild arthropathy without effusion Infraspinatus intact without tear Supraspinatus intact without tear Mild subacromial bursitis Posterior glenohumeral joint appears normal - no effusion, labral tear, visible arthropathy.  Impression: Subacromial bursitis with rotator cuff tendinopathy.   Assessment & Plan:  1. Rotator Cuff tendinopathy: Pt with tendinopathy after new exercise. Plan to hold off on shoulder exercises at the gym, and will trial home PT exercises. Will also give tylenol 500mg  and  ibuprofen 400mg  TID for pain, then transition to PRN after 5-7 days. Follow up as needed.  2. Subacromial bursitis: will manage conservatively for now as above, could consider CSI if persistent.

## 2019-09-06 ENCOUNTER — Encounter: Payer: Self-pay | Admitting: Family Medicine

## 2019-09-21 ENCOUNTER — Ambulatory Visit (INDEPENDENT_AMBULATORY_CARE_PROVIDER_SITE_OTHER): Payer: Medicaid Other | Admitting: Family Medicine

## 2019-09-21 ENCOUNTER — Encounter: Payer: Self-pay | Admitting: Family Medicine

## 2019-09-21 ENCOUNTER — Other Ambulatory Visit: Payer: Self-pay

## 2019-09-21 VITALS — BP 120/78 | Ht 66.0 in | Wt 168.0 lb

## 2019-09-21 DIAGNOSIS — M7552 Bursitis of left shoulder: Secondary | ICD-10-CM

## 2019-09-21 DIAGNOSIS — M67912 Unspecified disorder of synovium and tendon, left shoulder: Secondary | ICD-10-CM | POA: Diagnosis not present

## 2019-09-21 MED ORDER — METHYLPREDNISOLONE ACETATE 40 MG/ML IJ SUSP
40.0000 mg | Freq: Once | INTRAMUSCULAR | Status: AC
  Administered 2019-09-21: 40 mg via INTRA_ARTICULAR

## 2019-09-21 NOTE — Progress Notes (Signed)
PCP: Cleophas Dunker, DO  Subjective:   HPI: Patient is a 59 y.o. female here for f/u on L shoulder pain. Pain started about 1.5 months ago the day after she was doing some new exercises at the gym on a new machine with a lot of overhead exercises.  Ultrasound on that visit showed subscapularis tendinopathy, along with subacromial bursitis.  This was treated conservatively with anti-inflammatories, relative rest, and home PT.  She reports that this was slightly helpful but she continues have significant pain.  She is wondering about potential injection into the shoulder today.  She denies any new reinjury to the shoulder, it just is not getting better.  Past Medical History:  Diagnosis Date  . Allergic rhinitis   . Anxiety   . Bacterial vaginosis   . Chronic post-traumatic stress disorder (PTSD)   . Cocaine abuse ([place])   . Depression   . ETOH abuse   . GERD (gastroesophageal reflux disease)   . Hyperlipidemia   . Hypertension    off meds due to weight loss  . Marijuana abuse   . Uterine leiomyoma     Current Outpatient Medications on File Prior to Visit  Medication Sig Dispense Refill  . acetaminophen (TYLENOL) 500 MG tablet Take 2 tablets (1,000 mg total) by mouth every 8 (eight) hours as needed for moderate pain. 30 tablet 0  . amLODipine (NORVASC) 5 MG tablet Take 1 tablet (5 mg total) by mouth at bedtime. Start taking when you decrease your clonidine to once a day. 90 tablet 3  . ezetimibe (ZETIA) 10 MG tablet Take 1 tablet (10 mg total) by mouth daily. 90 tablet 3  . ibuprofen (ADVIL) 400 MG tablet Take 1 tablet (400 mg total) by mouth every 6 (six) hours as needed. 30 tablet 0  . rosuvastatin (CRESTOR) 40 MG tablet Take 1 tablet (40 mg total) by mouth daily. 90 tablet 1  . Vitamin D, Ergocalciferol, (DRISDOL) 1.25 MG (50000 UT) CAPS capsule TK 1 C PO ONCE A WEEK     No current facility-administered medications on file prior to visit.    Past Surgical History:   Procedure Laterality Date  . ABDOMINAL HYSTERECTOMY     left ovaries, took cervix.  Performed for heavy periods and anemia  . CHOLECYSTECTOMY    . TOE SURGERY Left   . TONSILLECTOMY    . TYMPANOSTOMY TUBE PLACEMENT      No Known Allergies  Social History   Socioeconomic History  . Marital status: Married    Spouse name: Tyrone  . Number of children: 4  . Years of education: Not on file  . Highest education level: Not on file  Occupational History  . Occupation: student    Comment: Pensions consultant  Tobacco Use  . Smoking status: Former Smoker    Types: Cigarettes  . Smokeless tobacco: Never Used  . Tobacco comment: never really smoked, when she did was about 1 cigarette a month  Vaping Use  . Vaping Use: Never used  Substance and Sexual Activity  . Alcohol use: Not Currently    Comment: quit drinking 2016  . Drug use: Not Currently    Types: Cocaine, Marijuana    Comment: reformed went through rehab  . Sexual activity: Not on file  Other Topics Concern  . Not on file  Social History Narrative  . Not on file   Social Determinants of Health   Financial Resource Strain:   . Difficulty of Paying Living Expenses:  Food Insecurity:   . Worried About Charity fundraiser in the Last Year:   . Arboriculturist in the Last Year:   Transportation Needs:   . Film/video editor (Medical):   Marland Kitchen Lack of Transportation (Non-Medical):   Physical Activity:   . Days of Exercise per Week:   . Minutes of Exercise per Session:   Stress:   . Feeling of Stress :   Social Connections:   . Frequency of Communication with Friends and Family:   . Frequency of Social Gatherings with Friends and Family:   . Attends Religious Services:   . Active Member of Clubs or Organizations:   . Attends Archivist Meetings:   Marland Kitchen Marital Status:   Intimate Partner Violence:   . Fear of Current or Ex-Partner:   . Emotionally Abused:   Marland Kitchen Physically Abused:   . Sexually Abused:      Family History  Problem Relation Age of Onset  . Diabetes Mother   . Hypertension Mother   . Hypercholesterolemia Mother   . Heart attack Father   . Heart attack Brother   . Colon cancer Neg Hx   . Esophageal cancer Neg Hx   . Rectal cancer Neg Hx   . Stomach cancer Neg Hx   . Liver cancer Neg Hx   . Breast cancer Neg Hx     BP 120/78   Ht 5\' 6"  (1.676 m)   Wt 168 lb (76.2 kg)   BMI 27.12 kg/m   Review of Systems: See HPI above.     Objective:  Physical Exam:  Gen: NAD, comfortable in exam room  L shoulder exam: Inspection: no obvious erythema or swelling. No obvious abnormalities. Palpation: tender with palpation lateral to acromion process, and some mild tenderness with palpation anteriorly over biceps tendon.  AROM: intact to flexion, extension, IR, ER. Pain with abduction but full ROM.  Strength:  5/5 with empty can, resisted IR and ER. Special tests: negative cross arm testing, Hawkins positive for pain   Assessment & Plan:  1. Rotator Cuff tendinopathy 2. Subacromial bursitis  Patient continues to have pain despite conservative therapies over the past 2 weeks.  We discussed options, she would like to proceed with CSI today.  Subacromial CSI was performed under ultrasound, see procedure note for details.  Recommended continuing to hold off on shoulder exercises at the gym, and continue home PT exercises. Follow up as needed.    Procedure Note:  Following the description of risks including infection bleeding, damage to surrounding structures, patient provided written consent for left subacromial ultrasound guided corticosteroid injection procedure. Timeout was performed.  Patient was sterilely prepped in the usual fashion with alcohol swab. Following topical anesthetization with ethyl chloride the subacromial bursa was identified under ultrasound guidance and injected with a solution of 3 mL bupivicaine and 1 mL cortisone. Patient tolerated well without  complication. Precautions provided.

## 2019-09-25 ENCOUNTER — Other Ambulatory Visit: Payer: Self-pay

## 2019-09-25 ENCOUNTER — Encounter: Payer: Self-pay | Admitting: Family Medicine

## 2019-09-25 ENCOUNTER — Ambulatory Visit (INDEPENDENT_AMBULATORY_CARE_PROVIDER_SITE_OTHER): Payer: Medicaid Other | Admitting: Family Medicine

## 2019-09-25 VITALS — BP 102/60 | HR 67 | Ht 66.0 in | Wt 173.2 lb

## 2019-09-25 DIAGNOSIS — S76312A Strain of muscle, fascia and tendon of the posterior muscle group at thigh level, left thigh, initial encounter: Secondary | ICD-10-CM | POA: Diagnosis not present

## 2019-09-25 DIAGNOSIS — T148XXA Other injury of unspecified body region, initial encounter: Secondary | ICD-10-CM

## 2019-09-25 DIAGNOSIS — S76319A Strain of muscle, fascia and tendon of the posterior muscle group at thigh level, unspecified thigh, initial encounter: Secondary | ICD-10-CM | POA: Insufficient documentation

## 2019-09-25 MED ORDER — KETOROLAC TROMETHAMINE 30 MG/ML IJ SOLN
30.0000 mg | Freq: Once | INTRAMUSCULAR | Status: AC
  Administered 2019-09-25: 30 mg via INTRAMUSCULAR

## 2019-09-25 NOTE — Assessment & Plan Note (Signed)
Most likely muscular strain due to overuse injury.  Very low suspicion for neurological injury or arthritis related pathology.  Most likely biceps femoris strain.  Minor based on the fact that does not appear to be any significant bruising (patient reported no bruising). -Toradol injection in clinic today (per patient request) -Continue Motrin at home as needed for discomfort -Heat and ice as needed -Reduce cardio exercises for the next 1-2 weeks of his pain significantly improves.

## 2019-09-25 NOTE — Progress Notes (Signed)
    SUBJECTIVE:   CHIEF COMPLAINT / HPI:   Hamstring strain Tonya Mckenzie reports that she has been having left-sided leg pain for about 2 weeks since she had an intense workout on the stairmaster.  She does not usually use the stairmaster at the gym but reports walking 3 miles on it about 2 weeks ago.  Following that workout, she has had significant tightness and discomfort in the back of her left leg.  She did experience some similar discomfort on the right side which is resolved in the past week.  Her current discomfort is on the back of her left leg primarily behind her knee and midway up her thigh.  She has not noticed any numbness or tingling or shooting pain.  PERTINENT  PMH / PSH: Frequent MSK issues in the past several months  OBJECTIVE:   BP (!) 102/60   Pulse 67   Ht 5\' 6"  (1.676 m)   Wt 173 lb 4 oz (78.6 kg)   SpO2 97%   BMI 27.96 kg/m    General: Alert and cooperative and appears to be in no acute distress Respiratory: Breathing comfortably on room air respiratory distress Lower extremities: Straight leg raise negative bilaterally.  No significant tenderness or pain with FABER and FADIR testing.  No tenderness with percussion of the lumbar spine.  Paraspinal tenderness some tenderness noted on the posterior, lateral aspect of her thigh and popliteal fossa.  Hand strength strength equal bilaterally.  Reproducible discomfort with hamstring tension.   ASSESSMENT/PLAN:   Hamstring strain Most likely muscular strain due to overuse injury.  Very low suspicion for neurological injury or arthritis related pathology.  Most likely biceps femoris strain.  Minor based on the fact that does not appear to be any significant bruising (patient reported no bruising). -Toradol injection in clinic today (per patient request) -Continue Motrin at home as needed for discomfort -Heat and ice as needed -Reduce cardio exercises for the next 1-2 weeks of his pain significantly improves.     Matilde Haymaker, MD [place]

## 2019-09-25 NOTE — Patient Instructions (Signed)
I think that you have a muscle strain in your left leg.  Based on your physical exam and how you are describing it, I think it is likely this is a strain of your biceps femoris muscle.  I think that you should reduce your exercise for the next week or 2 and to experience significant improvement.  We will give you a shot today of an NSAID.  At home, you can continue taking Motrin to help reduce inflammation and you can do heating or cooling on the painful areas.  I recommend freezing a water bottle and rolling on that to help massage and cool the injured area.  I would give it at least 2 more weeks to slowly heal.  If you do not experience any improvement in the next 2 weeks, come back to clinic.

## 2019-10-03 ENCOUNTER — Ambulatory Visit (HOSPITAL_COMMUNITY)
Admission: EM | Admit: 2019-10-03 | Discharge: 2019-10-03 | Disposition: A | Discharge status: Home / Self Care | Payer: Medicaid Other | Attending: Family Medicine | Admitting: Family Medicine

## 2019-10-03 ENCOUNTER — Encounter (HOSPITAL_COMMUNITY): Payer: Self-pay | Admitting: Family Medicine

## 2019-10-03 ENCOUNTER — Other Ambulatory Visit: Payer: Self-pay

## 2019-10-03 DIAGNOSIS — S76312S Strain of muscle, fascia and tendon of the posterior muscle group at thigh level, left thigh, sequela: Secondary | ICD-10-CM

## 2019-10-03 MED ORDER — KETOROLAC TROMETHAMINE 30 MG/ML IJ SOLN: 30.0000 mg | Freq: Once | INTRAMUSCULAR | Status: DC

## 2019-10-03 MED ORDER — KETOROLAC TROMETHAMINE 30 MG/ML IJ SOLN
30.0000 mg | Freq: Once | INTRAMUSCULAR | Status: AC
  Administered 2019-10-03: 30 mg via INTRAMUSCULAR

## 2019-10-03 MED ORDER — KETOROLAC TROMETHAMINE 30 MG/ML IJ SOLN
INTRAMUSCULAR | Status: AC
  Filled 2019-10-03: qty 1

## 2019-10-03 NOTE — ED Provider Notes (Addendum)
[facility]    CSN: [id number] Arrival date & time: 10/03/19  1115      History   Chief Complaint Chief Complaint  Patient presents with  . Muscle Pain    HPI Tonya Mckenzie is a 59 y.o. female.   Patient is a 59 year old female presents today with continued left leg pain.  Was seen and evaluated for this on 09/25/19 by her primary care and diagnosed with hamstring strain and told to do conservative measures.  She is here for continued pain although the problem is improving  Pain is worse when she first wakes up in the morning or if she stands for long period of time.  Otherwise the pain decreases with movement.  She has continued to exercise lightly over the last 2 weeks. Has been stretching. Describes as pulling sensation.   She has been taking Tylenol and ibuprofen with some relief.  No new injuries.  No bruising, swelling, calf pain.  ROS per HPI      Past Medical History:  Diagnosis Date  . Allergic rhinitis   . Anxiety   . Bacterial vaginosis   . Chronic post-traumatic stress disorder (PTSD)   . Cocaine abuse ([place])   . Depression   . ETOH abuse   . GERD (gastroesophageal reflux disease)   . Hyperlipidemia   . Hypertension    off meds due to weight loss  . Marijuana abuse   . Uterine leiomyoma     Patient Active Problem List   Diagnosis Date Noted  . Hamstring strain 09/25/2019  . Vitamin D deficiency 06/19/2019  . Abdominal wall pain in left upper quadrant 05/15/2019  . Pap smear of cervix not needed 03/30/2019  . Encounter to establish care with new doctor 03/13/2019  . Hyperlipidemia 03/13/2019  . Essential hypertension 03/13/2019  . Encounter for hepatitis C screening test for low risk patient 03/13/2019  . Right hand pain 03/13/2019  . PTSD (post-traumatic stress disorder) 03/13/2019  . History of substance abuse ([place]) 03/13/2019  . Overweight (BMI 25.0-29.9) 03/13/2019  . 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 MDD (major depressive disorder), recurrent severe, without psychosis ([place]) 07/09/2017    Past Surgical History:  Procedure Laterality Date  . ABDOMINAL HYSTERECTOMY     left ovaries, took cervix.  Performed for heavy periods and anemia  . CHOLECYSTECTOMY    . TOE SURGERY Left   . TONSILLECTOMY    . TYMPANOSTOMY TUBE PLACEMENT      OB History    Gravida  4   Para  4   Term      Preterm      AB      Living        SAB      TAB      Ectopic      Multiple      Live Births               Home Medications    Prior to Admission medications   Medication Sig Start Date End Date Taking? Authorizing Provider  acetaminophen (TYLENOL) 500 MG tablet Take 2 tablets (1,000 mg total) by mouth every 8 (eight) hours as needed for moderate pain. 08/21/19  Yes Darr, Marguerita Beards, PA-C  amLODipine (NORVASC) 5 MG tablet Take 1 tablet (5 mg total) by mouth at bedtime. Start taking when you decrease your clonidine to once a day. 08/23/19  Yes Meccariello, Bernita Raisin, DO  ezetimibe (ZETIA) 10 MG  tablet Take 1 tablet (10 mg total) by mouth daily. 08/02/19  Yes Meccariello, Bernita Raisin, DO  ibuprofen (ADVIL) 400 MG tablet Take 1 tablet (400 mg total) by mouth every 6 (six) hours as needed. 09/05/19  Yes Hudnall, Sharyn Lull, MD  rosuvastatin (CRESTOR) 40 MG tablet Take 1 tablet (40 mg total) by mouth daily. 08/02/19  Yes Meccariello, Bernita Raisin, DO  Vitamin D, Ergocalciferol, (DRISDOL) 1.25 MG (50000 UT) CAPS capsule TK 1 C PO ONCE A WEEK 08/25/18  Yes [provider]    Family History Family History  Problem Relation Age of Onset  . Diabetes Mother   . Hypertension Mother   . Hypercholesterolemia Mother   . Heart attack Father   . Heart attack Brother   . Colon cancer Neg Hx   . Esophageal cancer Neg Hx   . Rectal cancer Neg Hx   . Stomach cancer Neg Hx   . Liver cancer Neg Hx   . Breast cancer Neg Hx     Social History Social History   Tobacco Use  . Smoking status: Former Smoker      Types: Cigarettes  . Smokeless tobacco: Never Used  . Tobacco comment: never really smoked, when she did was about 1 cigarette a month  Vaping Use  . Vaping Use: Never used  Substance Use Topics  . Alcohol use: Not Currently    Comment: quit drinking 2016  . Drug use: Not Currently    Types: Cocaine, Marijuana    Comment: reformed went through rehab     Allergies   Patient has no known allergies.   Review of Systems Review of Systems   Physical Exam Triage Vital Signs ED Triage Vitals  Enc Vitals Group     BP 10/03/19 1210 (!) 134/92     Pulse Rate 10/03/19 1210 (!) 55     Resp 10/03/19 1210 17     Temp 10/03/19 1210 (!) 97.4 F (36.3 C)     Temp Source 10/03/19 1210 Oral     SpO2 10/03/19 1210 98 %     Weight --      Height --      Head Circumference --      Peak Flow --      Pain Score 10/03/19 1207 10     Pain Loc --      Pain Edu? --      Excl. in [place]? --    No data found.  Updated Vital Signs BP (!) 134/92 (BP Location: Right Arm)   Pulse (!) 55   Temp (!) 97.4 F (36.3 C) (Oral)   Resp 17   SpO2 98%   Visual Acuity Right Eye Distance:   Left Eye Distance:   Bilateral Distance:    Right Eye Near:   Left Eye Near:    Bilateral Near:     Physical Exam Vitals and nursing note reviewed.  Constitutional:      General: She is not in acute distress.    Appearance: Normal appearance. She is not ill-appearing, toxic-appearing or diaphoretic.  HENT:     Head: Normocephalic.     Nose: Nose normal.     Mouth/Throat:     Pharynx: Oropharynx is clear.  Eyes:     Conjunctiva/sclera: Conjunctivae normal.  Pulmonary:     Effort: Pulmonary effort is normal.  Abdominal:     Palpations: Abdomen is soft.     Tenderness: There is no abdominal tenderness.  Musculoskeletal:  General: Normal range of motion.     Cervical back: Normal range of motion.       Legs:  Skin:    General: Skin is warm and dry.     Findings: No rash.  Neurological:      Mental Status: She is alert.  Psychiatric:        Mood and Affect: Mood normal.      UC Treatments / Results  Labs (all labs ordered are listed, but only abnormal results are displayed) Labs Reviewed - No data to display  EKG   Radiology No results found.  Procedures Procedures (including critical care time)  Medications Ordered in UC Medications  ketorolac (TORADOL) 30 MG/ML injection 30 mg (30 mg Intramuscular Given 10/03/19 1250)    Initial Impression / Assessment and Plan / UC Course  I have reviewed the triage vital signs and the nursing notes.  Pertinent labs & imaging results that were available during my care of the patient were reviewed by me and considered in my medical decision making (see chart for details).     Hamstring strain Recommend this can take weeks before it starts to feel better.  She has still been exercising.  Recommend to try to rest as much as possible and not do any strenuous exercise at this time. Patient requested Toradol injection.  Toradol given here for pain.  She can do ibuprofen for pain at home Information given on stretches For continued problems she will need to follow-up with sports medicine specialist Final Clinical Impressions(s) / UC Diagnoses   Final diagnoses:  Hamstring strain, left, sequela     Discharge Instructions     You need to rest the leg and give it time to heal Toradol given here for pain.  I have attached some stretches to do.  Ibuprofen for pain as needed at home.  Sports medicine follow up if needed.     ED Prescriptions    None     PDMP not reviewed this encounter.   Orvan July, NP 10/03/19 1301    Loura Halt A, NP 10/03/19 1303

## 2019-10-03 NOTE — ED Triage Notes (Signed)
Pt presents with muscle pain in legs xs 2 weeks. States pain is worse in the morning. Pain goes away with movement. Tylenol and Ibuprofen given minimal relief. States she is active in the gym and got on a new piece of equipment 2-3 weeks ago and notice the pain in her legs 2 days after.

## 2019-10-03 NOTE — Discharge Instructions (Signed)
You need to rest the leg and give it time to heal Toradol given here for pain.  I have attached some stretches to do.  Ibuprofen for pain as needed at home.  Sports medicine follow up if needed.

## 2019-10-18 ENCOUNTER — Ambulatory Visit (INDEPENDENT_AMBULATORY_CARE_PROVIDER_SITE_OTHER): Payer: Medicaid Other | Admitting: Family Medicine

## 2019-10-18 ENCOUNTER — Other Ambulatory Visit: Payer: Self-pay

## 2019-10-18 ENCOUNTER — Encounter: Payer: Self-pay | Admitting: Family Medicine

## 2019-10-18 VITALS — BP 112/62 | HR 63 | Wt 168.8 lb

## 2019-10-18 DIAGNOSIS — I83813 Varicose veins of bilateral lower extremities with pain: Secondary | ICD-10-CM

## 2019-10-18 NOTE — Progress Notes (Signed)
    SUBJECTIVE:   CHIEF COMPLAINT / HPI:   Knots in Legs Patient has 4 knots in legs Has been watching them for a year Had hamstring injury recently and when that improved, she started to notice achiness in knots in her legs In bilateral legs in same spots Describes it as achiness Thinks the areas have gotten larger Aren't tender Nothing has made it better Got a toradol shot for her hamstring that helped with the pain Sometimes throbbing Hasn't been working out as much because she's trying to make the soreness go away No chest pain, shortness of breath, calf tenderness or swelling  PERTINENT  PMH / PSH:  HTN, HLD  OBJECTIVE:   BP 112/62   Pulse 63   Wt 168 lb 12.8 oz (76.6 kg)   SpO2 98%   BMI 27.25 kg/m   Physical Exam:  General: 59 y.o. female in NAD Lungs: Breathing comfortably on RA Skin: warm and dry Extremities: No edema, 4 <1cm circular, soft, compressible structures in line with visible veins on b/l lateral lower legs  Limited US performed pn left lower leg with Dr. Saul Fordyce Supervision Hypoechoic collection with doppler flow seen   ASSESSMENT/PLAN:   Varicose veins of bilateral lower extremities with pain Ultrasound and examination consistent with varicose veins.  Patient does not have any family history of varicose veins, has reported 4 previous pregnancies.  Discussed how this can be a risk factor.  Patient is not overweight, therefore this is likely not contributing.  Discussed treatment options with patient including compression stockings, horse Chestnut seed extract, and cosmetic procedures performed by plastic surgery or dermatology.  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  Patient would like to start with compression stockings and horse Chestnut seed extract.  She was given information on this.  Also advised that she can use Tylenol extra strength at night for pain and heat or ice if help.  She will return if no improvement.  She was reassured  by the ultrasound that these are not tumors.     Cleophas Dunker, [place]

## 2019-10-18 NOTE — Assessment & Plan Note (Signed)
Ultrasound and examination consistent with varicose veins.  Patient does not have any family history of varicose veins, has reported 4 previous pregnancies.  Discussed how this can be a risk factor.  Patient is not overweight, therefore this is likely not contributing.  Discussed treatment options with patient including compression stockings, horse Chestnut seed extract, and cosmetic procedures performed by plastic surgery or dermatology.  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  Patient would like to start with compression stockings and horse Chestnut seed extract.  She was given information on this.  Also advised that she can use Tylenol extra strength at night for pain and heat or ice if help.  She will return if no improvement.  She was reassured by the ultrasound that these are not tumors.

## 2019-10-18 NOTE — Patient Instructions (Signed)
Thank you for coming to see me today. It was a pleasure. Today we talked about:   Your pain and lumps are coming from varicose veins.  You can rty compression stockings (you can get them from [facility] or scrub stores) especially while you are on your feet and working, you could also try horse chestnut seed extract (looks like some places have bottles for $6-8).  Tylenol, and heat or ice can also help at night, but over time, if you are using compression stockings, this should get better.  There are some cosmetic procedures you could have if these things don't work, but insurance usually doesn't cover this because it is considered cosmetic.  Please follow-up with me in 6 months or sooner as needed.  If you have any questions or concerns, please do not hesitate to call the office at [phone].  Best,   [place] Constable, DO

## 2019-10-31 ENCOUNTER — Ambulatory Visit (INDEPENDENT_AMBULATORY_CARE_PROVIDER_SITE_OTHER): Payer: Medicaid Other | Admitting: Family Medicine

## 2019-10-31 ENCOUNTER — Encounter: Payer: Self-pay | Admitting: Family Medicine

## 2019-10-31 ENCOUNTER — Other Ambulatory Visit: Payer: Self-pay

## 2019-10-31 ENCOUNTER — Ambulatory Visit: Payer: Self-pay

## 2019-10-31 VITALS — BP 120/78 | Ht 66.0 in | Wt 168.0 lb

## 2019-10-31 DIAGNOSIS — M25512 Pain in left shoulder: Secondary | ICD-10-CM | POA: Diagnosis present

## 2019-10-31 DIAGNOSIS — S76312A Strain of muscle, fascia and tendon of the posterior muscle group at thigh level, left thigh, initial encounter: Secondary | ICD-10-CM

## 2019-10-31 MED ORDER — METHYLPREDNISOLONE ACETATE 40 MG/ML IJ SUSP
40.0000 mg | Freq: Once | INTRAMUSCULAR | Status: AC
  Administered 2019-10-31: 40 mg via INTRA_ARTICULAR

## 2019-10-31 NOTE — Addendum Note (Signed)
Addended by: Jolinda Croak E on: 10/31/2019 10:52 AM   Modules accepted: Orders

## 2019-10-31 NOTE — Patient Instructions (Signed)
You strained your rotator cuff with recurrence of the bursitis. Try to avoid painful activities (overhead activities, lifting with extended arm) as much as possible. Aleve 2 tabs twice a day with food OR ibuprofen 3 tabs three times a day with food for pain and inflammation. Can take tylenol in addition to this. Subacromial injection may be beneficial to help with pain and to decrease inflammation - you were given this today. Consider physical therapy with transition to home exercise program. Take it easy for 1 week then you can restart home exercises. Do home exercise program with theraband and scapular stabilization exercises daily 3 sets of 10 once a day. If not improving at follow-up we will consider further imaging. Follow up with me in 6 weeks.  You also strained your distal hamstring and popliteus muscle. Wear compression sleeve when up and walking around for next 6 weeks if tolerated. Medications as noted above. Heat 15 minutes at a time 3-4 times a day. Leg curls, hamstring swings - add 2 pound weight with time if these are too easy. 3 sets of 10 once or twice a day. Consider physical therapy as well. Follow up with me in 6 weeks.

## 2019-10-31 NOTE — Progress Notes (Signed)
PCP: Cleophas Dunker, DO  Subjective:   HPI: Patient is a 59 y.o. female here for left shoulder pain, left thigh pain.  7/23: Patient is a 59 y.o. female here for f/u on L shoulder pain. Pain started about 1.5 months ago the day after she was doing some new exercises at the gym on a new machine with a lot of overhead exercises.  Ultrasound on that visit showed subscapularis tendinopathy, along with subacromial bursitis.  This was treated conservatively with anti-inflammatories, relative rest, and home PT.  She reports that this was slightly helpful but she continues have significant pain.  She is wondering about potential injection into the shoulder today.  She denies any new reinjury to the shoulder, it just is not getting better.  9/1: Patient reports she completely improved after last visit following subacromial injection and home exercises. That she states about 3 weeks ago her grandchild was on the monkey bars and she went to grab them, twisted feeling some pain in the distal posterior left thigh as well as recurrence of pain in the left lateral shoulder. Since then she has been unable to sleep at night on her left side. No swelling or bruising. No numbness or tingling. No radiation of pain.  Past Medical History:  Diagnosis Date  . Allergic rhinitis   . Anxiety   . Bacterial vaginosis   . Chronic post-traumatic stress disorder (PTSD)   . Cocaine abuse ([place])   . Depression   . ETOH abuse   . GERD (gastroesophageal reflux disease)   . Hyperlipidemia   . Hypertension    off meds due to weight loss  . Marijuana abuse   . Uterine leiomyoma     Current Outpatient Medications on File Prior to Visit  Medication Sig Dispense Refill  . acetaminophen (TYLENOL) 500 MG tablet Take 2 tablets (1,000 mg total) by mouth every 8 (eight) hours as needed for moderate pain. 30 tablet 0  . amLODipine (NORVASC) 5 MG tablet Take 1 tablet (5 mg total) by mouth at bedtime. Start taking when  you decrease your clonidine to once a day. 90 tablet 3  . ezetimibe (ZETIA) 10 MG tablet Take 1 tablet (10 mg total) by mouth daily. 90 tablet 3  . ibuprofen (ADVIL) 400 MG tablet Take 1 tablet (400 mg total) by mouth every 6 (six) hours as needed. 30 tablet 0  . rosuvastatin (CRESTOR) 40 MG tablet Take 1 tablet (40 mg total) by mouth daily. 90 tablet 1  . Vitamin D, Ergocalciferol, (DRISDOL) 1.25 MG (50000 UT) CAPS capsule TK 1 C PO ONCE A WEEK     No current facility-administered medications on file prior to visit.    Past Surgical History:  Procedure Laterality Date  . ABDOMINAL HYSTERECTOMY     left ovaries, took cervix.  Performed for heavy periods and anemia  . CHOLECYSTECTOMY    . TOE SURGERY Left   . TONSILLECTOMY    . TYMPANOSTOMY TUBE PLACEMENT      No Known Allergies  Social History   Socioeconomic History  . Marital status: Married    Spouse name: Tyrone  . Number of children: 4  . Years of education: Not on file  . Highest education level: Not on file  Occupational History  . Occupation: student    Comment: Pensions consultant  Tobacco Use  . Smoking status: Former Smoker    Types: Cigarettes  . Smokeless tobacco: Never Used  . Tobacco comment: never really smoked, when she  did was about 1 cigarette a month  Vaping Use  . Vaping Use: Never used  Substance and Sexual Activity  . Alcohol use: Not Currently    Comment: quit drinking 2016  . Drug use: Not Currently    Types: Cocaine, Marijuana    Comment: reformed went through rehab  . Sexual activity: Not on file  Other Topics Concern  . Not on file  Social History Narrative  . Not on file   Social Determinants of Health   Financial Resource Strain:   . Difficulty of Paying Living Expenses: Not on file  Food Insecurity:   . Worried About Charity fundraiser in the Last Year: Not on file  . Ran Out of Food in the Last Year: Not on file  Transportation Needs:   . Lack of Transportation (Medical): Not on  file  . Lack of Transportation (Non-Medical): Not on file  Physical Activity:   . Days of Exercise per Week: Not on file  . Minutes of Exercise per Session: Not on file  Stress:   . Feeling of Stress : Not on file  Social Connections:   . Frequency of Communication with Friends and Family: Not on file  . Frequency of Social Gatherings with Friends and Family: Not on file  . Attends Religious Services: Not on file  . Active Member of Clubs or Organizations: Not on file  . Attends Archivist Meetings: Not on file  . Marital Status: Not on file  Intimate Partner Violence:   . Fear of Current or Ex-Partner: Not on file  . Emotionally Abused: Not on file  . Physically Abused: Not on file  . Sexually Abused: Not on file    Family History  Problem Relation Age of Onset  . Diabetes Mother   . Hypertension Mother   . Hypercholesterolemia Mother   . Heart attack Father   . Heart attack Brother   . Colon cancer Neg Hx   . Esophageal cancer Neg Hx   . Rectal cancer Neg Hx   . Stomach cancer Neg Hx   . Liver cancer Neg Hx   . Breast cancer Neg Hx     BP 120/78   Ht 5\' 6"  (1.676 m)   Wt 168 lb (76.2 kg)   BMI 27.12 kg/m   Review of Systems: See HPI above.     Objective:  Physical Exam:  Gen: NAD, comfortable in exam room  Left shoulder: No swelling, ecchymoses.  No gross deformity. No TTP. FROM with painful arc. Positive Hawkins, Neers. Negative Yergasons. Strength 5/5 with empty can and resisted internal/external rotation.  Pain empty can and ER. NV intact distally.  Left knee/thigh: No gross deformity, ecchymoses, swelling. Mild TTP popliteal fossa, distal medial and lateral hamstrings.  No joint line tenderness. FROM with mild pain on resisted knee flexion at 30 degrees.. Negative ant/post drawers. Negative valgus/varus testing. Negative lachmans. Negative mcmurrays, apleys. NV intact distally.  Limited MSK u/s left shoulder: Supraspinatus,  infraspinatus, subscapularis visualized and no full-thickness tear appreciated.   Assessment & Plan:  1.  Left shoulder pain: Consistent with rotator cuff strain with recurrence of bursitis.  We repeated subacromial injection today.  Aleve or ibuprofen if needed, home exercises after about 1 week.  Follow-up in 6 weeks.  After informed written consent timeout was performed, patient was seated in chair in exam room. Left shoulder was prepped with alcohol swab and utilizing lateral approach with ultrasound guidance, patient's left subacromial space was  injected with 3:1 bupivicaine: depomedrol. Patient tolerated the procedure well without immediate complications.  2. Left hamstring/popliteus strain: Shown home exercises and stretches to do daily.  Heat as needed.  Consider compression sleeve.  Consider physical therapy as well.  Follow-up in 6 weeks.

## 2019-11-19 ENCOUNTER — Ambulatory Visit (INDEPENDENT_AMBULATORY_CARE_PROVIDER_SITE_OTHER): Payer: Medicaid Other | Admitting: Family Medicine

## 2019-11-19 ENCOUNTER — Other Ambulatory Visit: Payer: Self-pay

## 2019-11-19 ENCOUNTER — Encounter: Payer: Self-pay | Admitting: Family Medicine

## 2019-11-19 ENCOUNTER — Ambulatory Visit
Admission: RE | Admit: 2019-11-19 | Discharge: 2019-11-19 | Disposition: A | Discharge status: Home / Self Care | Payer: Medicaid Other | Source: Ambulatory Visit | Attending: Family Medicine | Admitting: Family Medicine

## 2019-11-19 VITALS — BP 124/82 | Ht 66.0 in | Wt 168.0 lb

## 2019-11-19 DIAGNOSIS — M25512 Pain in left shoulder: Secondary | ICD-10-CM

## 2019-11-19 MED ORDER — METHYLPREDNISOLONE ACETATE 80 MG/ML IJ SUSP
80.0000 mg | Freq: Once | INTRAMUSCULAR | Status: AC
  Administered 2019-11-19: 80 mg via INTRAMUSCULAR

## 2019-11-19 MED ORDER — KETOROLAC TROMETHAMINE 60 MG/2ML IM SOLN
60.0000 mg | Freq: Once | INTRAMUSCULAR | Status: AC
  Administered 2019-11-19: 60 mg via INTRAMUSCULAR

## 2019-11-19 MED ORDER — HYDROCODONE-ACETAMINOPHEN 7.5-325 MG PO TABS: 1.0000 | ORAL_TABLET | Freq: Four times a day (QID) | ORAL | 0 refills | Status: DC | PRN

## 2019-11-19 NOTE — Progress Notes (Signed)
PCP: Cleophas Dunker, DO  Subjective:   HPI: Patient is a 59 y.o. female here for left shoulder pain, left thigh pain.  7/23: Patient is a 59 y.o. female here for f/u on L shoulder pain. Pain started about 1.5 months ago the day after she was doing some new exercises at the gym on a new machine with a lot of overhead exercises.  Ultrasound on that visit showed subscapularis tendinopathy, along with subacromial bursitis.  This was treated conservatively with anti-inflammatories, relative rest, and home PT.  She reports that this was slightly helpful but she continues have significant pain.  She is wondering about potential injection into the shoulder today.  She denies any new reinjury to the shoulder, it just is not getting better.  9/1: Patient reports she completely improved after last visit following subacromial injection and home exercises. That she states about 3 weeks ago her grandchild was on the monkey bars and she went to grab them, twisted feeling some pain in the distal posterior left thigh as well as recurrence of pain in the left lateral shoulder. Since then she has been unable to sleep at night on her left side. No swelling or bruising. No numbness or tingling. No radiation of pain.  9/20: Patient reports she continues to deal with a lot of pain lateral left shoulder. Steroid injections have only provided temporary relief. Pain worse at night and with trying to reach overhead. Feels like shoulder is 'bone on bone'  Past Medical History:  Diagnosis Date  . Allergic rhinitis   . Anxiety   . Bacterial vaginosis   . Chronic post-traumatic stress disorder (PTSD)   . Cocaine abuse ([place])   . Depression   . ETOH abuse   . GERD (gastroesophageal reflux disease)   . Hyperlipidemia   . Hypertension    off meds due to weight loss  . Marijuana abuse   . Uterine leiomyoma     Current Outpatient Medications on File Prior to Visit  Medication Sig Dispense Refill  .  amLODipine (NORVASC) 5 MG tablet Take 1 tablet (5 mg total) by mouth at bedtime. Start taking when you decrease your clonidine to once a day. 90 tablet 3  . ezetimibe (ZETIA) 10 MG tablet Take 1 tablet (10 mg total) by mouth daily. 90 tablet 3  . ibuprofen (ADVIL) 400 MG tablet Take 1 tablet (400 mg total) by mouth every 6 (six) hours as needed. 30 tablet 0  . rosuvastatin (CRESTOR) 40 MG tablet Take 1 tablet (40 mg total) by mouth daily. 90 tablet 1  . Vitamin D, Ergocalciferol, (DRISDOL) 1.25 MG (50000 UT) CAPS capsule TK 1 C PO ONCE A WEEK     No current facility-administered medications on file prior to visit.    Past Surgical History:  Procedure Laterality Date  . ABDOMINAL HYSTERECTOMY     left ovaries, took cervix.  Performed for heavy periods and anemia  . CHOLECYSTECTOMY    . TOE SURGERY Left   . TONSILLECTOMY    . TYMPANOSTOMY TUBE PLACEMENT      No Known Allergies  Social History   Socioeconomic History  . Marital status: Married    Spouse name: Tyrone  . Number of children: 4  . Years of education: Not on file  . Highest education level: Not on file  Occupational History  . Occupation: student    Comment: Pensions consultant  Tobacco Use  . Smoking status: Former Smoker    Types: Cigarettes  .  Smokeless tobacco: Never Used  . Tobacco comment: never really smoked, when she did was about 1 cigarette a month  Vaping Use  . Vaping Use: Never used  Substance and Sexual Activity  . Alcohol use: Not Currently    Comment: quit drinking 2016  . Drug use: Not Currently    Types: Cocaine, Marijuana    Comment: reformed went through rehab  . Sexual activity: Not on file  Other Topics Concern  . Not on file  Social History Narrative  . Not on file   Social Determinants of Health   Financial Resource Strain:   . Difficulty of Paying Living Expenses: Not on file  Food Insecurity:   . Worried About Charity fundraiser in the Last Year: Not on file  . Ran Out of Food in  the Last Year: Not on file  Transportation Needs:   . Lack of Transportation (Medical): Not on file  . Lack of Transportation (Non-Medical): Not on file  Physical Activity:   . Days of Exercise per Week: Not on file  . Minutes of Exercise per Session: Not on file  Stress:   . Feeling of Stress : Not on file  Social Connections:   . Frequency of Communication with Friends and Family: Not on file  . Frequency of Social Gatherings with Friends and Family: Not on file  . Attends Religious Services: Not on file  . Active Member of Clubs or Organizations: Not on file  . Attends Archivist Meetings: Not on file  . Marital Status: Not on file  Intimate Partner Violence:   . Fear of Current or Ex-Partner: Not on file  . Emotionally Abused: Not on file  . Physically Abused: Not on file  . Sexually Abused: Not on file    Family History  Problem Relation Age of Onset  . Diabetes Mother   . Hypertension Mother   . Hypercholesterolemia Mother   . Heart attack Father   . Heart attack Brother   . Colon cancer Neg Hx   . Esophageal cancer Neg Hx   . Rectal cancer Neg Hx   . Stomach cancer Neg Hx   . Liver cancer Neg Hx   . Breast cancer Neg Hx     BP 124/82   Ht 5\' 6"  (1.676 m)   Wt 168 lb (76.2 kg)   BMI 27.12 kg/m   Review of Systems: See HPI above.     Objective:  Physical Exam:  Gen: NAD, comfortable in exam room  Left shoulder: No swelling, ecchymoses.  No gross deformity. TTP lateral to acromion. FROM passively.  Pain with abduction and flexion, difficult to do so beyond 30 degrees due to pain. Positive Hawkins. Strength 3/5 with empty can and resisted external rotation.  5/5 with IR NV intact distally.   Assessment & Plan:  1.  Left shoulder pain: persistent despite extensive home exercise program, subacromial injection x 2.  Will go ahead with MRI to assess for rotator cuff tear.  No evidence adhesive capsulitis.  Aleve or ibuprofen.  Norco as needed  severe pain.  Ice or heat if needed.  F/u after MRI.

## 2019-11-19 NOTE — Patient Instructions (Signed)
We will go ahead with an MRI of your left shoulder to assess for a rotator cuff tear. Ok to take aleve OR ibuprofen as needed. Norco as needed for severe pain - do not take tylenol with this. Ice or heat if needed. Follow up with me after the MRI in a no charge visit to go over results.

## 2019-11-20 ENCOUNTER — Telehealth: Payer: Self-pay | Admitting: Family Medicine

## 2019-11-20 NOTE — Telephone Encounter (Signed)
Patient called states she is trying to schedule a MRI ,but is unsure where she is to go.--Advised pt will need office staff to research & call her back with information.  --Forwarding message to med asst to contact pt a# [phone].  --glh

## 2020-01-03 ENCOUNTER — Other Ambulatory Visit: Payer: Self-pay

## 2020-01-03 ENCOUNTER — Encounter: Payer: Self-pay | Admitting: Student in an Organized Health Care Education/Training Program

## 2020-01-03 ENCOUNTER — Ambulatory Visit (INDEPENDENT_AMBULATORY_CARE_PROVIDER_SITE_OTHER): Payer: Medicaid Other | Admitting: Family Medicine

## 2020-01-03 VITALS — BP 127/70 | HR 71 | Ht 66.0 in | Wt 169.4 lb

## 2020-01-03 DIAGNOSIS — R300 Dysuria: Secondary | ICD-10-CM | POA: Diagnosis not present

## 2020-01-03 DIAGNOSIS — R0789 Other chest pain: Secondary | ICD-10-CM | POA: Diagnosis not present

## 2020-01-03 DIAGNOSIS — N898 Other specified noninflammatory disorders of vagina: Secondary | ICD-10-CM

## 2020-01-03 DIAGNOSIS — B9689 Other specified bacterial agents as the cause of diseases classified elsewhere: Secondary | ICD-10-CM | POA: Diagnosis not present

## 2020-01-03 DIAGNOSIS — N760 Acute vaginitis: Secondary | ICD-10-CM | POA: Diagnosis present

## 2020-01-03 LAB — POCT URINALYSIS DIP (MANUAL ENTRY)
Bilirubin, UA: NEGATIVE
Blood, UA: NEGATIVE
Glucose, UA: NEGATIVE mg/dL
Leukocytes, UA: NEGATIVE
Nitrite, UA: NEGATIVE
Protein Ur, POC: NEGATIVE mg/dL
Spec Grav, UA: >=1.03 — AB (ref 1.010–1.025)
Urobilinogen, UA: 0.2 E.U./dL
pH, UA: 5 (ref 5.0–8.0)

## 2020-01-03 LAB — POCT WET PREP (WET MOUNT)
Clue Cells Wet Prep Whiff POC: POSITIVE
Trichomonas Wet Prep HPF POC: ABSENT

## 2020-01-03 LAB — POCT UA - MICROSCOPIC ONLY: Epithelial cells, urine per micros: >20

## 2020-01-03 MED ORDER — FLUCONAZOLE 150 MG PO TABS: 150.0000 mg | ORAL_TABLET | Freq: Once | ORAL | 0 refills | Status: AC

## 2020-01-03 MED ORDER — METRONIDAZOLE 500 MG PO TABS: 500.0000 mg | ORAL_TABLET | Freq: Two times a day (BID) | ORAL | 0 refills | Status: AC

## 2020-01-03 NOTE — Patient Instructions (Signed)

## 2020-01-03 NOTE — Progress Notes (Signed)
    SUBJECTIVE:   CHIEF COMPLAINT / HPI:   Vaginal Itching The patient's primary symptoms include genital itching. Primary symptoms comment: C/O Vaginal discharge x 1 week. This is a new problem. The problem has been unchanged. Pregnant now: Posthysterectomy. Associated symptoms include dysuria. Pertinent negatives include no abdominal pain, fever, flank pain, frequency, hematuria, nausea or vomiting. Nothing aggravates the symptoms. She has tried nothing for the symptoms. She is sexually active. No, her partner does not have an STD. She uses hysterectomy for contraception. She is postmenopausal (S/P Hysterectoy).   Dysuria  This is a new problem. Episode onset: On and off x 6 months. The problem has been unchanged. The pain is at a severity of 7/10 (Pain only with urination. Suprapubic pain). There has been no fever. Associated symptoms include a discharge. Pertinent negatives include no flank pain, frequency, hematuria, nausea or vomiting. Treatments tried: AZO. The treatment provided no relief.   Chest pain: C/O right side chest pain on and off after exercising or weight lifting. Feels like a pinch or throb in her chest. Her symptoms last a few minutes and then resolves. She denies stress at work or home. 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   PERTINENT  PMH / PSH: PMX reviewed  OBJECTIVE:   BP 127/70   Pulse 71   Ht 5\' 6"  (1.676 m)   Wt 169 lb 6.4 oz (76.8 kg)   SpO2 99%   BMI 27.34 kg/m    Physical Exam Vitals and nursing note reviewed. Exam conducted with a chaperone present Leonia Corona).  Cardiovascular:     Rate and Rhythm: Normal rate and regular rhythm.     Heart sounds: Normal heart sounds.  Pulmonary:     Effort: Pulmonary effort is normal. No respiratory distress.     Breath sounds: Normal breath sounds. No wheezing.  Abdominal:     General: Abdomen is flat. Bowel sounds are normal. There is no distension.     Palpations: Abdomen is soft. There is no mass.      Tenderness: There is no abdominal tenderness.  Genitourinary:    Exam position: Lithotomy position.     Labia:        Right: No tenderness.        Left: No tenderness.      Vagina: Vaginal discharge present.     Comments: Cervix absent. +creamy white discharge     ASSESSMENT/PLAN:   Vaginitis: + Wet prep for BV Result discussed with her. She endorsed hx of yeast infection following A/B treatment. I escribed Flagyl 500 mg BID x 7 days for BV followed by 11 dose of Diflucan for yeast prophylaxis. F/U soon if there is no symptoms improvement. Not that she declined STD screen since she is not sexually active.  Dysuria: UA not suggestive of UTI Treat BV as above. I advised her to return soon to her PCP should her symptoms persists. She agreed with the plan.  Non-Cardiac related chest pain: Currently asymptomatic. Likely related to muscle strain from overuse. Conservative measures for now.  Andrena Mews, MD [facility]

## 2020-01-08 ENCOUNTER — Ambulatory Visit: Payer: Medicaid Other | Admitting: Sports Medicine

## 2020-01-08 ENCOUNTER — Other Ambulatory Visit: Payer: Self-pay

## 2020-01-08 VITALS — BP 102/64 | Ht 66.0 in | Wt 168.0 lb

## 2020-01-08 DIAGNOSIS — M75122 Complete rotator cuff tear or rupture of left shoulder, not specified as traumatic: Secondary | ICD-10-CM | POA: Diagnosis present

## 2020-01-08 MED ORDER — METHYLPREDNISOLONE ACETATE 80 MG/ML IJ SUSP
80.0000 mg | Freq: Once | INTRAMUSCULAR | Status: AC
  Administered 2020-01-08: 80 mg via INTRAMUSCULAR

## 2020-01-08 NOTE — Patient Instructions (Addendum)
We are going to refer you to Dr. Griffin Basil to discuss your left shoulder rotator cuff tear.  [place], [place]  Appt: 01/10/20 @ 3:15 pm. Please arrive at 3 pm.

## 2020-01-09 ENCOUNTER — Ambulatory Visit: Payer: Medicaid Other

## 2020-01-09 NOTE — Progress Notes (Signed)
   Subjective:    Patient ID: Tonya Mckenzie, female    DOB: [birth year], 59 y.o.   MRN: [id number]  HPI   Patient presents today with persistent left shoulder pain.  She was first seen in the office on July 7.  Ultrasound at that time showed no obvious rotator cuff tear so patient received a subacromial cortisone injection.  She did note transient improvement after the injection and return to the office a few weeks later for a repeat injection.  Once again, she had a brief period of time where her pain had resolved.  But unfortunately, it eventually returned.  At that time an MRI was ordered.  The MRI shows evidence of a full-thickness full width supraspinatus tendon tear with significant retraction and moderate muscle atrophy.  She also has a full-thickness full width infraspinatus tendon tear with significant retraction and moderate muscle atrophy.  She does not recall any traumatic event.  Her pain all started one day after doing approximately 60 Lat pull downs in the gym.  No prior shoulder surgeries.    Review of Systems As above    Objective:   Physical Exam  Well-developed, well-nourished.  Sitting comfortably in exam room  Left shoulder: Patient has limited active range of motion in all planes limited somewhat due to pain.  She does demonstrate 4/5 strength with resisted supraspinatus and external rotation but this causes severe pain.  No tenderness over the biceps tendon.  Neurovascularly intact distally.  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  I am going to refer this patient to Dr. Griffin Basil to discuss surgical options.  I will defer further work-up and treatment to the discretion of Dr. Griffin Basil and the patient will follow up with me as needed.

## 2020-01-16 ENCOUNTER — Ambulatory Visit (INDEPENDENT_AMBULATORY_CARE_PROVIDER_SITE_OTHER): Payer: Medicaid Other

## 2020-01-16 ENCOUNTER — Other Ambulatory Visit: Payer: Self-pay

## 2020-01-16 DIAGNOSIS — Z23 Encounter for immunization: Secondary | ICD-10-CM | POA: Diagnosis present

## 2020-01-16 NOTE — Progress Notes (Signed)
   Covid-19 Vaccination Clinic  Name:  Tonya Mckenzie    MRN: [id number] DOB: 1960-05-26  01/16/2020  [name] was observed post Covid-19 immunization for 15 minutes without incident. She was provided with Vaccine Information Sheet and instruction to access the V-Safe system.   [name] was instructed to call 911 with any severe reactions post vaccine: Marland Kitchen Difficulty breathing  . Swelling of face and throat  . A fast heartbeat  . A bad rash all over body  . Dizziness and weakness   Booster administered RD without complication.

## 2020-01-23 ENCOUNTER — Telehealth: Payer: Self-pay

## 2020-01-23 NOTE — Telephone Encounter (Signed)
Patient calls nurse line regarding 3-4 red, itchy spots on back. Patient was calling due to concerns for allergic reaction to COVID vaccine booster given on 11/17. After discussing onset with patient, she believes this actually occurred before vaccination, as she was doing yard work. Patient reports that they look like mosquito bites.   Patient reports that she will take benadryl to help with symptoms and return call to office Monday if symptoms are persistent/ worsening.   Strict return precautions given.   Talbot Grumbling, RN

## 2020-01-23 NOTE — Telephone Encounter (Signed)
Agree with plan 

## 2020-02-27 ENCOUNTER — Encounter (HOSPITAL_BASED_OUTPATIENT_CLINIC_OR_DEPARTMENT_OTHER): Payer: Self-pay | Admitting: Orthopaedic Surgery

## 2020-02-27 ENCOUNTER — Other Ambulatory Visit: Payer: Self-pay

## 2020-03-03 ENCOUNTER — Other Ambulatory Visit (HOSPITAL_COMMUNITY)
Admission: RE | Admit: 2020-03-03 | Discharge: 2020-03-03 | Disposition: A | Discharge status: Home / Self Care | Payer: Medicaid Other | Source: Ambulatory Visit | Attending: Orthopaedic Surgery | Admitting: Orthopaedic Surgery

## 2020-03-03 ENCOUNTER — Encounter (HOSPITAL_BASED_OUTPATIENT_CLINIC_OR_DEPARTMENT_OTHER)
Admission: RE | Admit: 2020-03-03 | Discharge: 2020-03-03 | Disposition: A | Discharge status: Home / Self Care | Payer: Medicaid Other | Source: Ambulatory Visit | Attending: Orthopaedic Surgery | Admitting: Orthopaedic Surgery

## 2020-03-03 DIAGNOSIS — Z01818 Encounter for other preprocedural examination: Secondary | ICD-10-CM | POA: Insufficient documentation

## 2020-03-03 DIAGNOSIS — Z20822 Contact with and (suspected) exposure to covid-19: Secondary | ICD-10-CM | POA: Insufficient documentation

## 2020-03-03 LAB — COMPREHENSIVE METABOLIC PANEL
ALT: 111 U/L — ABNORMAL HIGH (ref 0–44)
AST: 59 U/L — ABNORMAL HIGH (ref 15–41)
Albumin: 3.5 g/dL (ref 3.5–5.0)
Alkaline Phosphatase: 85 U/L (ref 38–126)
Anion gap: 9 (ref 5–15)
BUN: 16 mg/dL (ref 6–20)
CO2: 30 mmol/L (ref 22–32)
Calcium: 8.9 mg/dL (ref 8.9–10.3)
Chloride: 104 mmol/L (ref 98–111)
Creatinine, Ser: 1.17 mg/dL — ABNORMAL HIGH (ref 0.44–1.00)
GFR, Estimated: 54 mL/min — ABNORMAL LOW (ref >60)
Glucose, Bld: 71 mg/dL (ref 70–99)
Potassium: 4.2 mmol/L (ref 3.5–5.1)
Sodium: 143 mmol/L (ref 135–145)
Total Bilirubin: 0.5 mg/dL (ref 0.3–1.2)
Total Protein: 5.9 g/dL — ABNORMAL LOW (ref 6.5–8.1)

## 2020-03-03 LAB — RAPID URINE DRUG SCREEN, HOSP PERFORMED
Amphetamines: NOT DETECTED
Barbiturates: NOT DETECTED
Benzodiazepines: NOT DETECTED
Cocaine: NOT DETECTED
Opiates: NOT DETECTED
Tetrahydrocannabinol: POSITIVE — AB

## 2020-03-03 NOTE — Progress Notes (Signed)

## 2020-03-03 NOTE — H&P (Signed)
PREOPERATIVE H&P  Chief Complaint: LEFT SHOULDER CARTILAGE DISORDER, PRIMARY OSTEOARTHRITIS IMPINGEMENT AND STRAIN OF ROTATOR CUFF  HPI: Tonya Mckenzie is a 60 y.o. female who is scheduled for, Procedure(s): SHOULDER ARTHROSCOPY WITH DEBRIDEMENT AND BICEP TENODESIS SHOULDER ARTHROSCOPY WITH ROTATOR CUFF REPAIR AND SUBACROMIAL DECOMPRESSION PARTIAL ACROMIOPLASTY AND DISTAL CLAVICULECTOMY.   Patient has a past medical history significant for HTN, GERD.   The patient is a 59 year old who was working out on a new machine at [employer] and had immediate pain in her left shoulder.  She was unable to lift her arm afterwards.  She has instability symptoms.  She is right handed at baseline.  She works as a self-employed Patent examiner.    Her symptoms are rated as moderate to severe, and have been worsening.  This is significantly impairing activities of daily living.    Please see clinic note for further details on this patient's care.    She has elected for surgical management.   Past Medical History:  Diagnosis Date  . Allergic rhinitis   . Anxiety   . Bacterial vaginosis   . Chronic post-traumatic stress disorder (PTSD)   . Cocaine abuse ([facility])   . Depression   . ETOH abuse   . GERD (gastroesophageal reflux disease)   . Hyperlipidemia   . Hypertension    off meds due to weight loss  . Marijuana abuse   . Uterine leiomyoma    Past Surgical History:  Procedure Laterality Date  . ABDOMINAL HYSTERECTOMY     left ovaries, took cervix.  Performed for heavy periods and anemia  . CHOLECYSTECTOMY    . thumb surgery     right  . TOE SURGERY Left   . TONSILLECTOMY    . TYMPANOSTOMY TUBE PLACEMENT     Social History   Socioeconomic History  . Marital status: Married    Spouse name: Tyrone  . Number of children: 4  . Years of education: Not on file  . Highest education level: Not on file  Occupational History  . Occupation: student    Comment: Publishing copy  Tobacco  Use  . Smoking status: Former Smoker    Types: Cigarettes  . Smokeless tobacco: Never Used  . Tobacco comment: never really smoked, when she did was about 1 cigarette a month  Vaping Use  . Vaping Use: Never used  Substance and Sexual Activity  . Alcohol use: Not Currently    Comment: quit drinking 2016  . Drug use: Not Currently    Types: Cocaine, Marijuana    Comment: reformed went through rehab  . Sexual activity: Not on file  Other Topics Concern  . Not on file  Social History Narrative  . Not on file   Social Determinants of Health   Financial Resource Strain: Not on file  Food Insecurity: Not on file  Transportation Needs: Not on file  Physical Activity: Not on file  Stress: Not on file  Social Connections: Not on file   Family History  Problem Relation Age of Onset  . Diabetes Mother   . Hypertension Mother   . Hypercholesterolemia Mother   . Heart attack Father   . Heart attack Brother   . Colon cancer Neg Hx   . Esophageal cancer Neg Hx   . Rectal cancer Neg Hx   . Stomach cancer Neg Hx   . Liver cancer Neg Hx   . Breast cancer Neg Hx    No Known Allergies Prior to  Admission medications   Medication Sig Start Date End Date Taking? Authorizing Provider  amLODipine (NORVASC) 5 MG tablet Take 1 tablet (5 mg total) by mouth at bedtime. Start taking when you decrease your clonidine to once a day. 08/23/19  Yes Meccariello, Bernita Raisin, DO  ezetimibe (ZETIA) 10 MG tablet Take 1 tablet (10 mg total) by mouth daily. 08/02/19  Yes Meccariello, Bernita Raisin, DO  Multiple Vitamins-Minerals (WOMENS MULTIVITAMIN PO) Take by mouth.   Yes [provider]  rosuvastatin (CRESTOR) 40 MG tablet Take 1 tablet (40 mg total) by mouth daily. 08/02/19  Yes Meccariello, Bernita Raisin, DO  HYDROcodone-acetaminophen (NORCO) 7.5-325 MG tablet Take 1 tablet by mouth every 6 (six) hours as needed for moderate pain. 11/19/19   Hudnall, Sharyn Lull, MD  ibuprofen (ADVIL) 400 MG tablet Take 1 tablet  (400 mg total) by mouth every 6 (six) hours as needed. 09/05/19   Hudnall, Sharyn Lull, MD  Vitamin D, Ergocalciferol, (DRISDOL) 1.25 MG (50000 UT) CAPS capsule TK 1 C PO ONCE A WEEK 08/25/18   [provider]    ROS: All other systems have been reviewed and were otherwise negative with the exception of those mentioned in the HPI and as above.  Physical Exam: General: Alert, no acute distress Cardiovascular: No pedal edema Respiratory: No cyanosis, no use of accessory musculature GI: No organomegaly, abdomen is soft and non-tender Skin: No lesions in the area of chief complaint Neurologic: Sensation intact distally Psychiatric: Patient is competent for consent with normal mood and affect Lymphatic: No axillary or cervical lymphadenopathy  MUSCULOSKELETAL:  Left shoulder demonstrates active forward elevation to 170, but she circumducts to try and get through range of motion.  She has positive drop arm test; 4/5 cuff strength throughout the remainder.  Negative AC tenderness to palpation; positive impingement, positive O'Brien's.    Imaging: MRI demonstrates a full thickness retracted cuff tear past the level of the humeral head.  Assessment: LEFT SHOULDER CARTILAGE DISORDER, PRIMARY OSTEOARTHRITIS IMPINGEMENT AND STRAIN OF ROTATOR CUFF  Plan: Plan for Procedure(s): SHOULDER ARTHROSCOPY WITH DEBRIDEMENT AND BICEP TENODESIS SHOULDER ARTHROSCOPY WITH ROTATOR CUFF REPAIR AND SUBACROMIAL DECOMPRESSION PARTIAL ACROMIOPLASTY AND DISTAL CLAVICULECTOMY  The risks benefits and alternatives were discussed with the patient including but not limited to the risks of nonoperative treatment, versus surgical intervention including infection, bleeding, nerve injury,  blood clots, cardiopulmonary complications, morbidity, mortality, among others, and they were willing to proceed.   The patient acknowledged the explanation, agreed to proceed with the plan and consent was signed.   Operative Plan: Left  shoulder scope with SAD, DCE, BT, RCR versus SCR Discharge Medications: Tylenol 650, Celebrex, Oxycodone, Zofran DVT Prophylaxis: None Physical Therapy: Outpatient PT Special Discharge needs: [place], PA-C  03/03/2020 12:39 PM

## 2020-03-03 NOTE — Progress Notes (Signed)
Labs results reviewed by Dr. Chilton Si, will proceed with surgery as scheduled.

## 2020-03-04 LAB — SARS CORONAVIRUS 2 (TAT 6-24 HRS): SARS Coronavirus 2: NEGATIVE

## 2020-03-06 ENCOUNTER — Other Ambulatory Visit: Payer: Self-pay

## 2020-03-06 ENCOUNTER — Ambulatory Visit (HOSPITAL_BASED_OUTPATIENT_CLINIC_OR_DEPARTMENT_OTHER)
Admission: RE | Admit: 2020-03-06 | Discharge: 2020-03-06 | Disposition: A | Discharge status: Home / Self Care | Payer: Medicaid Other | Attending: Orthopaedic Surgery | Admitting: Orthopaedic Surgery

## 2020-03-06 ENCOUNTER — Encounter (HOSPITAL_BASED_OUTPATIENT_CLINIC_OR_DEPARTMENT_OTHER)
Admission: RE | Disposition: A | Discharge status: Home / Self Care | Payer: Self-pay | Source: Home / Self Care | Attending: Orthopaedic Surgery

## 2020-03-06 ENCOUNTER — Ambulatory Visit (HOSPITAL_BASED_OUTPATIENT_CLINIC_OR_DEPARTMENT_OTHER): Payer: Medicaid Other | Admitting: Anesthesiology

## 2020-03-06 ENCOUNTER — Encounter (HOSPITAL_BASED_OUTPATIENT_CLINIC_OR_DEPARTMENT_OTHER): Payer: Self-pay | Admitting: Orthopaedic Surgery

## 2020-03-06 DIAGNOSIS — Z9049 Acquired absence of other specified parts of digestive tract: Secondary | ICD-10-CM | POA: Diagnosis not present

## 2020-03-06 DIAGNOSIS — M75102 Unspecified rotator cuff tear or rupture of left shoulder, not specified as traumatic: Secondary | ICD-10-CM | POA: Insufficient documentation

## 2020-03-06 DIAGNOSIS — Z87891 Personal history of nicotine dependence: Secondary | ICD-10-CM | POA: Insufficient documentation

## 2020-03-06 DIAGNOSIS — M19012 Primary osteoarthritis, left shoulder: Secondary | ICD-10-CM | POA: Insufficient documentation

## 2020-03-06 DIAGNOSIS — Z79899 Other long term (current) drug therapy: Secondary | ICD-10-CM | POA: Diagnosis not present

## 2020-03-06 HISTORY — PX: SHOULDER ARTHROSCOPY WITH ROTATOR CUFF REPAIR AND SUBACROMIAL DECOMPRESSION: SHX5686

## 2020-03-06 HISTORY — PX: BICEPT TENODESIS: SHX5116

## 2020-03-06 SURGERY — SHOULDER ARTHROSCOPY WITH ROTATOR CUFF REPAIR AND SUBACROMIAL DECOMPRESSION
Anesthesia: General | Site: Shoulder | Laterality: Left

## 2020-03-06 MED ORDER — PROPOFOL 10 MG/ML IV BOLUS
INTRAVENOUS | Status: AC
  Filled 2020-03-06: qty 20

## 2020-03-06 MED ORDER — GABAPENTIN 100 MG PO CAPS: 100.0000 mg | ORAL_CAPSULE | Freq: Two times a day (BID) | ORAL | 0 refills | Status: DC

## 2020-03-06 MED ORDER — ONDANSETRON HCL 4 MG PO TABS: 4.0000 mg | ORAL_TABLET | Freq: Three times a day (TID) | ORAL | 1 refills | Status: AC | PRN

## 2020-03-06 MED ORDER — ONDANSETRON HCL 4 MG/2ML IJ SOLN
INTRAMUSCULAR | Status: DC | PRN
  Administered 2020-03-06: 4 mg via INTRAVENOUS

## 2020-03-06 MED ORDER — GLYCOPYRROLATE 0.2 MG/ML IJ SOLN
INTRAMUSCULAR | Status: DC | PRN
  Administered 2020-03-06: .2 mg via INTRAVENOUS

## 2020-03-06 MED ORDER — DEXAMETHASONE SODIUM PHOSPHATE 4 MG/ML IJ SOLN
INTRAMUSCULAR | Status: DC | PRN
  Administered 2020-03-06: 5 mg via INTRAVENOUS

## 2020-03-06 MED ORDER — CEFAZOLIN SODIUM-DEXTROSE 2-4 GM/100ML-% IV SOLN
INTRAVENOUS | Status: AC
  Filled 2020-03-06: qty 100

## 2020-03-06 MED ORDER — BUPIVACAINE HCL (PF) 0.5 % IJ SOLN
INTRAMUSCULAR | Status: DC | PRN
  Administered 2020-03-06: 15 mL via PERINEURAL

## 2020-03-06 MED ORDER — MIDAZOLAM HCL 2 MG/2ML IJ SOLN
INTRAMUSCULAR | Status: AC
  Filled 2020-03-06: qty 2

## 2020-03-06 MED ORDER — LIDOCAINE 2% (20 MG/ML) 5 ML SYRINGE
INTRAMUSCULAR | Status: AC
  Filled 2020-03-06: qty 5

## 2020-03-06 MED ORDER — ACETAMINOPHEN 160 MG/5ML PO SOLN: 325.0000 mg | ORAL | Status: DC | PRN

## 2020-03-06 MED ORDER — MIDAZOLAM HCL 2 MG/2ML IJ SOLN
2.0000 mg | Freq: Once | INTRAMUSCULAR | Status: AC
  Administered 2020-03-06: 2 mg via INTRAVENOUS

## 2020-03-06 MED ORDER — PHENYLEPHRINE HCL (PRESSORS) 10 MG/ML IV SOLN
INTRAVENOUS | Status: DC | PRN
  Administered 2020-03-06: 40 ug via INTRAVENOUS
  Administered 2020-03-06: 120 ug via INTRAVENOUS
  Administered 2020-03-06: 80 ug via INTRAVENOUS
  Administered 2020-03-06: 120 ug via INTRAVENOUS
  Administered 2020-03-06: 80 ug via INTRAVENOUS
  Administered 2020-03-06: 120 ug via INTRAVENOUS
  Administered 2020-03-06: 40 ug via INTRAVENOUS
  Administered 2020-03-06 (×4): 120 ug via INTRAVENOUS

## 2020-03-06 MED ORDER — PHENYLEPHRINE 40 MCG/ML (10ML) SYRINGE FOR IV PUSH (FOR BLOOD PRESSURE SUPPORT)
PREFILLED_SYRINGE | INTRAVENOUS | Status: AC
  Filled 2020-03-06: qty 10

## 2020-03-06 MED ORDER — FENTANYL CITRATE (PF) 100 MCG/2ML IJ SOLN
25.0000 ug | INTRAMUSCULAR | Status: DC | PRN
Start: 2020-03-06 — End: 2020-03-06

## 2020-03-06 MED ORDER — ACETAMINOPHEN 325 MG PO TABS: 325.0000 mg | ORAL_TABLET | ORAL | Status: DC | PRN

## 2020-03-06 MED ORDER — OXYCODONE HCL 5 MG PO TABS: ORAL_TABLET | ORAL | 0 refills | Status: AC

## 2020-03-06 MED ORDER — SUGAMMADEX SODIUM 200 MG/2ML IV SOLN
INTRAVENOUS | Status: DC | PRN
Start: 2020-03-06 — End: 2020-03-06
  Administered 2020-03-06: 200 mg via INTRAVENOUS

## 2020-03-06 MED ORDER — EPHEDRINE SULFATE 50 MG/ML IJ SOLN
INTRAMUSCULAR | Status: DC | PRN
  Administered 2020-03-06: 10 mg via INTRAVENOUS

## 2020-03-06 MED ORDER — FENTANYL CITRATE (PF) 100 MCG/2ML IJ SOLN
INTRAMUSCULAR | Status: AC
  Filled 2020-03-06: qty 2

## 2020-03-06 MED ORDER — EPHEDRINE 5 MG/ML INJ
INTRAVENOUS | Status: AC
  Filled 2020-03-06: qty 30

## 2020-03-06 MED ORDER — OXYCODONE HCL 5 MG/5ML PO SOLN: 5.0000 mg | Freq: Once | ORAL | Status: DC | PRN

## 2020-03-06 MED ORDER — PROPOFOL 10 MG/ML IV BOLUS
INTRAVENOUS | Status: DC | PRN
  Administered 2020-03-06: 150 mg via INTRAVENOUS
  Administered 2020-03-06: 50 mg via INTRAVENOUS

## 2020-03-06 MED ORDER — BUPIVACAINE LIPOSOME 1.3 % IJ SUSP
INTRAMUSCULAR | Status: DC | PRN
  Administered 2020-03-06: 10 mL via PERINEURAL

## 2020-03-06 MED ORDER — MEPERIDINE HCL 25 MG/ML IJ SOLN: 6.2500 mg | INTRAMUSCULAR | Status: DC | PRN

## 2020-03-06 MED ORDER — CELECOXIB 100 MG PO CAPS: 100.0000 mg | ORAL_CAPSULE | Freq: Two times a day (BID) | ORAL | 0 refills | Status: AC

## 2020-03-06 MED ORDER — OXYCODONE HCL 5 MG PO TABS: 5.0000 mg | ORAL_TABLET | Freq: Once | ORAL | Status: DC | PRN

## 2020-03-06 MED ORDER — CEFAZOLIN SODIUM-DEXTROSE 2-4 GM/100ML-% IV SOLN
2.0000 g | INTRAVENOUS | Status: AC
  Administered 2020-03-06: 1 g via INTRAVENOUS

## 2020-03-06 MED ORDER — ONDANSETRON HCL 4 MG/2ML IJ SOLN: 4.0000 mg | Freq: Once | INTRAMUSCULAR | Status: DC | PRN

## 2020-03-06 MED ORDER — ACETAMINOPHEN 325 MG PO TABS: 325.0000 mg | ORAL_TABLET | Freq: Three times a day (TID) | ORAL | 0 refills | Status: AC

## 2020-03-06 MED ORDER — ROCURONIUM BROMIDE 100 MG/10ML IV SOLN
INTRAVENOUS | Status: DC | PRN
  Administered 2020-03-06: 50 mg via INTRAVENOUS

## 2020-03-06 MED ORDER — DEXAMETHASONE SODIUM PHOSPHATE 10 MG/ML IJ SOLN
INTRAMUSCULAR | Status: AC
  Filled 2020-03-06: qty 1

## 2020-03-06 MED ORDER — FENTANYL CITRATE (PF) 100 MCG/2ML IJ SOLN
100.0000 ug | Freq: Once | INTRAMUSCULAR | Status: AC
  Administered 2020-03-06: 100 ug via INTRAVENOUS

## 2020-03-06 MED ORDER — SODIUM CHLORIDE 0.9 % IR SOLN: Status: DC | PRN

## 2020-03-06 MED ORDER — LACTATED RINGERS IV SOLN: INTRAVENOUS | Status: DC

## 2020-03-06 MED ORDER — ONDANSETRON HCL 4 MG/2ML IJ SOLN
INTRAMUSCULAR | Status: AC
  Filled 2020-03-06: qty 2

## 2020-03-06 SURGICAL SUPPLY — 65 items
AID PSTN UNV HD RSTRNT DISP (MISCELLANEOUS) ×1
ANCH SUT SWLK 19.1X4.75 (Anchor) ×3 IMPLANT
ANCHOR SUT 1.8 FBRTK KNTLS 2SU (Anchor) ×2 IMPLANT
ANCHOR SUT BIO SW 4.75X19.1 (Anchor) ×3 IMPLANT
APL PRP STRL LF DISP 70% ISPRP (MISCELLANEOUS) ×1
BLADE EXCALIBUR 4.0X13 (MISCELLANEOUS) ×2 IMPLANT
BURR OVAL 8 FLU 4.0X13 (MISCELLANEOUS) IMPLANT
CANNULA 5.75X71 LONG (CANNULA) IMPLANT
CANNULA PASSPORT 5 (CANNULA) IMPLANT
CANNULA PASSPORT BUTTON 10-40 (CANNULA) ×1 IMPLANT
CANNULA TWIST IN 8.25X7CM (CANNULA) IMPLANT
CHLORAPREP W/TINT 26 (MISCELLANEOUS) ×2 IMPLANT
CLSR STERI-STRIP ANTIMIC 1/2X4 (GAUZE/BANDAGES/DRESSINGS) ×2 IMPLANT
COOLER ICEMAN CLASSIC (MISCELLANEOUS) ×2 IMPLANT
COVER WAND RF STERILE (DRAPES) IMPLANT
DRAPE IMP U-DRAPE 54X76 (DRAPES) ×2 IMPLANT
DRAPE INCISE IOBAN 66X45 STRL (DRAPES) IMPLANT
DRAPE SHOULDER BEACH CHAIR (DRAPES) ×2 IMPLANT
DRSG PAD ABDOMINAL 8X10 ST (GAUZE/BANDAGES/DRESSINGS) ×2 IMPLANT
DW OUTFLOW CASSETTE/TUBE SET (MISCELLANEOUS) ×2 IMPLANT
GAUZE SPONGE 4X4 12PLY STRL (GAUZE/BANDAGES/DRESSINGS) ×2 IMPLANT
GLOVE BIOGEL PI IND STRL 7.0 (GLOVE) IMPLANT
GLOVE BIOGEL PI INDICATOR 7.0 (GLOVE) ×2
GLOVE ECLIPSE 6.5 STRL STRAW (GLOVE) ×1 IMPLANT
GLOVE ECLIPSE 8.0 STRL XLNG CF (GLOVE) ×2 IMPLANT
GLOVE SRG 8 PF TXTR STRL LF DI (GLOVE) ×1 IMPLANT
GLOVE SURG ENC MOIS LTX SZ6.5 (GLOVE) ×2 IMPLANT
GLOVE SURG UNDER POLY LF SZ6.5 (GLOVE) ×2 IMPLANT
GLOVE SURG UNDER POLY LF SZ8 (GLOVE) ×2
GOWN STRL REUS W/ TWL LRG LVL3 (GOWN DISPOSABLE) ×2 IMPLANT
GOWN STRL REUS W/TWL LRG LVL3 (GOWN DISPOSABLE) ×4
GOWN STRL REUS W/TWL XL LVL3 (GOWN DISPOSABLE) ×2 IMPLANT
IMPL SPEEDBRIDGE KIT (Orthopedic Implant) IMPLANT
IMPLANT SPEEDBRIDGE KIT (Orthopedic Implant) ×2 IMPLANT
KIT STABILIZATION SHOULDER (MISCELLANEOUS) ×2 IMPLANT
KIT STR SPEAR 1.8 FBRTK DISP (KITS) ×2 IMPLANT
LASSO 90 CVE QUICKPAS (DISPOSABLE) IMPLANT
LASSO CRESCENT QUICKPASS (SUTURE) IMPLANT
MANIFOLD NEPTUNE II (INSTRUMENTS) ×2 IMPLANT
NDL SAFETY ECLIPSE 18X1.5 (NEEDLE) ×1 IMPLANT
NDL SCORPION MULTI FIRE (NEEDLE) IMPLANT
NEEDLE HYPO 18GX1.5 SHARP (NEEDLE) ×2
NEEDLE SCORPION MULTI FIRE (NEEDLE) IMPLANT
PACK ARTHROSCOPY DSU (CUSTOM PROCEDURE TRAY) ×2 IMPLANT
PACK BASIN DAY SURGERY FS (CUSTOM PROCEDURE TRAY) ×2 IMPLANT
PAD COLD SHLDR WRAP-ON (PAD) ×2 IMPLANT
PAD ORTHO SHOULDER 7X19 LRG (SOFTGOODS) ×3 IMPLANT
PORT APPOLLO RF 90DEGREE MULTI (SURGICAL WAND) ×2 IMPLANT
RESTRAINT HEAD UNIVERSAL NS (MISCELLANEOUS) ×2 IMPLANT
SHEET MEDIUM DRAPE 40X70 STRL (DRAPES) ×2 IMPLANT
SLEEVE SCD COMPRESS KNEE MED (MISCELLANEOUS) ×2 IMPLANT
SLING ARM FOAM STRAP LRG (SOFTGOODS) IMPLANT
SUT FIBERWIRE #2 38 T-5 BLUE (SUTURE)
SUT MNCRL AB 4-0 PS2 18 (SUTURE) ×2 IMPLANT
SUT PDS AB 1 CT  36 (SUTURE) ×2
SUT PDS AB 1 CT 36 (SUTURE) IMPLANT
SUT TIGER TAPE 7 IN WHITE (SUTURE) ×1 IMPLANT
SUTURE FIBERWR #2 38 T-5 BLUE (SUTURE) IMPLANT
SUTURE TAPE TIGERLINK 1.3MM BL (SUTURE) IMPLANT
SUTURETAPE TIGERLINK 1.3MM BL (SUTURE) ×2
SYR 5ML LL (SYRINGE) ×2 IMPLANT
TAPE FIBER 2MM 7IN #2 BLUE (SUTURE) ×1 IMPLANT
TOWEL GREEN STERILE FF (TOWEL DISPOSABLE) ×4 IMPLANT
TUBE CONNECTING 20X1/4 (TUBING) ×2 IMPLANT
TUBING ARTHROSCOPY IRRIG 16FT (MISCELLANEOUS) ×2 IMPLANT

## 2020-03-06 NOTE — Anesthesia Procedure Notes (Signed)
Anesthesia Regional Block: Interscalene brachial plexus block   Pre-Anesthetic Checklist: ,, timeout performed, Correct Patient, Correct Site, Correct Laterality, Correct Procedure, Correct Position, site marked, Risks and benefits discussed,  Surgical consent,  Pre-op evaluation,  At surgeon's request and post-op pain management  Laterality: Left  Prep: chloraprep       Needles:  Injection technique: Single-shot  Needle Type: Echogenic Stimulator Needle     Needle Length: 5cm  Needle Gauge: 22     Additional Needles:   Procedures:, nerve stimulator,,, ultrasound used (permanent image in chart),,,,   Nerve Stimulator or Paresthesia:  Response: hand, 0.45 mA,   Additional Responses:   Narrative:  Start time: 03/06/2020 8:15 AM End time: 03/06/2020 8:20 AM Injection made incrementally with aspirations every 5 mL.  Performed by: Personally  Anesthesiologist: Bethena Midget, MD  Additional Notes: Functioning IV was confirmed and monitors were applied.  A 79mm 22ga Arrow echogenic stimulator needle was used. Sterile prep and drape,hand hygiene and sterile gloves were used. Ultrasound guidance: relevant anatomy identified, needle position confirmed, local anesthetic spread visualized around nerve(s)., vascular puncture avoided.  Image printed for medical record. Negative aspiration and negative test dose prior to incremental administration of local anesthetic. The patient tolerated the procedure well.

## 2020-03-06 NOTE — Anesthesia Postprocedure Evaluation (Signed)
Anesthesia Post Note  Patient: Tonya Mckenzie  Procedure(s) Performed: SHOULDER ARTHROSCOPY WITH ROTATOR CUFF REPAIR AND SUBACROMIAL DECOMPRESSION PARTIAL ACROMIOPLASTY AND DISTAL CLAVICULECTOMY (Left Shoulder) BICEPS TENODESIS (Left Shoulder)     Patient location during evaluation: PACU Anesthesia Type: General Level of consciousness: awake and alert Pain management: pain level controlled Vital Signs Assessment: post-procedure vital signs reviewed and stable Respiratory status: spontaneous breathing, nonlabored ventilation, respiratory function stable and patient connected to nasal cannula oxygen Cardiovascular status: blood pressure returned to baseline and stable Postop Assessment: no apparent nausea or vomiting Anesthetic complications: no   No complications documented.  Last Vitals:  Vitals:   03/06/20 1130 03/06/20 1154  BP: 127/87 (!) 129/95  Pulse: 84 82  Resp: 16 16  Temp:  36.4 C  SpO2: 95% 95%    Last Pain:  Vitals:   03/06/20 1154  TempSrc:   PainSc: 0-No pain                 Abrial Arrighi

## 2020-03-06 NOTE — Discharge Instructions (Signed)
Post Anesthesia Home Care Instructions  Activity: Get plenty of rest for the remainder of the day. A responsible individual must stay with you for 24 hours following the procedure.  For the next 24 hours, DO NOT: -Drive a car -Operate machinery -Drink alcoholic beverages -Take any medication unless instructed by your physician -Make any legal decisions or sign important papers.  Meals: Start with liquid foods such as gelatin or soup. Progress to regular foods as tolerated. Avoid greasy, spicy, heavy foods. If nausea and/or vomiting occur, drink only clear liquids until the nausea and/or vomiting subsides. Call your physician if vomiting continues.  Special Instructions/Symptoms: Your throat may feel dry or sore from the anesthesia or the breathing tube placed in your throat during surgery. If this causes discomfort, gargle with warm salt water. The discomfort should disappear within 24 hours.  If you had a scopolamine patch placed behind your ear for the management of post- operative nausea and/or vomiting:  1. The medication in the patch is effective for 72 hours, after which it should be removed.  Wrap patch in a tissue and discard in the trash. Wash hands thoroughly with soap and water. 2. You may remove the patch earlier than 72 hours if you experience unpleasant side effects which may include dry mouth, dizziness or visual disturbances. 3. Avoid touching the patch. Wash your hands with soap and water after contact with the patch.      Regional Anesthesia Blocks  1. Numbness or the inability to move the "blocked" extremity may last from 3-48 hours after placement. The length of time depends on the medication injected and your individual response to the medication. If the numbness is not going away after 48 hours, call your surgeon.  2. The extremity that is blocked will need to be protected until the numbness is gone and the  Strength has returned. Because you cannot feel it, you  will need to take extra care to avoid injury. Because it may be weak, you may have difficulty moving it or using it. You may not know what position it is in without looking at it while the block is in effect.  3. For blocks in the legs and feet, returning to weight bearing and walking needs to be done carefully. You will need to wait until the numbness is entirely gone and the strength has returned. You should be able to move your leg and foot normally before you try and bear weight or walk. You will need someone to be with you when you first try to ensure you do not fall and possibly risk injury.  4. Bruising and tenderness at the needle site are common side effects and will resolve in a few days.  5. Persistent numbness or new problems with movement should be communicated to the surgeon or the [facility] ([phone])/ [facility] ([phone]).  Information for Discharge Teaching: EXPAREL (bupivacaine liposome injectable suspension)   Your surgeon or anesthesiologist gave you EXPAREL(bupivacaine) to help control your pain after surgery.   EXPAREL is a local anesthetic that provides pain relief by numbing the tissue around the surgical site.  EXPAREL is designed to release pain medication over time and can control pain for up to 72 hours.  Depending on how you respond to EXPAREL, you may require less pain medication during your recovery.  Possible side effects:  Temporary loss of sensation or ability to move in the area where bupivacaine was injected.  Nausea, vomiting, constipation  Rarely,   numbness and tingling in your mouth or lips, lightheadedness, or anxiety may occur.  Call your doctor right away if you think you may be experiencing any of these sensations, or if you have other questions regarding possible side effects.  Follow all other discharge instructions given to you by your surgeon or nurse. Eat a healthy diet and drink plenty of water or other  fluids.  If you return to the hospital for any reason within 96 hours following the administration of EXPAREL, it is important for health care providers to know that you have received this anesthetic. A teal colored band has been placed on your arm with the date, time and amount of EXPAREL you have received in order to alert and inform your health care providers. Please leave this armband in place for the full 96 hours following administration, and then you may remove the band. 

## 2020-03-06 NOTE — Progress Notes (Signed)
Assisted Dr. Ambrose Pancoast with left, ultrasound guided, interscalene  block. Side rails up, monitors on throughout procedure. See vital signs in flow sheet. Tolerated Procedure well.

## 2020-03-06 NOTE — Anesthesia Procedure Notes (Signed)
Procedure Name: Intubation Performed by: Karen Kitchens, CRNA Pre-anesthesia Checklist: Patient identified, Emergency Drugs available, Suction available and Patient being monitored Patient Re-evaluated:Patient Re-evaluated prior to induction Oxygen Delivery Method: Circle system utilized Preoxygenation: Pre-oxygenation with 100% oxygen Induction Type: IV induction Ventilation: Mask ventilation without difficulty Tube type: Oral Tube size: 7.0 mm Number of attempts: 1 Airway Equipment and Method: Stylet and Oral airway Placement Confirmation: ETT inserted through vocal cords under direct vision,  positive ETCO2,  breath sounds checked- equal and bilateral and CO2 detector Secured at: 23 cm Tube secured with: Tape Dental Injury: Teeth and Oropharynx as per pre-operative assessment

## 2020-03-06 NOTE — Transfer of Care (Signed)
Immediate Anesthesia Transfer of Care Note  Patient: Tonya Mckenzie  Procedure(s) Performed: SHOULDER ARTHROSCOPY WITH ROTATOR CUFF REPAIR AND SUBACROMIAL DECOMPRESSION PARTIAL ACROMIOPLASTY AND DISTAL CLAVICULECTOMY (Left Shoulder) BICEPS TENODESIS (Left Shoulder)  Patient Location: PACU  Anesthesia Type:General and Regional  Level of Consciousness: awake, alert  and oriented  Airway & Oxygen Therapy: Patient Spontanous Breathing and Patient connected to face mask oxygen  Post-op Assessment: Report given to RN and Post -op Vital signs reviewed and stable  Post vital signs: Reviewed and stable  Last Vitals:  Vitals Value Taken Time  BP 118/74 03/06/20 1100  Temp    Pulse 87 03/06/20 1100  Resp 17 03/06/20 1100  SpO2 100 % 03/06/20 1100  Vitals shown include unvalidated device data.  Last Pain:  Vitals:   03/06/20 0748  TempSrc: Oral  PainSc: 0-No pain      Patients Stated Pain Goal: 8 (03/06/20 0748)  Complications: No complications documented.

## 2020-03-06 NOTE — Anesthesia Procedure Notes (Signed)
Procedure Name: LMA Insertion Performed by: Margaretmary Prisk M, CRNA Pre-anesthesia Checklist: Patient identified, Emergency Drugs available, Suction available and Patient being monitored Patient Re-evaluated:Patient Re-evaluated prior to induction Oxygen Delivery Method: Circle system utilized Preoxygenation: Pre-oxygenation with 100% oxygen Induction Type: IV induction Ventilation: Mask ventilation without difficulty LMA: LMA inserted LMA Size: 4.0 Number of attempts: 1 Airway Equipment and Method: Bite block Placement Confirmation: positive ETCO2 Tube secured with: Tape Dental Injury: Teeth and Oropharynx as per pre-operative assessment        

## 2020-03-06 NOTE — Op Note (Signed)
Orthopaedic Surgery Operative Note (CSN: [id number])  Tonya Mckenzie  1960-10-30 Date of Surgery: 03/06/2020   Diagnoses:  Left shoulder massive rotator cuff tear, biceps tendinitis and impingement  Procedure: Arthroscopic extensive debridement Arthroscopic subacromial decompression Arthroscopic rotator cuff repair Arthroscopic biceps tenodesis   Operative Finding Exam under anesthesia: Full motion no limitation Articular space: No loose bodies, capsule intact, anterior labral fraying and superior labral fraying Chondral surfaces:Intact, no sign of chondral degeneration on the glenoid or humeral head Biceps: Redness and tearing of the biceps itself with a SLAP tear type II Subscapularis: Upper border fraying but no tearing of the anchor Superior Cuff: Patient had a massive nearly irreparable tearing occluding the supraspinatus and infraspinatus retracted to the level of the glenoid were able to medialize the tuberosity and get a repair and get a repair Bursal side: As above  Successful completion of the planned procedure.  Patient is extremely high risk of failure approaching 40% secondary to her massive tear.  Based on her clinical and social situation we felt that a superior capsular reconstruction was not in her best interest initially if she enjoys lifting weights and if we can get her own cuff tissue to heal that she has a better chance of long-term success.    We did an atypical 4 x 3 configuration with swivel locks to try and repair this massive tear.  We medialized the footprint and did multiple releases of the tissue.  We were able to get good apposition of the tissue to bone.  If she failed the surgery and required a superior capsular reconstruction we would consider putting our medial row anchors just lateral to the current medial row anchors as we occupied a significant amount of the space already.  The future lateral row anchors would have to go more distal to obtain  purchase.   Post-operative plan: The patient will be non-weightbearing in a sling for 6 weeks with no therapy to start until after that 6 weeks..  The patient will be discharged home.  DVT prophylaxis not indicated in ambulatory upper extremity patient without known risk factors.   Pain control with PRN pain medication preferring oral medicines.  Follow up plan will be scheduled in approximately 7 days for incision check and XR.  Post-Op Diagnosis: Same Surgeons:Primary: Hiram Gash, MD Assistants:Caroline McBane PA-C Location: [facility] ROOM 6 Anesthesia: General with Exparel interscalene block Antibiotics: Ancef 2 g Tourniquet time: None Estimated Blood Loss: Minimal Complications: None Specimens: None Implants: Implant Name Type Inv. Item Serial No. Manufacturer Lot No. LRB No. Used Action  IMPLANT SPEEDBRIDGE KIT - [id number] Orthopedic Implant IMPLANT SPEEDBRIDGE KIT  [employer] [id number] Left 1 Implanted  ANCHOR SUT 1.8 FIBERTAK 2 SUT - [id number] Anchor ANCHOR SUT 1.8 FIBERTAK 2 SUT  [employer] [id number] Left 1 Implanted  ANCHOR SUT 1.8 FIBERTAK 2 SUT - [id number] Anchor ANCHOR SUT 1.8 FIBERTAK 2 SUT  [employer] [id number] Left 1 Implanted  ANCHOR SUT BIO SW 4.75X19.1 - [id number] Anchor ANCHOR SUT BIO SW 4.75X19.1  [employer] [id number] Left 1 Implanted  ANCHOR SUT BIO SW 4.75X19.1 - [id number] Anchor ANCHOR SUT BIO SW 4.75X19.1  [employer] [id number] Left 1 Implanted  ANCHOR SUT BIO SW 4.75X19.1 - [id number] Anchor ANCHOR SUT BIO SW 4.75X19.1  Rolinda Roan [id number] Left 1 Implanted    Indications for Surgery:   Tonya Mckenzie is a 60 y.o. female with continued shoulder pain refractory to nonoperative measures for extended period of time.  She had  a massive cuff tear with retraction past the glenoid and humeral head on some views.  The risks and benefits were explained at length including but not limited to continued pain, cuff failure, biceps tenodesis failure, stiffness, need for further  surgery and infection.   Procedure:   Patient was correctly identified in the preoperative holding area and operative site marked.  Patient brought to OR and positioned beachchair on an De Smet table ensuring that all bony prominences were padded and the head was in an appropriate location.  Anesthesia was induced and the operative shoulder was prepped and draped in the usual sterile fashion.  Timeout was called preincision.  A standard posterior viewing portal was made after localizing the portal with a spinal needle.  An anterior accessory portal was also made.  After clearing the articular space the camera was positioned in the subacromial space.  Findings above.    Extensive debridement was performed of the anterior interval tissue, labral fraying and the bursa.  Subacromial decompression: We made a lateral portal with spinal needle guidance. We then proceeded to debride bursal tissue extensively with a shaver and arthrocare device. At that point we continued to identify the borders of the acromion and identify the spur. We then carefully preserved the deltoid fascia and used a burr to convert the acromion to a Type 1 flat acromion without issue.  Biceps tenodesis: We marked the tendon and then performed a tenotomy and debridement of the stump in the articular space. We then identified the biceps tendon in its groove suprapec with the arthroscope in the lateral portal taking care to move from lateral to medial to avoid injury to the subscapularis. At that point we unroofed the tendon itself and mobilized it. An accessory anterior portal was made in line with the tendon and we grasped it from the anterior superior portal and worked from the accessory anterior portal. Two Fibertak 1.33m knotless anchors were placed in the groove and the tendon was secured in a luggage loop style fashion with a pass of the limb of suture through the tendon using a scorpion device to avoid pull-through.  Repair was  completed with good tension on the tendon.  Residual stump of the tendon was removed after being resected with a RF ablator.  Rotator cuff repair: Patient had a massive tear and we initially assessed to note whether we had repair options or not.  We decided that was able to be repaired.  We were able to pass a single fiber wire stitch to retract the cuff and used a elevator to perform releases posteriorly and anteriorly on both the bursal and articular side of the cuff.  Were able to get good releases and felt that with medialization of the tuberosity that would be able to repair the tissue.  We medialized the tuberosity and prepared the bony surface for healing with a bur.  We medialized less than 1 cm.  At that point the cuff tear was so massive that we felt that 4 medial anchors were necessary.  We placed 4 4.75 mm swivel lock anchors on the medial side of the tuberosity and loaded each with fiber tape.  We used the safety stitches from 2 of the 4 anchors and passed each of the sets of sutures through the cuff.  The safety stitches were tied and cut to provide initial reduction of the cuff.  Once we passed all of our sutures we cleared the lateral side of the humeral tuberosity and placed three 4.75  mm swivel locks creating a speed bridge type repair utilizing 8 limbs of fiber tape.  The cuff was well reduced and repaired at the end of the case.   The incisions were closed with absorbable monocryl and steri strips.  A sterile dressing was placed along with a sling. The patient was awoken from general anesthesia and taken to the PACU in stable condition without complication.   Noemi Chapel, PA-C, present and scrubbed throughout the case, critical for completion in a timely fashion, and for retraction, instrumentation, closure.

## 2020-03-06 NOTE — Interval H&P Note (Signed)
History and Physical Interval Note:  03/06/2020 8:34 AM  Tonya Mckenzie  has presented today for surgery, with the diagnosis of LEFT SHOULDER CARTILAGE DISORDER, PRIMARY OSTEOARTHRITIS IMPINGEMENT AND STRAIN OF ROTATOR CUFF.  The various methods of treatment have been discussed with the patient and family. After consideration of risks, benefits and other options for treatment, the patient has consented to  Procedure(s): SHOULDER ARTHROSCOPY WITH DEBRIDEMENT AND BICEP TENODESIS (Left) SHOULDER ARTHROSCOPY WITH ROTATOR CUFF REPAIR AND SUBACROMIAL DECOMPRESSION PARTIAL ACROMIOPLASTY AND DISTAL CLAVICULECTOMY (Left) as a surgical intervention.  The patient's history has been reviewed, patient examined, no change in status, stable for surgery.  I have reviewed the patient's chart and labs.  Questions were answered to the patient's satisfaction.     Bjorn Pippin

## 2020-03-06 NOTE — Anesthesia Preprocedure Evaluation (Signed)
Anesthesia Evaluation  Patient identified by MRN, date of birth, ID band Patient awake    Reviewed: Allergy & Precautions, H&P , NPO status , Patient's Chart, lab work & pertinent test results  Airway Mallampati: II  TM Distance: >3 FB Neck ROM: Full    Dental no notable dental hx. (+) Teeth Intact, Dental Advisory Given   Pulmonary neg pulmonary ROS, former smoker,    Pulmonary exam normal breath sounds clear to auscultation       Cardiovascular hypertension, Pt. on medications Normal cardiovascular exam Rhythm:Regular Rate:Normal     Neuro/Psych PSYCHIATRIC DISORDERS Anxiety Depression Schizophrenia  Neuromuscular disease    GI/Hepatic GERD  ,(+)     substance abuse  alcohol use, cocaine use and marijuana use,   Endo/Other  negative endocrine ROS  Renal/GU negative Renal ROS  negative genitourinary   Musculoskeletal negative musculoskeletal ROS (+)   Abdominal   Peds negative pediatric ROS (+)  Hematology negative hematology ROS (+)   Anesthesia Other Findings   Reproductive/Obstetrics negative OB ROS                             Anesthesia Physical Anesthesia Plan  ASA: III  Anesthesia Plan: General   Post-op Pain Management: GA combined w/ Regional for post-op pain   Induction:   PONV Risk Score and Plan: Ondansetron, Dexamethasone, Midazolam and Treatment may vary due to age or medical condition  Airway Management Planned: Oral ETT and LMA  Additional Equipment: None  Intra-op Plan:   Post-operative Plan: Extubation in OR  Informed Consent: I have reviewed the patients History and Physical, chart, labs and discussed the procedure including the risks, benefits and alternatives for the proposed anesthesia with the patient or authorized representative who has indicated his/her understanding and acceptance.     Dental advisory given  Plan Discussed with:  Anesthesiologist and CRNA  Anesthesia Plan Comments: (Discussed both nerve block for pain relief post-op and GA; including NV, sore throat, dental injury, and pulmonary complications)        Anesthesia Quick Evaluation

## 2020-03-07 ENCOUNTER — Encounter (HOSPITAL_BASED_OUTPATIENT_CLINIC_OR_DEPARTMENT_OTHER): Payer: Self-pay | Admitting: Orthopaedic Surgery

## 2020-03-10 ENCOUNTER — Other Ambulatory Visit: Payer: Self-pay | Admitting: Family Medicine

## 2020-03-10 DIAGNOSIS — Z1231 Encounter for screening mammogram for malignant neoplasm of breast: Secondary | ICD-10-CM

## 2020-03-12 ENCOUNTER — Telehealth: Payer: Self-pay | Admitting: Family Medicine

## 2020-03-12 ENCOUNTER — Other Ambulatory Visit: Payer: Self-pay | Admitting: Family Medicine

## 2020-03-12 DIAGNOSIS — B373 Candidiasis of vulva and vagina: Secondary | ICD-10-CM

## 2020-03-12 DIAGNOSIS — B3731 Acute candidiasis of vulva and vagina: Secondary | ICD-10-CM

## 2020-03-12 MED ORDER — FLUCONAZOLE 150 MG PO TABS: 150.0000 mg | ORAL_TABLET | Freq: Once | ORAL | 0 refills | Status: AC

## 2020-03-12 NOTE — Progress Notes (Signed)
Patient reports concern for yeast infection.  Diflucan x1 sent.  If no improvement, needs appointment.

## 2020-03-12 NOTE — Telephone Encounter (Signed)
Rx sent for Diflucan x1.  If no improvement, patient will need an appointment.

## 2020-03-12 NOTE — Telephone Encounter (Signed)
Patient had surgery last week and was put no pain meds, which she believes is starting to give her a yeast infection. Patient would like to know if something can be called in for this yeast infection. Please advise.

## 2020-04-24 ENCOUNTER — Other Ambulatory Visit: Payer: Self-pay

## 2020-04-24 ENCOUNTER — Ambulatory Visit
Admission: RE | Admit: 2020-04-24 | Discharge: 2020-04-24 | Disposition: A | Discharge status: Home / Self Care | Payer: Medicaid Other | Source: Ambulatory Visit | Attending: Family Medicine | Admitting: Family Medicine

## 2020-04-24 DIAGNOSIS — Z1231 Encounter for screening mammogram for malignant neoplasm of breast: Secondary | ICD-10-CM

## 2020-04-25 ENCOUNTER — Other Ambulatory Visit: Payer: Self-pay

## 2020-04-25 ENCOUNTER — Ambulatory Visit: Payer: Medicaid Other | Attending: Orthopaedic Surgery

## 2020-04-25 DIAGNOSIS — M25512 Pain in left shoulder: Secondary | ICD-10-CM | POA: Insufficient documentation

## 2020-04-25 DIAGNOSIS — M25612 Stiffness of left shoulder, not elsewhere classified: Secondary | ICD-10-CM | POA: Insufficient documentation

## 2020-04-25 DIAGNOSIS — M6281 Muscle weakness (generalized): Secondary | ICD-10-CM | POA: Insufficient documentation

## 2020-04-25 DIAGNOSIS — Z9889 Other specified postprocedural states: Secondary | ICD-10-CM | POA: Diagnosis present

## 2020-04-25 NOTE — Therapy (Signed)
[place], [place], [address] Phone: [phone]   Fax:  [phone]  Physical Therapy Evaluation  Patient Details  Name: Tonya Mckenzie MRN: [id number] Date of Birth: 1960/06/26 Referring Provider (PT): Ophelia Charter, MD   Encounter Date: 04/25/2020   PT End of Session - 04/25/20 1114    Visit Number 1    Number of Visits 28    Date for PT Re-Evaluation 08/08/20    Authorization Type Medicaid [place] Access    PT Start Time 2836    PT Stop Time 1115    PT Time Calculation (min) 35 min    Activity Tolerance Patient tolerated treatment well    Behavior During Therapy [facility] for tasks assessed/performed           Past Medical History:  Diagnosis Date  . Allergic rhinitis   . Anxiety   . Bacterial vaginosis   . Chronic post-traumatic stress disorder (PTSD)   . Cocaine abuse ([place])   . Depression   . ETOH abuse   . GERD (gastroesophageal reflux disease)   . Hyperlipidemia   . Hypertension    off meds due to weight loss  . Marijuana abuse   . Uterine leiomyoma     Past Surgical History:  Procedure Laterality Date  . ABDOMINAL HYSTERECTOMY     left ovaries, took cervix.  Performed for heavy periods and anemia  . BICEPT TENODESIS Left 03/06/2020   Procedure: BICEPS TENODESIS;  Surgeon: Hiram Gash, MD;  Location: [place];  Service: Orthopedics;  Laterality: Left;  . CHOLECYSTECTOMY    . SHOULDER ARTHROSCOPY WITH ROTATOR CUFF REPAIR AND SUBACROMIAL DECOMPRESSION Left 03/06/2020   Procedure: SHOULDER ARTHROSCOPY WITH ROTATOR CUFF REPAIR AND SUBACROMIAL DECOMPRESSION PARTIAL ACROMIOPLASTY AND DISTAL CLAVICULECTOMY;  Surgeon: Hiram Gash, MD;  Location: [place];  Service: Orthopedics;  Laterality: Left;  . thumb surgery     right  . TOE SURGERY Left   . TONSILLECTOMY    . TYMPANOSTOMY TUBE PLACEMENT      There were no vitals filed for this visit.    Subjective  Assessment - 04/25/20 1051    Subjective She reports LT shoulder she reports at gym and was using new device with pulling weight up and 3 days later couldn't lift arm due to pain and weakness.    Limitations House hold activities;Lifting    Patient Stated Goals For arm ot get total mobility like RT arm    Currently in Pain? Yes    Pain Score 1     Pain Location Shoulder    Pain Orientation Left;Anterior    Pain Descriptors / Indicators Aching;Sore    Pain Type Surgical pain    Pain Onset More than a month ago    Pain Frequency Intermittent    Aggravating Factors  moving arm    Pain Relieving Factors rest              [facility] PT Assessment - 04/25/20 0001      Assessment   Medical Diagnosis Lt RTC repair    Referring Provider (PT) Ophelia Charter, MD    Onset Date/Surgical Date 03/06/20    Hand Dominance Right    Next MD Visit 6 weeks    Prior Therapy No      Precautions   Precautions Shoulder    Type of Shoulder Precautions PROM for next week then AAROM. No lifting over 5 pounds per pt.  Restrictions   Weight Bearing Restrictions Yes    LUE Weight Bearing Non weight bearing      Balance Screen   Has the patient fallen in the past 6 months No      Prior Function   Vocation ;Part time employment    Vocation Requirements sits with individual  No physical requirements      Cognition   Overall Cognitive Status Within Functional Limits for tasks assessed      ROM / Strength   AROM / PROM / Strength AROM;PROM;Strength      AROM   AROM Assessment Site Shoulder    Right/Left Shoulder Right;Left    Right Shoulder Extension 65 Degrees    Right Shoulder Flexion 165 Degrees    Right Shoulder ABduction 170 Degrees    Right Shoulder Internal Rotation --   T7   Right Shoulder External Rotation 105 Degrees    Right Shoulder Horizontal ABduction 20 Degrees    Right Shoulder Horizontal  ADduction 105 Degrees      PROM   PROM Assessment Site Shoulder    Right/Left Shoulder  Left    Left Shoulder Extension 20 Degrees    Left Shoulder Flexion 90 Degrees    Left Shoulder ABduction 60 Degrees    Left Shoulder Internal Rotation 70 Degrees    Left Shoulder External Rotation 30 Degrees   at side     Strength   Overall Strength Comments Not tested Lt due to PROM only   RT WNL    Strength Assessment Site Shoulder                      Objective measurements completed on examination: See above findings.               PT Education - 04/25/20 1054    Education Details No use of Lt arm    Person(s) Educated Patient    Methods Explanation    Comprehension Verbalized understanding            PT Short Term Goals - 04/25/20 1128      PT SHORT TERM GOAL #1   Title She will be independent with initial HEP    Baseline no proram and in phase 1 of protocol    Time 2    Period Weeks    Status New      PT SHORT TERM GOAL #2   Title PROM will equal protocol limits of  flexion 140 degrees , abducion 60 degrees , ER 40 degrees at side.    Time 2    Period Weeks    Status New      PT SHORT TERM GOAL #3   Title Pain will remain in 1-3 level    Baseline varies but generally 3 or less    Time 2    Period Weeks    Status New             PT Long Term Goals - 04/25/20 1130      PT LONG TERM GOAL #1   Title She will be indpendent with all HEP issued    Baseline independent with initial HEP    Time 14    Period Weeks    Status New      PT LONG TERM GOAL #2   Title She will have 90% AROm compared to RT arm.    Baseline unable to perform AROM    Time 14    Period  Weeks    Status New      PT LONG TERM GOAL #3   Title She will have intermittant and only mild pain with full use of LT arm    Baseline No functional use of LT arm/shoulder    Time 14    Period Weeks    Status New      PT LONG TERM GOAL #4   Title She will return to normal use of LT arm for self care aand home tasks    Baseline Not able to perform selfcare or home  tasks normally with LT arm    Time 14    Period Weeks    Status New                  Plan - 04/25/20 1114    Clinical Impression Statement Tonya Mckenzie is doing well post RTC repair and decompression of left shoulder.  She is 7 weeks posty surgery but without any formal PT we will start her in the 1-6 week protocol and progress into phase 2  as ROM meets phase 1 limits. Her pain is well controlled so we need to be cautious to use pain as guide to  how we push her progress. She was cautioned to not do any lifting and not do any reaching  at this time above shoulder height and to use pain as guide  to stop use of LT arm.   Skilled PT and pt complience should produce a good outcome for Tonya Mckenzie.    Examination-Activity Limitations Bathing;Hygiene/Grooming;Lift;Reach Overhead;Carry;Dressing    Examination-Participation Restrictions Laundry;Cleaning;Community Activity;Meal Prep;Driving    Stability/Clinical Decision Making Evolving/Moderate complexity    Clinical Decision Making Moderate    Rehab Potential Excellent    PT Frequency --   3 visits over 2 weeks   PT Duration --   after 2 weeks 2x/week for 12 weeks.   PT Treatment/Interventions Passive range of motion;Manual techniques;Patient/family education;Therapeutic exercise;Cryotherapy;Therapeutic activities    PT Next Visit Plan PROM only for next week.  After that week start AAROM if pain still controlled.   Ice if needed.   See what auth dates are and cancel visit after 3rd auth visit from eval.    Consulted and Agree with Plan of Care Patient           Patient will benefit from skilled therapeutic intervention in order to improve the following deficits and impairments:  Pain,Impaired UE functional use,Decreased strength,Decreased activity tolerance,Increased muscle spasms,Decreased range of motion  Visit Diagnosis: S/P left rotator cuff repair  Left shoulder pain, unspecified chronicity  Muscle weakness (generalized)  Stiffness of  left shoulder, not elsewhere classified     Problem List Patient Active Problem List   Diagnosis Date Noted  . Varicose veins of bilateral lower extremities with pain 10/18/2019  . Hamstring strain 09/25/2019  . Vitamin D deficiency 06/19/2019  . Hyperlipidemia 03/13/2019  . Essential hypertension 03/13/2019  . PTSD (post-traumatic stress disorder) 03/13/2019  . History of substance abuse ([place]) 03/13/2019  . Overweight (BMI 25.0-29.9) 03/13/2019  . Cocaine abuse with cocaine-induced psychotic disorder ([place]) 07/09/2017  . MDD (major depressive disorder), recurrent severe, without psychosis ([place]) 07/09/2017    Darrel Hoover  PT 04/25/2020, 11:35 AM  [facility] [address] [place], [place], [address] Phone: [phone]   Fax:  [phone]  Name: Tonya Mckenzie MRN: [id number] Date of Birth: 1960/12/03

## 2020-04-29 ENCOUNTER — Encounter: Payer: Self-pay | Admitting: Physical Therapy

## 2020-04-29 ENCOUNTER — Ambulatory Visit: Payer: Medicaid Other | Attending: Orthopaedic Surgery | Admitting: Physical Therapy

## 2020-04-29 ENCOUNTER — Other Ambulatory Visit: Payer: Self-pay

## 2020-04-29 DIAGNOSIS — Z9889 Other specified postprocedural states: Secondary | ICD-10-CM | POA: Diagnosis present

## 2020-04-29 DIAGNOSIS — M25612 Stiffness of left shoulder, not elsewhere classified: Secondary | ICD-10-CM | POA: Insufficient documentation

## 2020-04-29 DIAGNOSIS — M6281 Muscle weakness (generalized): Secondary | ICD-10-CM | POA: Insufficient documentation

## 2020-04-29 DIAGNOSIS — M25512 Pain in left shoulder: Secondary | ICD-10-CM | POA: Diagnosis not present

## 2020-04-29 NOTE — Patient Instructions (Signed)
Perform Pendulums- 2 x per day, 1 minute , 7 days a week

## 2020-04-29 NOTE — Therapy (Signed)
[place], [place], [address] Phone: [phone]   Fax:  [phone]  Physical Therapy Treatment  Patient Details  Name: Tonya Mckenzie MRN: [id number] Date of Birth: 07/24/1960 Referring Provider (PT): Ophelia Charter, MD   Encounter Date: 04/29/2020   PT End of Session - 04/29/20 1105    Visit Number 2    Number of Visits 28    Date for PT Re-Evaluation 08/08/20    Authorization Type Medicaid [place] Access    Authorization Time Period pending-submitted 04/28/20    PT Start Time 1100    PT Stop Time 1133    PT Time Calculation (min) 33 min           Past Medical History:  Diagnosis Date  . Allergic rhinitis   . Anxiety   . Bacterial vaginosis   . Chronic post-traumatic stress disorder (PTSD)   . Cocaine abuse ([place])   . Depression   . ETOH abuse   . GERD (gastroesophageal reflux disease)   . Hyperlipidemia   . Hypertension    off meds due to weight loss  . Marijuana abuse   . Uterine leiomyoma     Past Surgical History:  Procedure Laterality Date  . ABDOMINAL HYSTERECTOMY     left ovaries, took cervix.  Performed for heavy periods and anemia  . BICEPT TENODESIS Left 03/06/2020   Procedure: BICEPS TENODESIS;  Surgeon: Hiram Gash, MD;  Location: [place];  Service: Orthopedics;  Laterality: Left;  . CHOLECYSTECTOMY    . SHOULDER ARTHROSCOPY WITH ROTATOR CUFF REPAIR AND SUBACROMIAL DECOMPRESSION Left 03/06/2020   Procedure: SHOULDER ARTHROSCOPY WITH ROTATOR CUFF REPAIR AND SUBACROMIAL DECOMPRESSION PARTIAL ACROMIOPLASTY AND DISTAL CLAVICULECTOMY;  Surgeon: Hiram Gash, MD;  Location: [place];  Service: Orthopedics;  Laterality: Left;  . thumb surgery     right  . TOE SURGERY Left   . TONSILLECTOMY    . TYMPANOSTOMY TUBE PLACEMENT      There were no vitals filed for this visit.   Subjective Assessment - 04/29/20 1103    Subjective Pt arrives reporting 5/10  pain at superior lateral shoulder and notes  increased edema in the area.    Currently in Pain? Yes    Pain Score 5     Pain Location Shoulder    Pain Orientation Left;Lateral;Upper    Pain Descriptors / Indicators Sore;Tightness    Pain Type Surgical pain              [facility] PT Assessment - 04/29/20 0001      PROM   Left Shoulder Flexion 130 Degrees    Left Shoulder ABduction 60 Degrees    Left Shoulder External Rotation 35 Degrees   at side                        [facility] Adult PT Treatment/Exercise - 04/29/20 0001      Self-Care   Self-Care Other Self-Care Comments;Heat/Ice Application    Heat/Ice Application Ice as PT progresses    Other Self-Care Comments  avoid lateral active motions, reviewed precautions      Exercises   Exercises Shoulder      Shoulder Exercises: ROM/Strengthening   Pendulum 1 minute                  PT Education - 04/29/20 1127    Education Details Verbally added pendulums for HEP    Person(s)  Educated Patient    Methods Explanation    Comprehension Verbalized understanding            PT Short Term Goals - 04/25/20 1128      PT SHORT TERM GOAL #1   Title She will be independent with initial HEP    Baseline no proram and in phase 1 of protocol    Time 2    Period Weeks    Status New      PT SHORT TERM GOAL #2   Title PROM will equal protocol limits of  flexion 140 degrees , abducion 60 degrees , ER 40 degrees at side.    Time 2    Period Weeks    Status New      PT SHORT TERM GOAL #3   Title Pain will remain in 1-3 level    Baseline varies but generally 3 or less    Time 2    Period Weeks    Status New             PT Long Term Goals - 04/25/20 1130      PT LONG TERM GOAL #1   Title She will be indpendent with all HEP issued    Baseline independent with initial HEP    Time 14    Period Weeks    Status New      PT LONG TERM GOAL #2   Title She will have 90% AROm compared to RT arm.     Baseline unable to perform AROM    Time 14    Period Weeks    Status New      PT LONG TERM GOAL #3   Title She will have intermittant and only mild pain with full use of LT arm    Baseline No functional use of LT arm/shoulder    Time 14    Period Weeks    Status New      PT LONG TERM GOAL #4   Title She will return to normal use of LT arm for self care aand home tasks    Baseline Not able to perform selfcare or home tasks normally with LT arm    Time 14    Period Weeks    Status New                 Plan - 04/29/20 1122    Clinical Impression Statement Pt arrives reporting increased pain 5/10,  and upper lateral shoulder with increased edema. She reports she is doing some exercises on her own including small runner arms and chicken wings. Educated pt to avoid these motions and only perform what is given by PT. Education provided on her protocol requirements required in order to advance to the next stage of therapy. Today, with manual PROM she achieved 130 flexion, 35 ER and 60 abduction. She is making progress toward ROM goals.    PT Next Visit Plan PROM only for next week.  After that week start AAROM if pain still controlled.   Ice if needed.   See what auth dates are and cancel visit after 3rd auth visit from eval.    PT Home Exercise Plan pendulums           Patient will benefit from skilled therapeutic intervention in order to improve the following deficits and impairments:  Pain,Impaired UE functional use,Decreased strength,Decreased activity tolerance,Increased muscle spasms,Decreased range of motion  Visit Diagnosis: Left shoulder pain, unspecified chronicity  S/P left rotator  cuff repair  Muscle weakness (generalized)  Stiffness of left shoulder, not elsewhere classified     Problem List Patient Active Problem List   Diagnosis Date Noted  . Varicose veins of bilateral lower extremities with pain 10/18/2019  . Hamstring strain 09/25/2019  . Vitamin D  deficiency 06/19/2019  . Hyperlipidemia 03/13/2019  . Essential hypertension 03/13/2019  . PTSD (post-traumatic stress disorder) 03/13/2019  . History of substance abuse ([place]) 03/13/2019  . Overweight (BMI 25.0-29.9) 03/13/2019  . Cocaine abuse with cocaine-induced psychotic disorder ([place]) 07/09/2017  . MDD (major depressive disorder), recurrent severe, without psychosis ([place]) 07/09/2017    Dorene Ar , PTA 04/29/2020, 11:31 AM  [facility] [address] [place], [place], [address] Phone: [phone]   Fax:  [phone]  Name: Tonya Mckenzie MRN: [id number] Date of Birth: March 20, 1960

## 2020-05-02 ENCOUNTER — Other Ambulatory Visit: Payer: Self-pay

## 2020-05-02 ENCOUNTER — Ambulatory Visit: Payer: Medicaid Other

## 2020-05-02 DIAGNOSIS — M6281 Muscle weakness (generalized): Secondary | ICD-10-CM

## 2020-05-02 DIAGNOSIS — M25612 Stiffness of left shoulder, not elsewhere classified: Secondary | ICD-10-CM

## 2020-05-02 DIAGNOSIS — M25512 Pain in left shoulder: Secondary | ICD-10-CM

## 2020-05-02 DIAGNOSIS — Z9889 Other specified postprocedural states: Secondary | ICD-10-CM

## 2020-05-02 NOTE — Therapy (Signed)
[place], [place], [address] Phone: [phone]   Fax:  [phone]  Physical Therapy Treatment  Patient Details  Name: Tonya Mckenzie MRN: [id number] Date of Birth: January 16, 1961 Referring Provider (PT): Ophelia Charter, MD   Encounter Date: 05/02/2020   PT End of Session - 05/02/20 0921    Visit Number 3    Number of Visits 28    Date for PT Re-Evaluation 08/08/20    Authorization Type Medicaid [place] Access    Authorization - Visit Number 1    Authorization - Number of Visits 3    PT Start Time [phone]    PT Stop Time 0956    PT Time Calculation (min) 38 min    Activity Tolerance Patient tolerated treatment well    Behavior During Therapy [facility] for tasks assessed/performed           Past Medical History:  Diagnosis Date  . Allergic rhinitis   . Anxiety   . Bacterial vaginosis   . Chronic post-traumatic stress disorder (PTSD)   . Cocaine abuse ([place])   . Depression   . ETOH abuse   . GERD (gastroesophageal reflux disease)   . Hyperlipidemia   . Hypertension    off meds due to weight loss  . Marijuana abuse   . Uterine leiomyoma     Past Surgical History:  Procedure Laterality Date  . ABDOMINAL HYSTERECTOMY     left ovaries, took cervix.  Performed for heavy periods and anemia  . BICEPT TENODESIS Left 03/06/2020   Procedure: BICEPS TENODESIS;  Surgeon: Hiram Gash, MD;  Location: [place];  Service: Orthopedics;  Laterality: Left;  . CHOLECYSTECTOMY    . SHOULDER ARTHROSCOPY WITH ROTATOR CUFF REPAIR AND SUBACROMIAL DECOMPRESSION Left 03/06/2020   Procedure: SHOULDER ARTHROSCOPY WITH ROTATOR CUFF REPAIR AND SUBACROMIAL DECOMPRESSION PARTIAL ACROMIOPLASTY AND DISTAL CLAVICULECTOMY;  Surgeon: Hiram Gash, MD;  Location: [place];  Service: Orthopedics;  Laterality: Left;  . thumb surgery     right  . TOE SURGERY Left   . TONSILLECTOMY    . TYMPANOSTOMY TUBE  PLACEMENT      There were no vitals filed for this visit.   Subjective Assessment - 05/02/20 0923    Subjective Doing well / Min pain    Pain Score 2     Pain Location Shoulder    Pain Orientation Left    Pain Descriptors / Indicators Aching    Pain Type Surgical pain    Pain Onset More than a month ago    Pain Frequency Intermittent    Aggravating Factors  using arm    Pain Relieving Factors rest              [facility] PT Assessment - 05/02/20 0001      Assessment   Medical Diagnosis Lt RTC repair    Referring Provider (PT) Ophelia Charter, MD      PROM   Left Shoulder Flexion 132 Degrees    Left Shoulder ABduction 90 Degrees    Left Shoulder External Rotation 40 Degrees   at side with end range pain                        [facility] Adult PT Treatment/Exercise - 05/02/20 0001      Manual Therapy   Manual Therapy Passive ROM    Passive ROM Lt shoulder all planes  to tolerance, Scapula ROm .  PT Education - 05/02/20 1000    Education Details Progression of PT and need to limit pain. Auth process for MCD.    Person(s) Educated Patient    Methods Explanation    Comprehension Verbalized understanding            PT Short Term Goals - 04/25/20 1128      PT SHORT TERM GOAL #1   Title She will be independent with initial HEP    Baseline no proram and in phase 1 of protocol    Time 2    Period Weeks    Status New      PT SHORT TERM GOAL #2   Title PROM will equal protocol limits of  flexion 140 degrees , abducion 60 degrees , ER 40 degrees at side.    Time 2    Period Weeks    Status New      PT SHORT TERM GOAL #3   Title Pain will remain in 1-3 level    Baseline varies but generally 3 or less    Time 2    Period Weeks    Status New             PT Long Term Goals - 04/25/20 1130      PT LONG TERM GOAL #1   Title She will be indpendent with all HEP issued    Baseline independent with initial HEP    Time 14    Period  Weeks    Status New      PT LONG TERM GOAL #2   Title She will have 90% AROm compared to RT arm.    Baseline unable to perform AROM    Time 14    Period Weeks    Status New      PT LONG TERM GOAL #3   Title She will have intermittant and only mild pain with full use of LT arm    Baseline No functional use of LT arm/shoulder    Time 14    Period Weeks    Status New      PT LONG TERM GOAL #4   Title She will return to normal use of LT arm for self care aand home tasks    Baseline Not able to perform selfcare or home tasks normally with LT arm    Time 14    Period Weeks    Status New                 Plan - 05/02/20 1001    Clinical Impression Statement Progressing well .Still limited with stiffness and pain. Needs ra-auth next session and will start AAROm as tolerated.    PT Treatment/Interventions Passive range of motion;Manual techniques;Patient/family education;Therapeutic exercise;Cryotherapy;Therapeutic activities    PT Next Visit Plan PROM only for next week.  After that week start AAROM if pain still controlled.   Ice if needed.   Reauth next session.    PT Home Exercise Plan pendulums    Consulted and Agree with Plan of Care Patient           Patient will benefit from skilled therapeutic intervention in order to improve the following deficits and impairments:     Visit Diagnosis: Left shoulder pain, unspecified chronicity  S/P left rotator cuff repair  Muscle weakness (generalized)  Stiffness of left shoulder, not elsewhere classified     Problem List Patient Active Problem List   Diagnosis Date Noted  . Varicose veins of bilateral  lower extremities with pain 10/18/2019  . Hamstring strain 09/25/2019  . Vitamin D deficiency 06/19/2019  . Hyperlipidemia 03/13/2019  . Essential hypertension 03/13/2019  . PTSD (post-traumatic stress disorder) 03/13/2019  . History of substance abuse ([place]) 03/13/2019  . Overweight (BMI 25.0-29.9) 03/13/2019  .  Cocaine abuse with cocaine-induced psychotic disorder ([place]) 07/09/2017  . MDD (major depressive disorder), recurrent severe, without psychosis ([place]) 07/09/2017    Darrel Hoover  PT 05/02/2020, 10:03 AM  [facility] [address] [place], [place], [address] Phone: [phone]   Fax:  [phone]  Name: Tonya Mckenzie MRN: [id number] Date of Birth: [birth year]

## 2020-05-06 ENCOUNTER — Other Ambulatory Visit: Payer: Self-pay

## 2020-05-06 ENCOUNTER — Ambulatory Visit: Payer: Medicaid Other

## 2020-05-06 DIAGNOSIS — Z9889 Other specified postprocedural states: Secondary | ICD-10-CM

## 2020-05-06 DIAGNOSIS — M25612 Stiffness of left shoulder, not elsewhere classified: Secondary | ICD-10-CM

## 2020-05-06 DIAGNOSIS — M25512 Pain in left shoulder: Secondary | ICD-10-CM | POA: Diagnosis not present

## 2020-05-06 DIAGNOSIS — M6281 Muscle weakness (generalized): Secondary | ICD-10-CM

## 2020-05-06 NOTE — Therapy (Signed)
[place], [place], [address] Phone: [phone]   Fax:  [phone]  Physical Therapy Treatment  Patient Details  Name: Tonya Mckenzie MRN: [id number] Date of Birth: Nov 24, 1960 Referring Provider (PT): Ophelia Charter, MD   Encounter Date: 05/06/2020   PT End of Session - 05/06/20 1158    Visit Number 4    Number of Visits 28    Date for PT Re-Evaluation 08/08/20    Authorization Type Medicaid [place] Access    Authorization Time Period 3 visits 04/29/20-05/19/20    Authorization - Visit Number 3    Authorization - Number of Visits 3    PT Start Time 1007    PT Stop Time 1106    PT Time Calculation (min) 59 min    Activity Tolerance Patient tolerated treatment well    Behavior During Therapy [facility] for tasks assessed/performed           Past Medical History:  Diagnosis Date  . Allergic rhinitis   . Anxiety   . Bacterial vaginosis   . Chronic post-traumatic stress disorder (PTSD)   . Cocaine abuse ([place])   . Depression   . ETOH abuse   . GERD (gastroesophageal reflux disease)   . Hyperlipidemia   . Hypertension    off meds due to weight loss  . Marijuana abuse   . Uterine leiomyoma     Past Surgical History:  Procedure Laterality Date  . ABDOMINAL HYSTERECTOMY     left ovaries, took cervix.  Performed for heavy periods and anemia  . BICEPT TENODESIS Left 03/06/2020   Procedure: BICEPS TENODESIS;  Surgeon: Hiram Gash, MD;  Location: [place];  Service: Orthopedics;  Laterality: Left;  . CHOLECYSTECTOMY    . SHOULDER ARTHROSCOPY WITH ROTATOR CUFF REPAIR AND SUBACROMIAL DECOMPRESSION Left 03/06/2020   Procedure: SHOULDER ARTHROSCOPY WITH ROTATOR CUFF REPAIR AND SUBACROMIAL DECOMPRESSION PARTIAL ACROMIOPLASTY AND DISTAL CLAVICULECTOMY;  Surgeon: Hiram Gash, MD;  Location: [place];  Service: Orthopedics;  Laterality: Left;  . thumb surgery     right  . TOE SURGERY  Left   . TONSILLECTOMY    . TYMPANOSTOMY TUBE PLACEMENT      There were no vitals filed for this visit.   Subjective Assessment - 05/06/20 1012    Subjective Pt reports her L shoulder pain is low.    Limitations House hold activities;Lifting    Patient Stated Goals For arm ot get total mobility like RT arm    Currently in Pain? Yes    Pain Score 1     Pain Location Shoulder    Pain Orientation Left    Pain Descriptors / Indicators Aching    Pain Type Surgical pain    Pain Onset More than a month ago    Pain Frequency Intermittent    Aggravating Factors  using arm, pain at night    Pain Relieving Factors rest    Multiple Pain Sites No                             [facility] Adult PT Treatment/Exercise - 05/06/20 0001      Exercises   Exercises Shoulder      Shoulder Exercises: Supine   Other Supine Exercises Wand exs for press, flexion and ER/IR 10x      Shoulder Exercises: Standing   Other Standing Exercises AAROM on table top for flexion, scaption,  and ER/IR.10x each      Modalities   Modalities Cryotherapy      Cryotherapy   Number Minutes Cryotherapy 15 Minutes    Cryotherapy Location Shoulder   L   Type of Cryotherapy Ice pack                  PT Education - 05/06/20 1155    Education Details HEP- AAROM    Person(s) Educated Patient    Methods Explanation;Demonstration;Tactile cues;Verbal cues;Handout    Comprehension Verbalized understanding;Returned demonstration;Verbal cues required;Tactile cues required            PT Short Term Goals - 05/06/20 1356      PT SHORT TERM GOAL #1   Title She will be independent with initial HEP    Baseline initiated 05/06/20    Period Weeks    Status On-going    Target Date 05/20/20      PT SHORT TERM GOAL #2   Title PROM will equal protocol limits of  flexion 140 degrees , abducion 60 degrees , ER 40 degrees at side. 05/06/20: L AAROM flexion =120d    Status On-going    Target Date 05/20/20       PT SHORT TERM GOAL #3   Title Pain will remain in 1-3 level; 05/06/20:1-3/10    Period Weeks    Status Achieved             PT Long Term Goals - 05/06/20 1359      PT LONG TERM GOAL #1   Title She will be indpendent with all HEP issued    Status On-going    Target Date 08/08/20      PT LONG TERM GOAL #2   Title She will have 90% AROm compared to RT arm.    Baseline unable to perform AROM    Status On-going    Target Date 08/08/20      PT LONG TERM GOAL #3   Title She will have intermittant and only mild pain with full use of LT arm    Baseline No functional use of LT arm/shoulder    Status On-going    Target Date 08/08/20      PT LONG TERM GOAL #4   Title She will return to normal use of LT arm for self care aand home tasks    Baseline Not able to perform selfcare or home tasks normally with LT arm    Status On-going    Target Date 08/08/20                 Plan - 05/06/20 1151    Clinical Impression Statement Pt was progressed to phase 2 of her L shoulder rehab with the initiation of AAROM exs. PT was completed for L shoulder AAROM. Verbal cueing for minimal stretching discomfort only at the endrange of motions. With the initial ex of table top supported flexion, pt was too aggressive with the first few reps., but after verbal correction, demonstrated appropriated AAROM with report of min stretching with remaining reps. and exs. Pt reported it felt good to start moving her L shoulder/arm. A written HEP was provided. Pt tolerated the PT session with an increase in L shoulder pain from 1 to 3/10. A cold pack was applied to the L shoulder for 15 mins at end of session.    Examination-Activity Limitations Bathing;Hygiene/Grooming;Lift;Reach Overhead;Carry;Dressing    Examination-Participation Restrictions Laundry;Cleaning;Community Activity;Meal Prep;Driving    Stability/Clinical Decision Making Evolving/Moderate complexity  Clinical Decision Making Moderate    Rehab  Potential Excellent    PT Frequency 2x / week    PT Duration Other (comment)   13 weeks   PT Treatment/Interventions Passive range of motion;Manual techniques;Patient/family education;Therapeutic exercise;Cryotherapy;Therapeutic activities    PT Next Visit Plan Review response to AAROM exs.   Ice if needed.    PT Home Exercise Plan [id number]    Consulted and Agree with Plan of Care Patient           Patient will benefit from skilled therapeutic intervention in order to improve the following deficits and impairments:  Pain,Impaired UE functional use,Decreased strength,Decreased activity tolerance,Increased muscle spasms,Decreased range of motion  Visit Diagnosis: Left shoulder pain, unspecified chronicity  S/P left rotator cuff repair  Muscle weakness (generalized)  Stiffness of left shoulder, not elsewhere classified     Problem List Patient Active Problem List   Diagnosis Date Noted  . Varicose veins of bilateral lower extremities with pain 10/18/2019  . Hamstring strain 09/25/2019  . Vitamin D deficiency 06/19/2019  . Hyperlipidemia 03/13/2019  . Essential hypertension 03/13/2019  . PTSD (post-traumatic stress disorder) 03/13/2019  . History of substance abuse ([place]) 03/13/2019  . Overweight (BMI 25.0-29.9) 03/13/2019  . Cocaine abuse with cocaine-induced psychotic disorder ([place]) 07/09/2017  . MDD (major depressive disorder), recurrent severe, without psychosis ([place]) 07/09/2017    Gar Ponto MS, PT 05/06/20 2:13 PM  [facility] [address] [place], [place], [address] Phone: [phone]   Fax:  [phone]  Name: Tonya Mckenzie MRN: [id number] Date of Birth: 06-27-1960

## 2020-05-09 ENCOUNTER — Ambulatory Visit: Payer: Medicaid Other | Admitting: Physical Therapy

## 2020-05-13 ENCOUNTER — Encounter: Payer: Self-pay | Admitting: Physical Therapy

## 2020-05-13 ENCOUNTER — Ambulatory Visit: Payer: Medicaid Other | Admitting: Physical Therapy

## 2020-05-13 ENCOUNTER — Other Ambulatory Visit: Payer: Self-pay

## 2020-05-13 DIAGNOSIS — M25512 Pain in left shoulder: Secondary | ICD-10-CM | POA: Diagnosis not present

## 2020-05-13 DIAGNOSIS — Z9889 Other specified postprocedural states: Secondary | ICD-10-CM

## 2020-05-13 DIAGNOSIS — M6281 Muscle weakness (generalized): Secondary | ICD-10-CM

## 2020-05-13 DIAGNOSIS — M25612 Stiffness of left shoulder, not elsewhere classified: Secondary | ICD-10-CM

## 2020-05-13 NOTE — Therapy (Signed)
[place], [place], [address] Phone: [phone]   Fax:  [phone]  Physical Therapy Treatment  Patient Details  Name: Tonya Mckenzie MRN: [id number] Date of Birth: 10-01-1960 Referring Provider (PT): Ophelia Charter, MD   Encounter Date: 05/13/2020   PT End of Session - 05/13/20 0936    Visit Number 5    Number of Visits 28    Date for PT Re-Evaluation 08/08/20    Authorization Type Medicaid [place] Time Period 3 visits 04/29/20-05/19/20, Josem Kaufmann resubmitted.    PT Start Time [id number]    PT Stop Time 1027    PT Time Calculation (min) 53 min           Past Medical History:  Diagnosis Date  . Allergic rhinitis   . Anxiety   . Bacterial vaginosis   . Chronic post-traumatic stress disorder (PTSD)   . Cocaine abuse ([place])   . Depression   . ETOH abuse   . GERD (gastroesophageal reflux disease)   . Hyperlipidemia   . Hypertension    off meds due to weight loss  . Marijuana abuse   . Uterine leiomyoma     Past Surgical History:  Procedure Laterality Date  . ABDOMINAL HYSTERECTOMY     left ovaries, took cervix.  Performed for heavy periods and anemia  . BICEPT TENODESIS Left 03/06/2020   Procedure: BICEPS TENODESIS;  Surgeon: Hiram Gash, MD;  Location: [place];  Service: Orthopedics;  Laterality: Left;  . CHOLECYSTECTOMY    . SHOULDER ARTHROSCOPY WITH ROTATOR CUFF REPAIR AND SUBACROMIAL DECOMPRESSION Left 03/06/2020   Procedure: SHOULDER ARTHROSCOPY WITH ROTATOR CUFF REPAIR AND SUBACROMIAL DECOMPRESSION PARTIAL ACROMIOPLASTY AND DISTAL CLAVICULECTOMY;  Surgeon: Hiram Gash, MD;  Location: [place];  Service: Orthopedics;  Laterality: Left;  . thumb surgery     right  . TOE SURGERY Left   . TONSILLECTOMY    . TYMPANOSTOMY TUBE PLACEMENT      There were no vitals filed for this visit.   Subjective Assessment - 05/13/20 0936    Subjective Pt  reports no pain and feels shoulder stretches are helping her.    Currently in Pain? No/denies              [facility] PT Assessment - 05/13/20 0001      AROM   Left Shoulder Flexion 115 Degrees   standing     PROM   Left Shoulder Flexion 140 Degrees    Left Shoulder ABduction 130 Degrees    Left Shoulder Internal Rotation 70 Degrees    Left Shoulder External Rotation 55 Degrees   at side, also 70 degrees @ 45 degrees abduction                        [facility] Adult PT Treatment/Exercise - 05/13/20 0001      Shoulder Exercises: Supine   Other Supine Exercises Wand exs for press, flexion and ER/IR 10x      Shoulder Exercises: Standing   Other Standing Exercises AAROM on table top for flexion, scaption, and ER/IR.10x each      Shoulder Exercises: Pulleys   Flexion 2 minutes    Scaption 2 minutes      Cryotherapy   Number Minutes Cryotherapy 15 Minutes    Cryotherapy Location Shoulder   L   Type of Cryotherapy Ice pack      Manual Therapy  Manual therapy comments gentle A/P  and inferior glides    Passive ROM Lt shoulder all planes  to tolerance, Scapula ROm .                    PT Short Term Goals - 05/13/20 1031      PT SHORT TERM GOAL #1   Title She will be independent with initial HEP    Period Weeks    Status Achieved    Target Date 05/20/20      PT SHORT TERM GOAL #2   Title PROM will equal protocol limits of  flexion 140 degrees , abducion 60 degrees , ER 40 degrees at side. 05/06/20: L AAROM flexion =120d    Time 2    Period Weeks    Status Achieved    Target Date 05/20/20      PT SHORT TERM GOAL #3   Title Pain will remain in 1-3 level; 05/06/20:1-3/10    Baseline varies but generally 3 or less    Time 2    Period Weeks    Status Achieved             PT Long Term Goals - 05/06/20 1359      PT LONG TERM GOAL #1   Title She will be indpendent with all HEP issued    Status On-going    Target Date 08/08/20      PT LONG TERM  GOAL #2   Title She will have 90% AROm compared to RT arm.    Baseline unable to perform AROM    Status On-going    Target Date 08/08/20      PT LONG TERM GOAL #3   Title She will have intermittant and only mild pain with full use of LT arm    Baseline No functional use of LT arm/shoulder    Status On-going    Target Date 08/08/20      PT LONG TERM GOAL #4   Title She will return to normal use of LT arm for self care aand home tasks    Baseline Not able to perform selfcare or home tasks normally with LT arm    Status On-going    Target Date 08/08/20                 Plan - 05/13/20 0936    Clinical Impression Statement Pt is 9 weeks S/P RTC repair. Pt reports no pain and improved sleep tolerance. She reports good tolerance to new AAROM HEP issued at last visit. Reviewed new HEP and began pulleys. Manual shoulder mobs and PROM performed to increased ROM. She demonstrates improved PROM in all planes. She reported some minor pain with end range shoulder flexion PROM. She will likely be ready for strengthening at next session if she is still doing well. All STGs met.    PT Next Visit Plan continue AAROM as tolerated, manual/PROM.   Ice if needed. consider strengthening    PT Home Exercise Plan [phone]           Patient will benefit from skilled therapeutic intervention in order to improve the following deficits and impairments:  Pain,Impaired UE functional use,Decreased strength,Decreased activity tolerance,Increased muscle spasms,Decreased range of motion  Visit Diagnosis: Left shoulder pain, unspecified chronicity  S/P left rotator cuff repair  Muscle weakness (generalized)  Stiffness of left shoulder, not elsewhere classified     Problem List Patient Active Problem List   Diagnosis Date Noted  .  Varicose veins of bilateral lower extremities with pain 10/18/2019  . Hamstring strain 09/25/2019  . Vitamin D deficiency 06/19/2019  . Hyperlipidemia 03/13/2019  .  Essential hypertension 03/13/2019  . PTSD (post-traumatic stress disorder) 03/13/2019  . History of substance abuse ([place]) 03/13/2019  . Overweight (BMI 25.0-29.9) 03/13/2019  . Cocaine abuse with cocaine-induced psychotic disorder ([place]) 07/09/2017  . MDD (major depressive disorder), recurrent severe, without psychosis ([place]) 07/09/2017    Dorene Ar, PTA 05/13/2020, 10:32 AM  [facility] [address] [place], [place], [address] Phone: [phone]   Fax:  [phone]  Name: Tonya Mckenzie MRN: [id number] Date of Birth: 1960/11/25

## 2020-05-15 ENCOUNTER — Telehealth: Payer: Self-pay

## 2020-05-15 DIAGNOSIS — B3731 Acute candidiasis of vulva and vagina: Secondary | ICD-10-CM

## 2020-05-15 DIAGNOSIS — B373 Candidiasis of vulva and vagina: Secondary | ICD-10-CM

## 2020-05-15 MED ORDER — FLUCONAZOLE 150 MG PO TABS: 150.0000 mg | ORAL_TABLET | Freq: Once | ORAL | 0 refills | Status: AC

## 2020-05-15 NOTE — Telephone Encounter (Signed)
Pt stopped by the office to request Dr Sandi Carne call in 2 pills for the yeast infection the current medication is causing her.

## 2020-05-15 NOTE — Telephone Encounter (Signed)
Rx sent for 1 diflucan.  Has appointment on 3/30

## 2020-05-16 ENCOUNTER — Ambulatory Visit: Payer: Medicaid Other

## 2020-05-16 ENCOUNTER — Other Ambulatory Visit: Payer: Self-pay

## 2020-05-16 DIAGNOSIS — M25512 Pain in left shoulder: Secondary | ICD-10-CM

## 2020-05-16 DIAGNOSIS — M6281 Muscle weakness (generalized): Secondary | ICD-10-CM

## 2020-05-16 DIAGNOSIS — Z9889 Other specified postprocedural states: Secondary | ICD-10-CM

## 2020-05-16 NOTE — Therapy (Signed)
[place], [place], [address] Phone: [phone]   Fax:  [phone]  Physical Therapy Treatment  Patient Details  Name: Tonya Mckenzie MRN: [id number] Date of Birth: 1960/05/25 Referring Provider (PT): Ophelia Charter, MD   Encounter Date: 05/16/2020   PT End of Session - 05/16/20 2216    Visit Number 6    Number of Visits 28    Date for PT Re-Evaluation 08/08/20    Authorization Type Medicaid [place] Access    PT Start Time 4627    PT Stop Time 1150    PT Time Calculation (min) 59 min    Activity Tolerance Patient tolerated treatment well    Behavior During Therapy [facility] for tasks assessed/performed           Past Medical History:  Diagnosis Date  . Allergic rhinitis   . Anxiety   . Bacterial vaginosis   . Chronic post-traumatic stress disorder (PTSD)   . Cocaine abuse ([place])   . Depression   . ETOH abuse   . GERD (gastroesophageal reflux disease)   . Hyperlipidemia   . Hypertension    off meds due to weight loss  . Marijuana abuse   . Uterine leiomyoma     Past Surgical History:  Procedure Laterality Date  . ABDOMINAL HYSTERECTOMY     left ovaries, took cervix.  Performed for heavy periods and anemia  . BICEPT TENODESIS Left 03/06/2020   Procedure: BICEPS TENODESIS;  Surgeon: Hiram Gash, MD;  Location: [place];  Service: Orthopedics;  Laterality: Left;  . CHOLECYSTECTOMY    . SHOULDER ARTHROSCOPY WITH ROTATOR CUFF REPAIR AND SUBACROMIAL DECOMPRESSION Left 03/06/2020   Procedure: SHOULDER ARTHROSCOPY WITH ROTATOR CUFF REPAIR AND SUBACROMIAL DECOMPRESSION PARTIAL ACROMIOPLASTY AND DISTAL CLAVICULECTOMY;  Surgeon: Hiram Gash, MD;  Location: [place];  Service: Orthopedics;  Laterality: Left;  . thumb surgery     right  . TOE SURGERY Left   . TONSILLECTOMY    . TYMPANOSTOMY TUBE PLACEMENT      There were no vitals filed for this visit.   Subjective  Assessment - 05/16/20 1102    Subjective Pt reports her L shoulder is doing well. Only a little pain after exercsing last night. pt remarks she is pleased that the L shoulder pain has improved.    Limitations House hold activities;Lifting    Patient Stated Goals For arm ot get total mobility like RT arm    Currently in Pain? Yes    Pain Score 1     Pain Location Shoulder    Pain Orientation Left    Pain Descriptors / Indicators Aching    Pain Type Surgical pain    Pain Onset More than a month ago    Pain Frequency Intermittent    Aggravating Factors  using arm, but is getter better    Pain Relieving Factors rest              [facility] PT Assessment - 05/16/20 0001      PROM   Left Shoulder Flexion 140 Degrees    Left Shoulder ABduction 130 Degrees    Left Shoulder Internal Rotation 70 Degrees    Left Shoulder External Rotation 55 Degrees   at side, also 70 degrees @ 45 degrees abduction                        [facility] Adult PT Treatment/Exercise - 05/16/20  0001      Exercises   Exercises Shoulder      Shoulder Exercises: Standing   Other Standing Exercises AAROM on table top for flexion, scaption, and ER/IR.10x each      Shoulder Exercises: Pulleys   Flexion 2 minutes    Scaption 2 minutes      Shoulder Exercises: Isometric Strengthening   Flexion 5X5"    Extension 3X5"    External Rotation 5X5"    Internal Rotation 5X5"    ABduction 5X5"      Modalities   Modalities Cryotherapy      Cryotherapy   Number Minutes Cryotherapy 15 Minutes    Cryotherapy Location Shoulder   L   Type of Cryotherapy Other (comment)   game ready-vaso                 PT Education - 05/16/20 2214    Education Details HEP-Shoulder isometric strengthening exs    Person(s) Educated Patient    Methods Explanation;Demonstration;Tactile cues;Verbal cues;Handout    Comprehension Verbalized understanding;Returned demonstration;Verbal cues required;Tactile cues required             PT Short Term Goals - 05/16/20 2221      PT SHORT TERM GOAL #1   Title She will be independent with initial HEP    Baseline initiated 05/06/20    Period Weeks    Status Achieved    Target Date 05/20/20      PT SHORT TERM GOAL #2   Title PROM will equal protocol limits of  flexion 140 degrees , abducion 60 degrees , ER 40 degrees at side. 05/06/20: L AAROM flexion =120d    Time 2    Period Weeks    Status Achieved    Target Date 05/20/20      PT SHORT TERM GOAL #3   Title Pain will remain in 1-3 level; 05/06/20:1-3/10    Baseline varies but generally 3 or less    Period Weeks    Status Achieved             PT Long Term Goals - 05/16/20 2223      PT LONG TERM GOAL #1   Title She will be indpendent with all HEP issued    Baseline independent with initial HEP    Time 14    Period Weeks    Status On-going    Target Date 08/08/20      PT LONG TERM GOAL #2   Title She will have 90% AROm compared to RT arm.    Baseline unable to perform AROM    Time 14    Period Weeks    Status On-going    Target Date 08/08/20      PT LONG TERM GOAL #3   Title She will have intermittant and only mild pain with full use of LT arm    Baseline No functional use of LT arm/shoulder    Time 14    Period Weeks    Status On-going    Target Date 08/08/20      PT LONG TERM GOAL #4   Title She will return to normal use of LT arm for self care aand home tasks    Baseline Not able to perform selfcare or home tasks normally with LT arm    Time 14    Period Weeks    Status On-going    Target Date 08/08/20  Plan - 05/16/20 2219    Clinical Impression Statement Pt is making good progress with her L shoulder following a RTC repair. Pt has met all short term goals re:L shoulder pain and PROM, and pt being Ind with her initial HEP. Pt's rehab program was progressed today with isometric strengthening exs initiated. Pt tolerated the isometric exs well and after  instruction, pt performed them correctly. Isometric exs were added to the pt's HEP and she is to complete them every other day. Will gradually progress AAROM exs to AROM exs as tolerated.    Examination-Activity Limitations Bathing;Hygiene/Grooming;Lift;Reach Overhead;Carry;Dressing    Examination-Participation Restrictions Laundry;Cleaning;Community Activity;Meal Prep;Driving    Stability/Clinical Decision Making Evolving/Moderate complexity    Clinical Decision Making Moderate    Rehab Potential Excellent    PT Frequency 2x / week    PT Duration Other (comment)   13   PT Treatment/Interventions Passive range of motion;Manual techniques;Patient/family education;Therapeutic exercise;Cryotherapy;Therapeutic activities    PT Next Visit Plan Assess response to isometric exs. Continue AAROM progressing to AROM exs as tolerated, manual/PROM. Ice if needed.    PT Home Exercise Plan [id number]    Consulted and Agree with Plan of Care Patient           Patient will benefit from skilled therapeutic intervention in order to improve the following deficits and impairments:  Pain,Impaired UE functional use,Decreased strength,Decreased activity tolerance,Increased muscle spasms,Decreased range of motion  Visit Diagnosis: Left shoulder pain, unspecified chronicity  S/P left rotator cuff repair  Muscle weakness (generalized)     Problem List Patient Active Problem List   Diagnosis Date Noted  . Varicose veins of bilateral lower extremities with pain 10/18/2019  . Hamstring strain 09/25/2019  . Vitamin D deficiency 06/19/2019  . Hyperlipidemia 03/13/2019  . Essential hypertension 03/13/2019  . PTSD (post-traumatic stress disorder) 03/13/2019  . History of substance abuse ([place]) 03/13/2019  . Overweight (BMI 25.0-29.9) 03/13/2019  . Cocaine abuse with cocaine-induced psychotic disorder ([place]) 07/09/2017  . MDD (major depressive disorder), recurrent severe, without psychosis ([place]) 07/09/2017    Gar Ponto MS, PT 05/16/20 10:53 PM  [facility] [address] [place], [place], [address] Phone: [phone]   Fax:  [phone]  Name: Tonya Mckenzie MRN: [id number] Date of Birth: 1961-02-05

## 2020-05-19 ENCOUNTER — Other Ambulatory Visit: Payer: Self-pay | Admitting: *Deleted

## 2020-05-19 DIAGNOSIS — E785 Hyperlipidemia, unspecified: Secondary | ICD-10-CM

## 2020-05-19 MED ORDER — ROSUVASTATIN CALCIUM 40 MG PO TABS: 40.0000 mg | ORAL_TABLET | Freq: Every day | ORAL | 1 refills | Status: DC

## 2020-05-22 ENCOUNTER — Ambulatory Visit: Payer: Medicaid Other

## 2020-05-22 ENCOUNTER — Other Ambulatory Visit: Payer: Self-pay | Admitting: Family Medicine

## 2020-05-22 DIAGNOSIS — I10 Essential (primary) hypertension: Secondary | ICD-10-CM

## 2020-05-27 NOTE — Progress Notes (Signed)
    SUBJECTIVE:   CHIEF COMPLAINT / HPI:   Popping in ears When she swallows, her ears pop Has been going on a while Had tubes when she was a child No pain No changes in hearing Sometimes has a ringing, but when yawns, this goes away  Left Shoulder Pain S/P rotator cuff repain in January Follows with Dr. Griffin Basil at [facility] Has an appointment tomorrow Wanted to know if she could get a shoulder injection because she still has pain lifting her arm over her head Pain and ROM is improving She has been going to physical therapy and likes it  PERTINENT  PMH / PSH: HTN, MDD, HLD, PTSD  OBJECTIVE:   BP 127/65   Pulse 67   Ht 5\' 6"  (1.676 m)   Wt 171 lb 9.6 oz (77.8 kg)   SpO2 98%   BMI 27.70 kg/m    Physical Exam:  General: 60 y.o. female in NAD HEENT: B/l TMs with scars from previous tympanostomy tubes, no other abnormalities b/l Lungs: Breathing comfortably on RA Skin: warm and dry Extremities: No edema   ASSESSMENT/PLAN:   Eustachian tube disorder, bilateral Symptoms most likely consistent with eustachian tube dysfunction.  Recommended Valsalva remover 15 times for 1 set, repeat set 3 times daily.  Offered intranasal corticosteroid, but patient declines at this time.  Advised that if she does not have improvement in the next few weeks and would like to start this, to call the office and prescription can be sent.  If this does not help, can return to care.  Other injury of muscle(s) and tendon(s) of the rotator cuff of left shoulder, subsequent encounter Given the patient has recently had surgery of her left rotator cuff and is still being actively followed by orthopedics, will defer to them if they would be able to perform a corticosteroid injection.  She is okay with this and will follow up with them.     Cleophas Dunker, [place]

## 2020-05-28 ENCOUNTER — Other Ambulatory Visit: Payer: Self-pay

## 2020-05-28 ENCOUNTER — Ambulatory Visit (INDEPENDENT_AMBULATORY_CARE_PROVIDER_SITE_OTHER): Payer: Medicaid Other | Admitting: Family Medicine

## 2020-05-28 ENCOUNTER — Encounter: Payer: Self-pay | Admitting: Family Medicine

## 2020-05-28 VITALS — BP 127/65 | HR 67 | Ht 66.0 in | Wt 171.6 lb

## 2020-05-28 DIAGNOSIS — H6993 Unspecified Eustachian tube disorder, bilateral: Secondary | ICD-10-CM | POA: Diagnosis not present

## 2020-05-28 DIAGNOSIS — Z23 Encounter for immunization: Secondary | ICD-10-CM | POA: Diagnosis not present

## 2020-05-28 DIAGNOSIS — S46092D Other injury of muscle(s) and tendon(s) of the rotator cuff of left shoulder, subsequent encounter: Secondary | ICD-10-CM | POA: Diagnosis not present

## 2020-05-28 NOTE — Assessment & Plan Note (Signed)
Given the patient has recently had surgery of her left rotator cuff and is still being actively followed by orthopedics, will defer to them if they would be able to perform a corticosteroid injection.  She is okay with this and will follow up with them.

## 2020-05-28 NOTE — Patient Instructions (Signed)
Thank you for coming to see me today. It was a pleasure. Today we talked about:   Talk to Dr. Griffin Basil about your shoulder pain.  Do the exercises I showed you for your ears about 15x, 3 times a day.  If not getting better over the next few weeks, let me know and we can send in a nasal steroid.  Please follow-up with me in 3 months.  If you have any questions or concerns, please do not hesitate to call the office at [phone].  Best,   Arizona Constable, DO

## 2020-05-28 NOTE — Assessment & Plan Note (Signed)
Symptoms most likely consistent with eustachian tube dysfunction.  Recommended Valsalva remover 15 times for 1 set, repeat set 3 times daily.  Offered intranasal corticosteroid, but patient declines at this time.  Advised that if she does not have improvement in the next few weeks and would like to start this, to call the office and prescription can be sent.  If this does not help, can return to care.

## 2020-05-30 ENCOUNTER — Ambulatory Visit: Payer: Medicaid Other | Attending: Orthopaedic Surgery | Admitting: Physical Therapy

## 2020-05-30 ENCOUNTER — Other Ambulatory Visit: Payer: Self-pay

## 2020-05-30 ENCOUNTER — Encounter: Payer: Self-pay | Admitting: Physical Therapy

## 2020-05-30 DIAGNOSIS — M25512 Pain in left shoulder: Secondary | ICD-10-CM

## 2020-05-30 DIAGNOSIS — M25612 Stiffness of left shoulder, not elsewhere classified: Secondary | ICD-10-CM | POA: Diagnosis present

## 2020-05-30 DIAGNOSIS — M6281 Muscle weakness (generalized): Secondary | ICD-10-CM

## 2020-05-30 DIAGNOSIS — Z9889 Other specified postprocedural states: Secondary | ICD-10-CM | POA: Diagnosis present

## 2020-05-30 NOTE — Therapy (Signed)
[facility] [place], [place], [address] Phone: [phone]   Fax:  [phone]  Physical Therapy Treatment  Patient Details  Name: Tonya Mckenzie MRN: [id number] Date of Birth: 1960-12-25 Referring Provider (PT): Ophelia Charter, MD   Encounter Date: 05/30/2020   PT End of Session - 05/30/20 0910    Visit Number 7    Number of Visits 28    Date for PT Re-Evaluation 08/08/20    Authorization Type Medicaid [place] Access    Authorization Time Period 3 visits 04/29/20-05/19/20, auth resubmitted and approved for 05/20/20-06/30/20    Authorization - Visit Number 1    Authorization - Number of Visits 12    PT Start Time 0846    PT Stop Time 0940    PT Time Calculation (min) 54 min           Past Medical History:  Diagnosis Date  . Allergic rhinitis   . Anxiety   . Bacterial vaginosis   . Chronic post-traumatic stress disorder (PTSD)   . Cocaine abuse ([place])   . Depression   . ETOH abuse   . GERD (gastroesophageal reflux disease)   . Hyperlipidemia   . Hypertension    off meds due to weight loss  . Marijuana abuse   . Uterine leiomyoma     Past Surgical History:  Procedure Laterality Date  . ABDOMINAL HYSTERECTOMY     left ovaries, took cervix.  Performed for heavy periods and anemia  . BICEPT TENODESIS Left 03/06/2020   Procedure: BICEPS TENODESIS;  Surgeon: Hiram Gash, MD;  Location: [place];  Service: Orthopedics;  Laterality: Left;  . CHOLECYSTECTOMY    . SHOULDER ARTHROSCOPY WITH ROTATOR CUFF REPAIR AND SUBACROMIAL DECOMPRESSION Left 03/06/2020   Procedure: SHOULDER ARTHROSCOPY WITH ROTATOR CUFF REPAIR AND SUBACROMIAL DECOMPRESSION PARTIAL ACROMIOPLASTY AND DISTAL CLAVICULECTOMY;  Surgeon: Hiram Gash, MD;  Location: [facility];  Service: Orthopedics;  Laterality: Left;  . thumb surgery     right  . TOE SURGERY Left   . TONSILLECTOMY    . TYMPANOSTOMY TUBE PLACEMENT       There were no vitals filed for this visit.   Subjective Assessment - 05/30/20 0851    Subjective I saw MD yesterday who said I could strengthen with hand weights. He gave me an injection and said I probably should not need to see him anymore. I have no pain. I was having pain at night but I gont the injection yesterday and I slept like a baby last night.    Currently in Pain? No/denies              [facility] PT Assessment - 05/30/20 0001      AROM   Left Shoulder Flexion 112 Degrees   150 AAROM on pulleys   Left Shoulder ABduction 105 Degrees    Left Shoulder Internal Rotation --   reach t-12   Left Shoulder External Rotation --   reac t2                        [facility] Adult PT Treatment/Exercise - 05/30/20 0001      Shoulder Exercises: Supine   Protraction 15 reps;AROM    Other Supine Exercises supine flexion oullovers      Shoulder Exercises: Standing   Other Standing Exercises standing cane flexion , abduction with cues for eccentric lowering      Shoulder Exercises: Pulleys  Flexion 2 minutes    Scaption 2 minutes      Shoulder Exercises: Isometric Strengthening   Flexion 5X5"    Extension 3X5"    External Rotation 5X5"    Internal Rotation 5X5"    ABduction 5X5"      Modalities   Modalities Vasopneumatic      Vasopneumatic   Number Minutes Vasopneumatic  15 minutes    Vasopnuematic Location  Shoulder    Vasopneumatic Pressure Low    Vasopneumatic Temperature  34      Manual Therapy   Passive ROM Lt shoulder all planes  to tolerance, Scapula ROm .                    PT Short Term Goals - 05/16/20 2221      PT SHORT TERM GOAL #1   Title She will be independent with initial HEP    Baseline initiated 05/06/20    Period Weeks    Status Achieved    Target Date 05/20/20      PT SHORT TERM GOAL #2   Title PROM will equal protocol limits of  flexion 140 degrees , abducion 60 degrees , ER 40 degrees at side. 05/06/20: L AAROM flexion  =120d    Time 2    Period Weeks    Status Achieved    Target Date 05/20/20      PT SHORT TERM GOAL #3   Title Pain will remain in 1-3 level; 05/06/20:1-3/10    Baseline varies but generally 3 or less    Period Weeks    Status Achieved             PT Long Term Goals - 05/30/20 0911      PT LONG TERM GOAL #1   Title She will be indpendent with all HEP issued    Baseline needs significant cues for newest addiiton to HEP- isometrics    Time 14    Period Weeks    Status On-going      PT LONG TERM GOAL #2   Title She will have 90% AROm compared to RT arm.    Baseline AROM limited compared to AAROM    Time 14    Period Weeks    Status On-going      PT LONG TERM GOAL #3   Title She will have intermittant and only mild pain with full use of LT arm    Baseline no pain unless sleeping positions- better since injecton, not using arm fully yet    Time 14    Period Weeks    Status On-going      PT LONG TERM GOAL #4   Title She will return to normal use of LT arm for self care aand home tasks    Baseline lemited with AROM    Time 14    Period Weeks    Status On-going                 Plan - 05/30/20 0917    Clinical Impression Statement Significant cues required for completion of isometrics that were added last session. Began Standing AAROM for flexion and abduction and updated HEP. She saw MD yesterday who told her she was free to strengthen with hand weights and that she also would not have to return to him unless she has a difficulty. Pt requested vasopneumatic device at end of session to reduce pain and soreness.  Patient will benefit from skilled therapeutic intervention in order to improve the following deficits and impairments:  Pain,Impaired UE functional use,Decreased strength,Decreased activity tolerance,Increased muscle spasms,Decreased range of motion  Visit Diagnosis: S/P left rotator cuff repair  Left shoulder pain, unspecified  chronicity  Muscle weakness (generalized)  Stiffness of left shoulder, not elsewhere classified     Problem List Patient Active Problem List   Diagnosis Date Noted  . Eustachian tube disorder, bilateral 05/28/2020  . Other injury of muscle(s) and tendon(s) of the rotator cuff of left shoulder, subsequent encounter 05/28/2020  . Varicose veins of bilateral lower extremities with pain 10/18/2019  . Hamstring strain 09/25/2019  . Vitamin D deficiency 06/19/2019  . Hyperlipidemia 03/13/2019  . Essential hypertension 03/13/2019  . PTSD (post-traumatic stress disorder) 03/13/2019  . History of substance abuse ([place]) 03/13/2019  . Overweight (BMI 25.0-29.9) 03/13/2019  . Cocaine abuse with cocaine-induced psychotic disorder ([place]) 07/09/2017  . MDD (major depressive disorder), recurrent severe, without psychosis ([place]) 07/09/2017    Dorene Ar, PTA 05/30/2020, 9:31 AM  [facility] [address] [place], [place], [address] Phone: [phone]   Fax:  [phone]  Name: JISELE PRICE MRN: [id number] Date of Birth: Aug 15, 1960

## 2020-06-04 ENCOUNTER — Other Ambulatory Visit: Payer: Self-pay

## 2020-06-04 ENCOUNTER — Ambulatory Visit: Payer: Medicaid Other

## 2020-06-04 DIAGNOSIS — M6281 Muscle weakness (generalized): Secondary | ICD-10-CM

## 2020-06-04 DIAGNOSIS — M25612 Stiffness of left shoulder, not elsewhere classified: Secondary | ICD-10-CM

## 2020-06-04 DIAGNOSIS — Z9889 Other specified postprocedural states: Secondary | ICD-10-CM

## 2020-06-04 DIAGNOSIS — M25512 Pain in left shoulder: Secondary | ICD-10-CM

## 2020-06-05 NOTE — Therapy (Signed)
[place], [place], [address] Phone: [phone]   Fax:  [phone]  Physical Therapy Treatment  Patient Details  Name: Tonya Mckenzie MRN: [id number] Date of Birth: February 05, 1961 Referring Provider (PT): Ophelia Charter, MD   Encounter Date: 06/04/2020   PT End of Session - 06/04/20 1544    Visit Number 8    Number of Visits 28    Date for PT Re-Evaluation 08/08/20    Authorization Type Medicaid [place] Access    Authorization Time Period 3 visits 04/29/20-05/19/20, Josem Kaufmann resubmitted and approved for 05/20/20-06/30/20    Authorization - Visit Number 2    Authorization - Number of Visits 12    PT Start Time 1530    PT Stop Time 1615    PT Time Calculation (min) 45 min    Activity Tolerance Patient tolerated treatment well    Behavior During Therapy [facility] for tasks assessed/performed           Past Medical History:  Diagnosis Date  . Allergic rhinitis   . Anxiety   . Bacterial vaginosis   . Chronic post-traumatic stress disorder (PTSD)   . Cocaine abuse ([place])   . Depression   . ETOH abuse   . GERD (gastroesophageal reflux disease)   . Hyperlipidemia   . Hypertension    off meds due to weight loss  . Marijuana abuse   . Uterine leiomyoma     Past Surgical History:  Procedure Laterality Date  . ABDOMINAL HYSTERECTOMY     left ovaries, took cervix.  Performed for heavy periods and anemia  . BICEPT TENODESIS Left 03/06/2020   Procedure: BICEPS TENODESIS;  Surgeon: Hiram Gash, MD;  Location: [place];  Service: Orthopedics;  Laterality: Left;  . CHOLECYSTECTOMY    . SHOULDER ARTHROSCOPY WITH ROTATOR CUFF REPAIR AND SUBACROMIAL DECOMPRESSION Left 03/06/2020   Procedure: SHOULDER ARTHROSCOPY WITH ROTATOR CUFF REPAIR AND SUBACROMIAL DECOMPRESSION PARTIAL ACROMIOPLASTY AND DISTAL CLAVICULECTOMY;  Surgeon: Hiram Gash, MD;  Location: [place];  Service: Orthopedics;  Laterality:  Left;  . thumb surgery     right  . TOE SURGERY Left   . TONSILLECTOMY    . TYMPANOSTOMY TUBE PLACEMENT      There were no vitals filed for this visit.   Subjective Assessment - 06/04/20 1537    Subjective Pt states she has had no or low pain (1/10) since the injection. She was primarily having pain at night, but that has resolved.    Patient Stated Goals For arm ot get total mobility like RT arm    Currently in Pain? No/denies    Pain Location Shoulder    Pain Orientation Left    Pain Descriptors / Indicators Aching    Pain Type Surgical pain    Pain Onset More than a month ago    Pain Frequency Occasional                             [facility] Adult PT Treatment/Exercise - 06/05/20 0001      Shoulder Exercises: Supine   Protraction Both;10 reps   2 sets   Protraction Weight (lbs) 1#    Horizontal ABduction Both;10 reps   2 sets   Theraband Level (Shoulder Horizontal ABduction) Level 1 (Yellow)    Other Supine Exercises L shoulder RS/alphabet      Shoulder Exercises: Sidelying   External Rotation Left;10 reps  2 sets   External Rotation Weight (lbs) 1    Internal Rotation Left;10 reps   2 sets   Internal Rotation Weight (lbs) 1      Shoulder Exercises: Standing   Flexion Left   attempted several reps with pt pt demonstrating a shoulder shrug and the ex was stopped   Extension Both;10 reps   2 sets   Theraband Level (Shoulder Extension) Level 1 (Yellow)    Row Both;10 reps   2 sets   Theraband Level (Shoulder Row) Level 1 (Yellow)      Shoulder Exercises: Pulleys   Flexion 2 minutes    Scaption 2 minutes                  PT Education - 06/05/20 1700    Education Details HEP for isotonic strengtheing. Pt to complete every other day.    Person(s) Educated Patient    Methods Explanation;Demonstration;Tactile cues;Verbal cues;Handout    Comprehension Verbalized understanding;Returned demonstration;Verbal cues required;Tactile cues required             PT Short Term Goals - 05/16/20 2221      PT SHORT TERM GOAL #1   Title She will be independent with initial HEP    Baseline initiated 05/06/20    Period Weeks    Status Achieved    Target Date 05/20/20      PT SHORT TERM GOAL #2   Title PROM will equal protocol limits of  flexion 140 degrees , abducion 60 degrees , ER 40 degrees at side. 05/06/20: L AAROM flexion =120d    Time 2    Period Weeks    Status Achieved    Target Date 05/20/20      PT SHORT TERM GOAL #3   Title Pain will remain in 1-3 level; 05/06/20:1-3/10    Baseline varies but generally 3 or less    Period Weeks    Status Achieved             PT Long Term Goals - 05/30/20 0911      PT LONG TERM GOAL #1   Title She will be indpendent with all HEP issued    Baseline needs significant cues for newest addiiton to HEP- isometrics    Time 14    Period Weeks    Status On-going      PT LONG TERM GOAL #2   Title She will have 90% AROm compared to RT arm.    Baseline AROM limited compared to AAROM    Time 14    Period Weeks    Status On-going      PT LONG TERM GOAL #3   Title She will have intermittant and only mild pain with full use of LT arm    Baseline no pain unless sleeping positions- better since injecton, not using arm fully yet    Time 14    Period Weeks    Status On-going      PT LONG TERM GOAL #4   Title She will return to normal use of LT arm for self care aand home tasks    Baseline lemited with AROM    Time 14    Period Weeks    Status On-going                 Plan - 06/05/20 1749    Clinical Impression Statement Isotonic strengthening exs with low resistance were initiated for rotator cuff and periscapular strengthening. Exs were added to  HEP with pt returning demonstration. Pt is to complete these exs every other day. Pain has been improved since receiving an injection to the L shoulder. Attempted shoulder flexion to 90d, but with significant shrugging this ex was stopped  for the time being. Pt reported no increase with shoulder pain after the PT session.    Examination-Activity Limitations Bathing;Hygiene/Grooming;Lift;Reach Overhead;Carry;Dressing    Examination-Participation Restrictions Laundry;Cleaning;Community Activity;Meal Prep;Driving    Stability/Clinical Decision Making Evolving/Moderate complexity    Clinical Decision Making Moderate    Rehab Potential Excellent    PT Frequency 2x / week    PT Duration --   13   PT Treatment/Interventions Passive range of motion;Manual techniques;Patient/family education;Therapeutic exercise;Cryotherapy;Therapeutic activities    PT Next Visit Plan Assess response to isotonic strengthening exs. Continue AAROM progressing to AROM exs as tolerated, manual/PROM. Ice if needed.    PT Home Exercise Plan [id number]    Consulted and Agree with Plan of Care Patient           Patient will benefit from skilled therapeutic intervention in order to improve the following deficits and impairments:  Pain,Impaired UE functional use,Decreased strength,Decreased activity tolerance,Increased muscle spasms,Decreased range of motion  Visit Diagnosis: S/P left rotator cuff repair  Left shoulder pain, unspecified chronicity  Muscle weakness (generalized)  Stiffness of left shoulder, not elsewhere classified     Problem List Patient Active Problem List   Diagnosis Date Noted  . Eustachian tube disorder, bilateral 05/28/2020  . Other injury of muscle(s) and tendon(s) of the rotator cuff of left shoulder, subsequent encounter 05/28/2020  . Varicose veins of bilateral lower extremities with pain 10/18/2019  . Hamstring strain 09/25/2019  . Vitamin D deficiency 06/19/2019  . Hyperlipidemia 03/13/2019  . Essential hypertension 03/13/2019  . PTSD (post-traumatic stress disorder) 03/13/2019  . History of substance abuse ([place]) 03/13/2019  . Overweight (BMI 25.0-29.9) 03/13/2019  . Cocaine abuse with cocaine-induced psychotic  disorder ([place]) 07/09/2017  . MDD (major depressive disorder), recurrent severe, without psychosis ([place]) 07/09/2017    Gar Ponto MS, PT 06/05/20 6:54 AM  [facility] [address] [place], [place], [address] Phone: [phone]   Fax:  [phone]  Name: Tonya Mckenzie MRN: [id number] Date of Birth: 1960/07/30

## 2020-06-11 ENCOUNTER — Ambulatory Visit: Payer: Medicaid Other | Admitting: Physical Therapy

## 2020-06-19 ENCOUNTER — Ambulatory Visit: Payer: Medicaid Other

## 2020-06-24 ENCOUNTER — Ambulatory Visit: Payer: Medicaid Other | Admitting: Physical Therapy

## 2020-06-24 ENCOUNTER — Other Ambulatory Visit: Payer: Self-pay

## 2020-06-24 ENCOUNTER — Encounter: Payer: Self-pay | Admitting: Physical Therapy

## 2020-06-24 DIAGNOSIS — Z9889 Other specified postprocedural states: Secondary | ICD-10-CM

## 2020-06-24 DIAGNOSIS — M6281 Muscle weakness (generalized): Secondary | ICD-10-CM

## 2020-06-24 DIAGNOSIS — M25612 Stiffness of left shoulder, not elsewhere classified: Secondary | ICD-10-CM

## 2020-06-24 DIAGNOSIS — M25512 Pain in left shoulder: Secondary | ICD-10-CM

## 2020-06-24 NOTE — Patient Instructions (Signed)
Access Code: [email or web address] URL: [email or web address] Date: 06/24/2020 Prepared by: Dawayne Cirri  Exercises Seated Shoulder Flexion Slide at Table Top with Forearm in Neutral - 1 x daily - 7 x weekly - 1 sets - 10 reps - 3 hold Seated Shoulder Scaption Slide at Table Top with Forearm in Neutral - 1 x daily - 7 x weekly - 1 sets - 10 reps - 3 hold Supine Shoulder Press with Dowel - 1 x daily - 7 x weekly - 1 sets - 10 reps - 3 hold Supine Shoulder External Rotation with Dowel - 1 x daily - 7 x weekly - 1 sets - 10 reps - 3 hold Supine Shoulder Flexion with Dowel - 1 x daily - 7 x weekly - 1 sets - 10 reps - 3 hold Isometric Shoulder Extension at Wall - 1 x daily - 7 x weekly - 1 sets - 5-10 reps - 5 hold Isometric Shoulder Abduction at Wall - 1 x daily - 7 x weekly - 1 sets - 5-10 reps - 5 hold Isometric Shoulder External Rotation at Wall - 1 x daily - 7 x weekly - 1 sets - 5-10 reps - 5 hold Isometric Shoulder Flexion at Wall - 1 x daily - 7 x weekly - 1 sets - 5-10 reps - 5 hold Standing Isometric Shoulder Internal Rotation with Towel Roll at Doorway - 1 x daily - 7 x weekly - 1 sets - 5-10 reps - 5 hold Standing Shoulder Abduction AAROM with Dowel - 1 x daily - 7 x weekly - 2 sets - 10 reps Standing shoulder flexion AAROM with dowel - 1 x daily - 7 x weekly - 2 sets - 10 reps Standing Shoulder Row with Anchored Resistance - 1 x daily - 7 x weekly - 2 sets - 10 reps - 2 hold Shoulder extension with resistance - Neutral - 1 x daily - 7 x weekly - 2 sets - 10 reps - 2 hold Supine Shoulder Alphabet - 1 x daily - 7 x weekly - 1 sets - 1 reps Supine Shoulder Horizontal Abduction with Resistance - 1 x daily - 7 x weekly - 2 sets - 10 reps - 2 hold Supine Scapular Protraction in Flexion with Dumbbells - 1 x daily - 7 x weekly - 2 sets - 10 reps - 2 hold Shoulder External Rotation with Anchored Resistance - 1 x daily - 7 x weekly - 2 sets - 10 reps Shoulder Internal Rotation with  Resistance - 1 x daily - 7 x weekly - 2 sets - 10 reps

## 2020-06-24 NOTE — Therapy (Signed)
[place], [place], [address] Phone: [phone]   Fax:  [phone]  Physical Therapy Treatment  Patient Details  Name: Tonya Mckenzie MRN: [id number] Date of Birth: 1960/06/25 Referring Provider (PT): Ophelia Charter, MD   Encounter Date: 06/24/2020   PT End of Session - 06/24/20 1328    Visit Number 9    Number of Visits 28    Date for PT Re-Evaluation 08/08/20    Authorization Type Medicaid [place] Access    Authorization Time Period 3 visits 04/29/20-05/19/20, auth resubmitted and approved for 05/20/20-06/30/20    Authorization - Visit Number 3    Authorization - Number of Visits 12    PT Start Time 1230    PT Stop Time 1315    PT Time Calculation (min) 45 min    Activity Tolerance Patient tolerated treatment well    Behavior During Therapy [facility] for tasks assessed/performed           Past Medical History:  Diagnosis Date  . Allergic rhinitis   . Anxiety   . Bacterial vaginosis   . Chronic post-traumatic stress disorder (PTSD)   . Cocaine abuse ([place])   . Depression   . ETOH abuse   . GERD (gastroesophageal reflux disease)   . Hyperlipidemia   . Hypertension    off meds due to weight loss  . Marijuana abuse   . Uterine leiomyoma     Past Surgical History:  Procedure Laterality Date  . ABDOMINAL HYSTERECTOMY     left ovaries, took cervix.  Performed for heavy periods and anemia  . BICEPT TENODESIS Left 03/06/2020   Procedure: BICEPS TENODESIS;  Surgeon: Hiram Gash, MD;  Location: [place];  Service: Orthopedics;  Laterality: Left;  . CHOLECYSTECTOMY    . SHOULDER ARTHROSCOPY WITH ROTATOR CUFF REPAIR AND SUBACROMIAL DECOMPRESSION Left 03/06/2020   Procedure: SHOULDER ARTHROSCOPY WITH ROTATOR CUFF REPAIR AND SUBACROMIAL DECOMPRESSION PARTIAL ACROMIOPLASTY AND DISTAL CLAVICULECTOMY;  Surgeon: Hiram Gash, MD;  Location: [place];  Service: Orthopedics;  Laterality:  Left;  . thumb surgery     right  . TOE SURGERY Left   . TONSILLECTOMY    . TYMPANOSTOMY TUBE PLACEMENT      There were no vitals filed for this visit.   Subjective Assessment - 06/24/20 1232    Subjective Pt states she is doing well. She has been going to the gym to workout and work on her shoulder exercises.    Pain Score 0-No pain    Pain Location Shoulder    Pain Orientation Left              [facility] PT Assessment - 06/24/20 0001      AROM   Right Shoulder Flexion 173 Degrees    Right Shoulder ABduction 175 Degrees    Left Shoulder Flexion 172 Degrees    Left Shoulder ABduction 156 Degrees                         [facility] Adult PT Treatment/Exercise - 06/24/20 0001      Shoulder Exercises: Supine   Protraction Left;20 reps    Protraction Weight (lbs) 1#    Horizontal ABduction 20 reps;Both    Theraband Level (Shoulder Horizontal ABduction) Level 2 (Red)    Other Supine Exercises L shoulder RS/alphabet      Shoulder Exercises: Standing   External Rotation Left;20 reps    Theraband  Level (Shoulder External Rotation) Level 3 (Green)    Internal Rotation Left;20 reps    Theraband Level (Shoulder Internal Rotation) Level 3 (Green)    Extension Both;10 reps   2 sets   Theraband Level (Shoulder Extension) Level 3 (Green)    Row Both   2 sets   Theraband Level (Shoulder Row) Level 3 (Green)      Shoulder Exercises: Pulleys   Flexion 2 minutes    Scaption 2 minutes      Manual Therapy   Passive ROM Lt shoulder all planes to tolerance                  PT Education - 06/24/20 1315    Education Details Updated HEP- added shoulder IR, ER    Person(s) Educated Patient    Methods Demonstration;Handout    Comprehension Verbalized understanding            PT Short Term Goals - 05/16/20 2221      PT SHORT TERM GOAL #1   Title She will be independent with initial HEP    Baseline initiated 05/06/20    Period Weeks    Status Achieved    Target  Date 05/20/20      PT SHORT TERM GOAL #2   Title PROM will equal protocol limits of  flexion 140 degrees , abducion 60 degrees , ER 40 degrees at side. 05/06/20: L AAROM flexion =120d    Time 2    Period Weeks    Status Achieved    Target Date 05/20/20      PT SHORT TERM GOAL #3   Title Pain will remain in 1-3 level; 05/06/20:1-3/10    Baseline varies but generally 3 or less    Period Weeks    Status Achieved             PT Long Term Goals - 06/24/20 1323      PT LONG TERM GOAL #1   Title She will be indpendent with all HEP issued    Time 14    Period Weeks    Status On-going    Target Date 08/08/20      PT LONG TERM GOAL #2   Title She will have 90% AROm compared to RT arm.    Baseline L shoulder flexion at 172 degrees compared to R at 173, L shoulder abduction at 156 compared to 175    Time 14    Status On-going    Target Date 08/08/20      PT LONG TERM GOAL #3   Title She will have intermittant and only mild pain with full use of LT arm    Baseline pt reports no pain while using L arm    Time 14    Period Weeks    Status Achieved      PT LONG TERM GOAL #4   Title She will return to normal use of LT arm for self care aand home tasks    Baseline Pt reports she is able to complete all self care and home task without pain    Time 14    Status Achieved                 Plan - 06/24/20 1320    Clinical Impression Statement Pt tolerated all exercises well today with no pain throughout. She met LTG's #3 and #4 and is making progress towards all other goals. Pt has increased her L shoulder ROM flexion and  abduction. Shoulder ER and IR to was added to pt's HEP. She reports feeling good following treatment.    PT Next Visit Plan Assess response to isotonic strengthening exs. Continue AAROM progressing to AROM exs as tolerated, manual/PROM. Ice if needed.    PT Home Exercise Plan [id number]           Patient will benefit from skilled therapeutic intervention in  order to improve the following deficits and impairments:  Pain,Impaired UE functional use,Decreased strength,Decreased activity tolerance,Increased muscle spasms,Decreased range of motion  Visit Diagnosis: S/P left rotator cuff repair  Left shoulder pain, unspecified chronicity  Muscle weakness (generalized)  Stiffness of left shoulder, not elsewhere classified     Problem List Patient Active Problem List   Diagnosis Date Noted  . Eustachian tube disorder, bilateral 05/28/2020  . Other injury of muscle(s) and tendon(s) of the rotator cuff of left shoulder, subsequent encounter 05/28/2020  . Varicose veins of bilateral lower extremities with pain 10/18/2019  . Hamstring strain 09/25/2019  . Vitamin D deficiency 06/19/2019  . Hyperlipidemia 03/13/2019  . Essential hypertension 03/13/2019  . PTSD (post-traumatic stress disorder) 03/13/2019  . History of substance abuse ([place]) 03/13/2019  . Overweight (BMI 25.0-29.9) 03/13/2019  . Cocaine abuse with cocaine-induced psychotic disorder ([place]) 07/09/2017  . MDD (major depressive disorder), recurrent severe, without psychosis ([place]) 07/09/2017    Dawayne Cirri, [place] 06/24/2020, 1:32 PM  [facility] [address] [place], [place], [address] Phone: [phone]   Fax:  [phone]  Name: Tonya Mckenzie MRN: [id number] Date of Birth: 09/05/1960

## 2020-06-26 ENCOUNTER — Encounter: Payer: Self-pay | Admitting: Physical Therapy

## 2020-06-26 ENCOUNTER — Other Ambulatory Visit: Payer: Self-pay

## 2020-06-26 ENCOUNTER — Ambulatory Visit: Payer: Medicaid Other | Admitting: Physical Therapy

## 2020-06-26 DIAGNOSIS — M6281 Muscle weakness (generalized): Secondary | ICD-10-CM

## 2020-06-26 DIAGNOSIS — M25612 Stiffness of left shoulder, not elsewhere classified: Secondary | ICD-10-CM

## 2020-06-26 DIAGNOSIS — M25512 Pain in left shoulder: Secondary | ICD-10-CM

## 2020-06-26 DIAGNOSIS — Z9889 Other specified postprocedural states: Secondary | ICD-10-CM | POA: Diagnosis not present

## 2020-06-26 NOTE — Therapy (Addendum)
[place], [place], [address] Phone: [phone]   Fax:  [phone]  Physical Therapy Treatment/Discharge  Patient Details  Name: Tonya Mckenzie MRN: [id number] Date of Birth: 06/10/1960 Referring Provider (PT): Ophelia Charter, MD   Encounter Date: 06/26/2020   PT End of Session - 06/26/20 1017     Visit Number 10    Number of Visits 28    Date for PT Re-Evaluation 08/08/20    Authorization Type Medicaid [place] Access    Authorization Time Period 3 visits 04/29/20-05/19/20, Josem Kaufmann resubmitted and approved for 05/20/20-06/30/20    Authorization - Visit Number 4    Authorization - Number of Visits 12    PT Start Time 0933    PT Stop Time 1014    PT Time Calculation (min) 41 min    Activity Tolerance Patient tolerated treatment well    Behavior During Therapy WFL for tasks assessed/performed             Past Medical History:  Diagnosis Date   Allergic rhinitis    Anxiety    Bacterial vaginosis    Chronic post-traumatic stress disorder (PTSD)    Cocaine abuse ([place])    Depression    ETOH abuse    GERD (gastroesophageal reflux disease)    Hyperlipidemia    Hypertension    off meds due to weight loss   Marijuana abuse    Uterine leiomyoma     Past Surgical History:  Procedure Laterality Date   ABDOMINAL HYSTERECTOMY     left ovaries, took cervix.  Performed for heavy periods and anemia   BICEPT TENODESIS Left 03/06/2020   Procedure: BICEPS TENODESIS;  Surgeon: Hiram Gash, MD;  Location: [place];  Service: Orthopedics;  Laterality: Left;   CHOLECYSTECTOMY     SHOULDER ARTHROSCOPY WITH ROTATOR CUFF REPAIR AND SUBACROMIAL DECOMPRESSION Left 03/06/2020   Procedure: SHOULDER ARTHROSCOPY WITH ROTATOR CUFF REPAIR AND SUBACROMIAL DECOMPRESSION PARTIAL ACROMIOPLASTY AND DISTAL CLAVICULECTOMY;  Surgeon: Hiram Gash, MD;  Location: [place];  Service: Orthopedics;  Laterality:  Left;   thumb surgery     right   TOE SURGERY Left    TONSILLECTOMY     TYMPANOSTOMY TUBE PLACEMENT      There were no vitals filed for this visit.   Subjective Assessment - 06/26/20 0935     Subjective Pt reports she is doing well and has no pain coming in.    Currently in Pain? No/denies    Pain Score 0-No pain    Pain Location Shoulder    Pain Orientation Left                [facility] PT Assessment - 06/26/20 0001       Strength   Right/Left Shoulder Left    Left Shoulder Flexion 3+/5    Left Shoulder ABduction 3+/5    Left Shoulder Internal Rotation 4/5    Left Shoulder External Rotation 5/5                           [facility] Adult PT Treatment/Exercise - 06/26/20 0001       Shoulder Exercises: Supine   Horizontal ABduction 20 reps;Both    Theraband Level (Shoulder Horizontal ABduction) Level 2 (Red)    Other Supine Exercises L shoulder RS/alphabet      Shoulder Exercises: Seated   Other Seated Exercises UBE L1 4 min, alternating directions  every minute      Shoulder Exercises: Standing   External Rotation Left;20 reps    Theraband Level (Shoulder External Rotation) Level 3 (Green)   towel to squeeze to keep elbow at side and cues for keeping elbow bent   Internal Rotation Left;20 reps    Theraband Level (Shoulder Internal Rotation) Level 3 (Green)   cues for keeping elbow in, added towel tow squeeze on side   Extension Both;20 reps   cues for slowing down   Theraband Level (Shoulder Extension) Level 3 (Green)    Row Both;20 reps   2 sets, cues for keeping elbows bent throughout exercise   Theraband Level (Shoulder Row) Level 3 (Green)    Other Standing Exercises standing cane flexion , abduction      Shoulder Exercises: Pulleys   Flexion 2 minutes    Scaption --    ABduction 2 minutes      Manual Therapy   Passive ROM Lt shoulder all planes to tolerance                      PT Short Term Goals - 05/16/20 2221       PT  SHORT TERM GOAL #1   Title She will be independent with initial HEP    Baseline initiated 05/06/20    Period Weeks    Status Achieved    Target Date 05/20/20      PT SHORT TERM GOAL #2   Title PROM will equal protocol limits of  flexion 140 degrees , abducion 60 degrees , ER 40 degrees at side. 05/06/20: L AAROM flexion =120d    Time 2    Period Weeks    Status Achieved    Target Date 05/20/20      PT SHORT TERM GOAL #3   Title Pain will remain in 1-3 level; 05/06/20:1-3/10    Baseline varies but generally 3 or less    Period Weeks    Status Achieved               PT Long Term Goals - 06/24/20 1323       PT LONG TERM GOAL #1   Title She will be indpendent with all HEP issued    Time 14    Period Weeks    Status On-going    Target Date 08/08/20      PT LONG TERM GOAL #2   Title She will have 90% AROm compared to RT arm.    Baseline L shoulder flexion at 172 degrees compared to R at 173, L shoulder abduction at 156 compared to 175    Time 14    Status On-going    Target Date 08/08/20      PT LONG TERM GOAL #3   Title She will have intermittant and only mild pain with full use of LT arm    Baseline pt reports no pain while using L arm    Time 14    Period Weeks    Status Achieved      PT LONG TERM GOAL #4   Title She will return to normal use of LT arm for self care aand home tasks    Baseline Pt reports she is able to complete all self care and home task without pain    Time 14    Status Achieved                   Plan - 06/26/20 1347  Clinical Impression Statement Pt tolerated all exercises well today with no pain throughout. Pt has weakness in L shoulder flexors, abductors and internal rotators and discussed with patient we will work on strengthening those muscles. She reported feeling good following treatment.    PT Next Visit Plan Assess response to isotonic strengthening exs. focus on strengthening shoulder flexors and abductors    PT Home  Exercise Plan [id number]             Patient will benefit from skilled therapeutic intervention in order to improve the following deficits and impairments:  Pain,Impaired UE functional use,Decreased strength,Decreased activity tolerance,Increased muscle spasms,Decreased range of motion  Visit Diagnosis: S/P left rotator cuff repair  Left shoulder pain, unspecified chronicity  Muscle weakness (generalized)  Stiffness of left shoulder, not elsewhere classified     Problem List Patient Active Problem List   Diagnosis Date Noted   Eustachian tube disorder, bilateral 05/28/2020   Other injury of muscle(s) and tendon(s) of the rotator cuff of left shoulder, subsequent encounter 05/28/2020   Varicose veins of bilateral lower extremities with pain 10/18/2019   Hamstring strain 09/25/2019   Vitamin D deficiency 06/19/2019   Hyperlipidemia 03/13/2019   Essential hypertension 03/13/2019   PTSD (post-traumatic stress disorder) 03/13/2019   History of substance abuse ([place]) 03/13/2019   Overweight (BMI 25.0-29.9) 03/13/2019   Cocaine abuse with cocaine-induced psychotic disorder ([place]) 07/09/2017   MDD (major depressive disorder), recurrent severe, without psychosis ([place]) 07/09/2017    Dawayne Cirri, SPTA 06/26/2020, [facility] [address] [place], [place], [address] Phone: [phone]   Fax:  [phone]  Name: DANA DEBO MRN: [id number] Date of Birth: 1960/10/29  PHYSICAL THERAPY DISCHARGE SUMMARY  Visits from Start of Care: 11  Current functional level related to goals / functional outcomes: See above   Remaining deficits: See above   Education / Equipment: HEP  Patient agrees to discharge. Patient goals were partially met. Patient is being discharged due to not returning since the last visit.  Allen Ralls MS, PT 09/09/20 5:05 PM

## 2020-06-30 ENCOUNTER — Ambulatory Visit: Payer: Self-pay

## 2020-07-03 ENCOUNTER — Ambulatory Visit: Payer: Medicaid Other | Attending: Orthopaedic Surgery | Admitting: Physical Therapy

## 2020-07-03 ENCOUNTER — Telehealth: Payer: Self-pay | Admitting: Physical Therapy

## 2020-07-03 NOTE — Telephone Encounter (Signed)
LVM regarding missed appointment today. I noted when her next scheduled appointment is, however she needs to be scheduled with  PT because she needs to be re-evaluated, and her next appointment is scheduled with a PTA. I noted that her appointment is on 5/13 at 11:45, but we have an opening at 10:15 which has been held for her to get in,but can only hold it for a limited period of time. Please call back to confirm or we can cancel her appointment on 5/13 and re-evaluate at the next visit.   Cyleigh Massaro PT, DPT, LAT, ATC  07/03/20  5:01 PM

## 2020-07-11 ENCOUNTER — Ambulatory Visit: Payer: Medicaid Other | Admitting: Physical Therapy

## 2020-07-15 ENCOUNTER — Telehealth: Payer: Self-pay | Admitting: Physical Therapy

## 2020-07-15 ENCOUNTER — Ambulatory Visit: Payer: Medicaid Other | Admitting: Physical Therapy

## 2020-07-15 NOTE — Telephone Encounter (Signed)
LVM regarding 2nd missed appointment today. I discussed that since this is her second consecutive missed appointment. Per clinic policy we are cancelling all her future appointments. If she would like to resume physical therapy to please call and we can schedule only 1 appointment at a time. If she has any questions to review the attendance policy provided at evaluation. If we do not hear back within 30 days of her last attended session then we will discharge from physical therapy.  Kazuto Sevey PT, DPT, LAT, ATC  07/15/20  9:09 AM

## 2020-07-17 ENCOUNTER — Ambulatory Visit: Payer: Medicaid Other

## 2020-07-22 ENCOUNTER — Encounter: Payer: Medicaid Other | Admitting: Physical Therapy

## 2020-08-08 ENCOUNTER — Encounter: Payer: Self-pay | Admitting: Family Medicine

## 2020-08-08 ENCOUNTER — Other Ambulatory Visit: Payer: Self-pay

## 2020-08-08 ENCOUNTER — Ambulatory Visit (INDEPENDENT_AMBULATORY_CARE_PROVIDER_SITE_OTHER): Payer: Medicaid Other | Admitting: Family Medicine

## 2020-08-08 ENCOUNTER — Ambulatory Visit: Payer: Self-pay

## 2020-08-08 VITALS — BP 130/80 | Ht 66.0 in | Wt 170.0 lb

## 2020-08-08 DIAGNOSIS — M25512 Pain in left shoulder: Secondary | ICD-10-CM | POA: Diagnosis not present

## 2020-08-08 MED ORDER — METHYLPREDNISOLONE ACETATE 40 MG/ML IJ SUSP
40.0000 mg | Freq: Once | INTRAMUSCULAR | Status: AC
  Administered 2020-08-08: 11:00:00 40 mg via INTRA_ARTICULAR

## 2020-08-08 NOTE — Progress Notes (Signed)
[facility]: Attending Note: I have reviewed the chart, discussed wit the Sports Medicine Fellow. I agree with assessment and treatment plan as detailed in the Fellow's note.  

## 2020-08-08 NOTE — Progress Notes (Signed)
   PCP: Cleophas Dunker, DO  Subjective:   HPI: Patient is a 60 y.o. female with history of left rotator cuff tendinopathy status post shoulder arthroscopy with debridement and biceps tenodesis on 03/06/2020 here for evaluation of anterior left shoulder pain.  She reports that she has had significant provement in her rotator cuff symptoms after surgery and feels like she was doing well with this until she developed this anterior shoulder pain.  She did not have any specific incident that incited her pain, rather it was progressive in onset.  The pain is in her anterior shoulder and extending somewhat down the anterior aspect of her arm.  She has tenderness when she palpates that area and is also noticed pain when she is holding objects close to her body or when trying to do any sort of bicep curl.  No numbness or tingling, no weakness, no trauma or fall.   Review of Systems:  Per HPI.   [place], medications and smoking status reviewed.      Objective:  Physical Exam:  No flowsheet data found.   Gen: awake, alert, NAD, comfortable in exam room Pulm: breathing unlabored  Left shoulder: -Inspection: no obvious deformity, atrophy, or asymmetry. No bruising. No swelling -Palpation: no TTP over [facility] joint, + tenderness over bicipital groove and distal to this. -ROM: Full ROM in abduction, flexion, internal/external rotation both passively and actively  NV intact distally Normal scapular function observed. Special Tests:  - Impingement: Neg Hawkins, Neg neers, Neg empty can sign. - Supraspinatus: Negative empty can.  5/5 strength with resisted flexion at 20 degrees - Infraspinatus/Teres Minor: 5/5 strength with ER - Subscapularis: 5/5 strength with IR - Biceps tendon: Positive speeds, positive Yergason's - Labrum: Negative Obriens, good stability - No painful arc and no drop arm sign    Assessment & Plan:  1.  Left biceps tendinopathy status post biceps tenodesis Patient with signs  and symptoms most consistent with biceps tendinopathy.  She did fairly recently have surgery with tenodesis, and with brief ultrasound this tenodesis is seen.  Her pain is mostly around the area of the tenodesis and just distal to it.  Given this, we discussed options and patient would like to proceed with biceps tendon corticosteroid injection which was performed today under ultrasound without complication.  We will follow-up in a few weeks to check back in and see how she is doing with this.  Procedure note: Risks and benefits of left biceps tendon peritendinous injection were discussed with patient.  We specifically discussed the possible increased risk of rupture.  She voiced her understanding and gave both verbal and written consent to proceed.  Prior to the procedure, correct location was identified after timeout was called.  Patient was laying on the examination table.  She was cleaned with alcohol swab and using sterile ultrasound technique the biceps tendon was identified in the bicipital groove.  She was then anesthetized with 2 cc of lidocaine and injected with 2 cc of lidocaine and 40 mg of Depo-Medrol into the peritendinous region without complication.  This was all well visualized under ultrasound, please see associated documentation for details.   Tonya Ligas, MD [facility] Fellow 08/08/2020 10:40 AM

## 2020-08-21 ENCOUNTER — Other Ambulatory Visit: Payer: Self-pay

## 2020-08-21 ENCOUNTER — Ambulatory Visit (INDEPENDENT_AMBULATORY_CARE_PROVIDER_SITE_OTHER): Payer: Medicaid Other | Admitting: Family Medicine

## 2020-08-21 VITALS — BP 118/82 | HR 75 | Ht 66.0 in | Wt 167.0 lb

## 2020-08-21 DIAGNOSIS — R748 Abnormal levels of other serum enzymes: Secondary | ICD-10-CM | POA: Diagnosis not present

## 2020-08-21 DIAGNOSIS — I10 Essential (primary) hypertension: Secondary | ICD-10-CM

## 2020-08-21 DIAGNOSIS — R779 Abnormality of plasma protein, unspecified: Secondary | ICD-10-CM | POA: Diagnosis not present

## 2020-08-21 DIAGNOSIS — B373 Candidiasis of vulva and vagina: Secondary | ICD-10-CM

## 2020-08-21 DIAGNOSIS — E785 Hyperlipidemia, unspecified: Secondary | ICD-10-CM | POA: Diagnosis not present

## 2020-08-21 DIAGNOSIS — R1013 Epigastric pain: Secondary | ICD-10-CM | POA: Diagnosis not present

## 2020-08-21 DIAGNOSIS — B3731 Acute candidiasis of vulva and vagina: Secondary | ICD-10-CM | POA: Insufficient documentation

## 2020-08-21 MED ORDER — FLUCONAZOLE 150 MG PO TABS: 150.0000 mg | ORAL_TABLET | Freq: Once | ORAL | 0 refills | Status: AC

## 2020-08-21 MED ORDER — PANTOPRAZOLE SODIUM 40 MG PO TBEC: 40.0000 mg | DELAYED_RELEASE_TABLET | Freq: Every day | ORAL | 1 refills | Status: DC

## 2020-08-21 NOTE — Progress Notes (Addendum)
    SUBJECTIVE:   CHIEF COMPLAINT / HPI:   Elevated Serum Alpha 1 Went to donate plasma and was told on two occasions that she had elevated alpha 1 proteins in her blood and needed to see her doctor  Epigastric/LUQ Pain Comes and goes for the last few years Worsens after drinking soda or coffee Feels like a burning sensation Had EGD that was normal in 2018 Seen by GI on 08/22/2018  Hyperlipidemia Current regimen: Crestor 40 mg daily, Zetia 10 mg daily Last LDL on 08/02/2019 was 64 Father had an MI at age of 75, goal LDL less than 100  Hypertension Current regimen: Norvasc 5 mg daily BP at home: usually 107-108 SBP Last CMP January 2022, creatinine slightly increased to 1.17  Elevated AST and ALT On CMP in January, AST 59, ALT 111  Yeast Vaginitis Having vaginal itching that feels like a yeast infection Mentioned at end of visit.   PERTINENT  PMH / PSH: HTN, MDD, PTSD, HLD, prior history of substance abuse  OBJECTIVE:   BP 118/82   Pulse 75   Ht 5\' 6"  (1.676 m)   Wt 167 lb (75.8 kg)   SpO2 99%   BMI 26.95 kg/m    Physical Exam:  General: 60 y.o. female in NAD Lungs: Breathing comfortably on room air Abdomen: Soft, mild TTP epigastric, no LUQ tenderness, no pain with abdominal wall flexion, non-distended, positive bowel sounds Skin: warm and dry Extremities: No edema   ASSESSMENT/PLAN:   Elevated serum protein level Incidental finding with plasma donation.  Unclear reason for obtaining results in the first place.  We will start with SPEP and can perform further testing if needed.  Reassuringly, her CMP in January had normal albumin and protein levels.  Obtaining CBC as well.  Essential hypertension Well-controlled.  Obtain labs today.  Continue Norvasc.  Hyperlipidemia Stable, obtaining lipid panel today.  Continue Crestor and Zetia.  Goal less than 100.  Epigastric abdominal pain Does not seem to be related to abdominal wall.  She had a normal EGD in  2018.  Given that her symptoms occur after acidic beverages, I can consider GERD.  Will trial Protonix for 2 months and she will follow-up with new PCP.  Elevated liver enzymes Noted on CMP in January.  Recheck today.  She has a prior history of substance abuse, however had a negative hepatitis C antibody recently.  Yeast vaginitis Brought up at end of visit.  Will treat with diflucan x1.   She will go to pharmacy for Shingrix vaccination.  Tonya Mckenzie, Tonya Mckenzie

## 2020-08-21 NOTE — Assessment & Plan Note (Addendum)
Incidental finding with plasma donation.  Unclear reason for obtaining results in the first place.  We will start with SPEP and can perform further testing if needed.  Reassuringly, her CMP in January had normal albumin and protein levels.  Obtaining CBC as well.

## 2020-08-21 NOTE — Addendum Note (Signed)
Addended by: Cleophas Dunker on: 08/21/2020 11:53 AM   Modules accepted: Orders

## 2020-08-21 NOTE — Assessment & Plan Note (Signed)
Does not seem to be related to abdominal wall.  She had a normal EGD in 2018.  Given that her symptoms occur after acidic beverages, I can consider GERD.  Will trial Protonix for 2 months and she will follow-up with new PCP.

## 2020-08-21 NOTE — Assessment & Plan Note (Addendum)
Noted on CMP in January.  Recheck today.  She has a prior history of substance abuse, however had a negative hepatitis C antibody recently.

## 2020-08-21 NOTE — Patient Instructions (Signed)
Thank you for coming to see me today. It was a pleasure. Today we talked about:   We will get some labs today.  If they are abnormal or we need to do something about them, I will call you.  If they are normal, I will send you a message on MyChart (if it is active) or a letter in the mail.  If you don't hear from [place] in 2 weeks, please call the office at the number below.   We will try protonix to see if this helps your pain.  Please follow-up with your new PCP in 2 months.  If you have any questions or concerns, please do not hesitate to call the office at [phone].  Best,   [place]zona Constable, DO

## 2020-08-21 NOTE — Assessment & Plan Note (Signed)
Stable, obtaining lipid panel today.  Continue Crestor and Zetia.  Goal less than 100.

## 2020-08-21 NOTE — Assessment & Plan Note (Signed)
Well-controlled.  Obtain labs today.  Continue Norvasc.

## 2020-08-21 NOTE — Assessment & Plan Note (Signed)
Brought up at end of visit.  Will treat with diflucan x1.

## 2020-08-22 ENCOUNTER — Telehealth: Payer: Self-pay | Admitting: *Deleted

## 2020-08-22 NOTE — Telephone Encounter (Signed)
Received fax requesting a 90 day supply of pantoprazole 40 mg tablets. Tonya Mckenzie, CMA

## 2020-08-23 LAB — LIPID PANEL
Chol/HDL Ratio: 2.4 ratio (ref 0.0–4.4)
Cholesterol, Total: 207 mg/dL — ABNORMAL HIGH (ref 100–199)
HDL: 86 mg/dL (ref >39)
LDL Chol Calc (NIH): 108 mg/dL — ABNORMAL HIGH (ref 0–99)
Triglycerides: 75 mg/dL (ref 0–149)
VLDL Cholesterol Cal: 13 mg/dL (ref 5–40)

## 2020-08-23 LAB — COMPREHENSIVE METABOLIC PANEL
ALT: 67 IU/L — ABNORMAL HIGH (ref 0–32)
AST: 36 IU/L (ref 0–40)
Albumin/Globulin Ratio: 2.4 — ABNORMAL HIGH (ref 1.2–2.2)
Albumin: 4.8 g/dL (ref 3.8–4.9)
Alkaline Phosphatase: 119 IU/L (ref 44–121)
BUN/Creatinine Ratio: 14 (ref 9–23)
BUN: 14 mg/dL (ref 6–24)
Bilirubin Total: 0.4 mg/dL (ref 0.0–1.2)
CO2: 21 mmol/L (ref 20–29)
Calcium: 9.7 mg/dL (ref 8.7–10.2)
Chloride: 106 mmol/L (ref 96–106)
Creatinine, Ser: 0.99 mg/dL (ref 0.57–1.00)
Globulin, Total: 2 g/dL (ref 1.5–4.5)
Glucose: 86 mg/dL (ref 65–99)
Potassium: 4.3 mmol/L (ref 3.5–5.2)
Sodium: 142 mmol/L (ref 134–144)
Total Protein: 6.8 g/dL (ref 6.0–8.5)
eGFR: 66 mL/min/{1.73_m2} (ref >59)

## 2020-08-23 LAB — CBC WITH DIFFERENTIAL/PLATELET
Basophils Absolute: 0 10*3/uL (ref 0.0–0.2)
Basos: 1 %
EOS (ABSOLUTE): 0.1 10*3/uL (ref 0.0–0.4)
Eos: 1 %
Hematocrit: 43.8 % (ref 34.0–46.6)
Hemoglobin: 14.5 g/dL (ref 11.1–15.9)
Immature Grans (Abs): 0 10*3/uL (ref 0.0–0.1)
Immature Granulocytes: 0 %
Lymphocytes Absolute: 1.9 10*3/uL (ref 0.7–3.1)
Lymphs: 23 %
MCH: 28.9 pg (ref 26.6–33.0)
MCHC: 33.1 g/dL (ref 31.5–35.7)
MCV: 87 fL (ref 79–97)
Monocytes Absolute: 0.7 10*3/uL (ref 0.1–0.9)
Monocytes: 8 %
Neutrophils Absolute: 5.5 10*3/uL (ref 1.4–7.0)
Neutrophils: 67 %
Platelets: 246 10*3/uL (ref 150–450)
RBC: 5.01 x10E6/uL (ref 3.77–5.28)
RDW: 12 % (ref 11.7–15.4)
WBC: 8.2 10*3/uL (ref 3.4–10.8)

## 2020-08-23 LAB — PROTEIN ELECTROPHORESIS, SERUM
A/G Ratio: 1.4 (ref 0.7–1.7)
Albumin ELP: 4 g/dL (ref 2.9–4.4)
Alpha 1: 0.2 g/dL (ref 0.0–0.4)
Alpha 2: 0.9 g/dL (ref 0.4–1.0)
Beta: 1.1 g/dL (ref 0.7–1.3)
Gamma Globulin: 0.6 g/dL (ref 0.4–1.8)
Globulin, Total: 2.8 g/dL (ref 2.2–3.9)

## 2020-08-25 ENCOUNTER — Encounter: Payer: Self-pay | Admitting: Family Medicine

## 2020-08-25 NOTE — Telephone Encounter (Signed)
This is a trial Rx.  Not sure yet if will need for 3 months.  Will not send.

## 2020-08-29 ENCOUNTER — Ambulatory Visit: Payer: Medicaid Other | Admitting: Family Medicine

## 2020-09-16 ENCOUNTER — Other Ambulatory Visit: Payer: Self-pay

## 2020-09-16 ENCOUNTER — Ambulatory Visit (HOSPITAL_COMMUNITY)
Admission: EM | Admit: 2020-09-16 | Discharge: 2020-09-16 | Disposition: A | Discharge status: Home / Self Care | Payer: Medicaid Other | Attending: Emergency Medicine | Admitting: Emergency Medicine

## 2020-09-16 ENCOUNTER — Encounter (HOSPITAL_COMMUNITY): Payer: Self-pay

## 2020-09-16 DIAGNOSIS — M25561 Pain in right knee: Secondary | ICD-10-CM | POA: Diagnosis not present

## 2020-09-16 MED ORDER — KETOROLAC TROMETHAMINE 30 MG/ML IJ SOLN
INTRAMUSCULAR | Status: AC
  Filled 2020-09-16: qty 1

## 2020-09-16 MED ORDER — KETOROLAC TROMETHAMINE 30 MG/ML IJ SOLN
30.0000 mg | Freq: Once | INTRAMUSCULAR | Status: AC
Start: 2020-09-16 — End: 2020-09-16
  Administered 2020-09-16: 30 mg via INTRAMUSCULAR

## 2020-09-16 MED ORDER — PREDNISONE 20 MG PO TABS: 40.0000 mg | ORAL_TABLET | Freq: Every day | ORAL | 0 refills | Status: DC

## 2020-09-16 MED ORDER — MELOXICAM 7.5 MG PO TABS: 7.5000 mg | ORAL_TABLET | Freq: Every day | ORAL | 0 refills | Status: DC

## 2020-09-16 NOTE — ED Triage Notes (Signed)
Pt c/o right knee pain for about a week.   Interventions: motrin- no help

## 2020-09-16 NOTE — ED Provider Notes (Signed)
[place]    CSN: [id number] Arrival date & time: 09/16/20  1051      History   Chief Complaint Chief Complaint  Patient presents with   Knee Pain    HPI Tonya Mckenzie is a 60 y.o. female.   Patient presents with right knee for 1 week after hitting knee on the edge of a table. Painful when standing and walking, pain felt with bending but not as bad. Denies numbness and tingling, prior injury. Has been taking tylenol and ibuprofen with no relief.   Past Medical History:  Diagnosis Date   Allergic rhinitis    Anxiety    Bacterial vaginosis    Chronic post-traumatic stress disorder (PTSD)    Cocaine abuse ([place])    Depression    ETOH abuse    GERD (gastroesophageal reflux disease)    Hyperlipidemia    Hypertension    off meds due to weight loss   Marijuana abuse    Uterine leiomyoma     Patient Active Problem List   Diagnosis Date Noted   Elevated liver enzymes 08/21/2020   Elevated serum protein level 08/21/2020   Yeast vaginitis 08/21/2020   Eustachian tube disorder, bilateral 05/28/2020   Other injury of muscle(s) and tendon(s) of the rotator cuff of left shoulder, subsequent encounter 05/28/2020   Varicose veins of bilateral lower extremities with pain 10/18/2019   Vitamin D deficiency 06/19/2019   Epigastric abdominal pain 05/15/2019   Hyperlipidemia 03/13/2019   Essential hypertension 03/13/2019   PTSD (post-traumatic stress disorder) 03/13/2019   History of substance abuse ([place]) 03/13/2019   Overweight (BMI 25.0-29.9) 03/13/2019   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  Performed for heavy periods and anemia   BICEPT TENODESIS Left 03/06/2020   Procedure: BICEPS TENODESIS;  Surgeon: Hiram Gash, MD;  Location: [facility];  Service: Orthopedics;  Laterality: Left;   CHOLECYSTECTOMY     SHOULDER ARTHROSCOPY WITH ROTATOR CUFF REPAIR AND SUBACROMIAL DECOMPRESSION Left 03/06/2020   Procedure: SHOULDER ARTHROSCOPY WITH ROTATOR CUFF REPAIR AND SUBACROMIAL DECOMPRESSION PARTIAL ACROMIOPLASTY AND DISTAL CLAVICULECTOMY;  Surgeon: Hiram Gash, MD;  Location: [facility];  Service: Orthopedics;  Laterality: Left;   thumb surgery     right   TOE SURGERY Left    TONSILLECTOMY     TYMPANOSTOMY TUBE PLACEMENT      OB History     Gravida  4   Para  4   Term      Preterm      AB      Living         SAB      IAB      Ectopic      Multiple      Live Births               Home Medications    Prior to Admission medications   Medication Sig Start Date End Date Taking? Authorizing Provider  meloxicam (MOBIC) 7.5 MG tablet Take 1 tablet (7.5 mg total) by mouth daily. 09/16/20  Yes Brittanyann Wittner R, NP  predniSONE (DELTASONE) 20 MG tablet Take 2 tablets (40 mg total) by mouth daily. 09/16/20  Yes Aly Seidenberg R, NP  amLODipine (NORVASC) 5 MG tablet TAKE 1 TABLET(5  MG) BY MOUTH AT BEDTIME. START TAKING WHEN YOU DECREASE YOUR CLONIDINE TO EVERY DAY 05/22/20   Lurline Del, DO  ezetimibe (ZETIA) 10 MG tablet Take 1 tablet (10 mg total) by mouth daily. 08/02/19   Meccariello, Bernita Raisin, DO  gabapentin (NEURONTIN) 100 MG capsule Take 1 capsule (100 mg total) by mouth 2 (two) times daily for 14 days. For pain. 03/06/20 03/20/20  Ethelda Chick, PA-C  Multiple Vitamins-Minerals (WOMENS MULTIVITAMIN PO) Take by mouth.    [provider]  pantoprazole (PROTONIX) 40 MG tablet Take 1 tablet (40 mg total) by mouth daily. 08/21/20   Meccariello, Bernita Raisin, DO  rosuvastatin (CRESTOR) 40 MG tablet Take 1 tablet (40 mg total) by mouth daily. 05/19/20   Meccariello, Bernita Raisin, DO  Vitamin D, Ergocalciferol, (DRISDOL) 1.25 MG (50000 UT) CAPS capsule TK 1 C PO ONCE A WEEK 08/25/18    [provider]    Family History Family History  Problem Relation Age of Onset   Diabetes Mother    Hypertension Mother    Hypercholesterolemia Mother    Heart attack Father    Heart attack Brother    Colon cancer Neg Hx    Esophageal cancer Neg Hx    Rectal cancer Neg Hx    Stomach cancer Neg Hx    Liver cancer Neg Hx    Breast cancer Neg Hx     Social History Social History   Tobacco Use   Smoking status: Former    Types: Cigarettes   Smokeless tobacco: Never   Tobacco comments:    never really smoked, when she did was about 1 cigarette a month  Vaping Use   Vaping Use: Never used  Substance Use Topics   Alcohol use: Not Currently    Comment: quit drinking 2016   Drug use: Not Currently    Types: Cocaine, Marijuana    Comment: reformed went through rehab     Allergies   Patient has no known allergies.   Review of Systems Review of Systems  Constitutional: Negative.   Respiratory: Negative.    Cardiovascular: Negative.   Skin: Negative.   Neurological: Negative.     Physical Exam Triage Vital Signs ED Triage Vitals  Enc Vitals Group     BP 09/16/20 1205 130/80     Pulse Rate 09/16/20 1205 62     Resp 09/16/20 1205 16     Temp 09/16/20 1205 99.2 F (37.3 C)     Temp Source 09/16/20 1205 Oral     SpO2 09/16/20 1205 98 %     Weight --      Height --      Head Circumference --      Peak Flow --      Pain Score 09/16/20 1203 10     Pain Loc --      Pain Edu? --      Excl. in [place]? --    No data found.  Updated Vital Signs BP 130/80 (BP Location: Right Arm)   Pulse 62   Temp 99.2 F (37.3 C) (Oral)   Resp 16   SpO2 98%   Visual Acuity Right Eye Distance:   Left Eye Distance:   Bilateral Distance:    Right Eye Near:   Left Eye Near:    Bilateral Near:     Physical Exam Constitutional:      Appearance: Normal appearance. She is normal weight.  HENT:     Head: Normocephalic.  Eyes:  Extraocular Movements: Extraocular  movements intact.  Pulmonary:     Effort: Pulmonary effort is normal.  Musculoskeletal:     Comments: Small joint effusion in right knee palpated, mild swelling and diffuse tenderness over anterior of knee, no point tenderness noted, bruising present over lateral anterior knee, 2+ popliteal pulse  Skin:    General: Skin is warm and dry.  Neurological:     General: No focal deficit present.     Mental Status: She is alert and oriented to person, place, and time. Mental status is at baseline.  Psychiatric:        Mood and Affect: Mood normal.        Behavior: Behavior normal.     UC Treatments / Results  Labs (all labs ordered are listed, but only abnormal results are displayed) Labs Reviewed - No data to display  EKG   Radiology No results found.  Procedures Procedures (including critical care time)  Medications Ordered in UC Medications  ketorolac (TORADOL) 30 MG/ML injection 30 mg (30 mg Intramuscular Given 09/16/20 1226)    Initial Impression / Assessment and Plan / UC Course  I have reviewed the triage vital signs and the nursing notes.  Pertinent labs & imaging results that were available during my care of the patient were reviewed by me and considered in my medical decision making (see chart for details).  Acute pain of right knee  Toradol 30 mg IM now Prednisone 40 mg daily for 5 days, meloxicam 7.5 daily for 5 days at completion of steroid then prn Ortho follow up for persistent pain  Final Clinical Impressions(s) / UC Diagnoses   Final diagnoses:  Acute pain of right knee     Discharge Instructions      Take prednisone daily for the next 5 days, once completed start meloxicam daily for 5 days then as needed  Can use tylenol 650 mg every 6 hours for pain during the day  Heating pad 15 minute intervals   Gentle stretching as tolerated    ED Prescriptions     Medication Sig Dispense Auth. Provider   predniSONE (DELTASONE) 20 MG tablet Take 2  tablets (40 mg total) by mouth daily. 10 tablet Davonda Ausley, Vincente Liberty R, NP   meloxicam (MOBIC) 7.5 MG tablet Take 1 tablet (7.5 mg total) by mouth daily. 30 tablet Hans Eden, NP      PDMP not reviewed this encounter.   Hans Eden, NP 09/16/20 1239

## 2020-09-16 NOTE — Discharge Instructions (Addendum)
Take prednisone daily for the next 5 days, once completed start meloxicam daily for 5 days then as needed  Can use tylenol 650 mg every 6 hours for pain during the day  Heating pad 15 minute intervals   Gentle stretching as tolerated

## 2020-09-17 ENCOUNTER — Other Ambulatory Visit: Payer: Self-pay | Admitting: Family Medicine

## 2020-09-17 DIAGNOSIS — R1013 Epigastric pain: Secondary | ICD-10-CM

## 2020-09-18 ENCOUNTER — Ambulatory Visit: Payer: Medicaid Other

## 2020-10-09 ENCOUNTER — Other Ambulatory Visit: Payer: Self-pay

## 2020-10-09 ENCOUNTER — Encounter: Payer: Self-pay | Admitting: Sports Medicine

## 2020-10-09 ENCOUNTER — Ambulatory Visit (INDEPENDENT_AMBULATORY_CARE_PROVIDER_SITE_OTHER): Payer: Medicaid Other | Admitting: Sports Medicine

## 2020-10-09 VITALS — BP 118/80 | Ht 66.0 in | Wt 170.0 lb

## 2020-10-09 DIAGNOSIS — M25561 Pain in right knee: Secondary | ICD-10-CM

## 2020-10-09 MED ORDER — METHYLPREDNISOLONE ACETATE 40 MG/ML IJ SUSP
40.0000 mg | Freq: Once | INTRAMUSCULAR | Status: AC
  Administered 2020-10-09: 40 mg via INTRA_ARTICULAR

## 2020-10-09 NOTE — Progress Notes (Signed)
   Subjective:    Patient ID: Tonya Mckenzie, female    DOB: April 20, 1960, 60 y.o.   MRN: [id number]  HPI chief complaint: Right knee pain  Patient presents complaining of right knee pain for approximately 2 weeks.  She enjoys walking on a treadmill for exercise.  This is something she has done for several years.  She enjoys walking 3 to 4 miles up to 4 times a week.  She began to experience pain 1 day after walking on the treadmill.  Pain was severe enough that it was awakening her at night.  She localizes her pain along the medial knee.  It is worse with weightbearing.  She describes it as achy in quality.  It will radiate to the posterior aspect of the knee.  No significant mechanical symptoms.  She denies previous problems with this knee although review of the chart shows that she did suffer a knee contusion earlier in July for which she was seen at a local urgent care.  She has been taking Tylenol without any relief in symptoms.  No prior knee surgeries.  She did notice some mild swelling along the medial knee initially.  Interim medical history reviewed Medications reviewed Allergies reviewed    Review of Systems As above    Objective:   Physical Exam  Well-developed, well-nourished.  No acute distress  Right knee: Full range of motion.  No obvious effusion.  She is tender to palpation along the medial joint line with a positive Thessaly's.  No tenderness to palpation along the lateral joint line.  Negative McMurray's.  Knee is stable to valgus and varus stressing.  Negative anterior drawer, negative posterior drawer.  No significant patellofemoral crepitus.  No tenderness over the pes anserine bursa.  Neurovascularly intact distally.      Assessment & Plan:   Medial right knee pain likely secondary to mild DJD versus degenerative meniscal injury  Patient's right knee is injected with cortisone today.  This is done atraumatically under sterile technique utilizing an anterior lateral  approach.  I also recommended a compression sleeve with exercise going forward.  I discussed daily ice massage until pain improves.  We will schedule a tentative follow-up in 3 to 4 weeks which she may cancel if her pain resolves.  If symptoms persist, consider imaging.

## 2020-10-16 ENCOUNTER — Other Ambulatory Visit: Payer: Self-pay

## 2020-10-16 DIAGNOSIS — E785 Hyperlipidemia, unspecified: Secondary | ICD-10-CM

## 2020-10-16 MED ORDER — EZETIMIBE 10 MG PO TABS: 10.0000 mg | ORAL_TABLET | Freq: Every day | ORAL | 3 refills | Status: DC

## 2020-10-20 ENCOUNTER — Other Ambulatory Visit: Payer: Self-pay

## 2020-10-20 ENCOUNTER — Ambulatory Visit (INDEPENDENT_AMBULATORY_CARE_PROVIDER_SITE_OTHER): Payer: Medicaid Other

## 2020-10-20 ENCOUNTER — Ambulatory Visit (INDEPENDENT_AMBULATORY_CARE_PROVIDER_SITE_OTHER): Payer: Medicaid Other | Admitting: Family Medicine

## 2020-10-20 ENCOUNTER — Encounter: Payer: Self-pay | Admitting: Family Medicine

## 2020-10-20 VITALS — BP 121/81 | HR 66 | Ht 66.0 in | Wt 171.4 lb

## 2020-10-20 DIAGNOSIS — Z23 Encounter for immunization: Secondary | ICD-10-CM | POA: Diagnosis not present

## 2020-10-20 DIAGNOSIS — M79604 Pain in right leg: Secondary | ICD-10-CM

## 2020-10-20 DIAGNOSIS — M79605 Pain in left leg: Secondary | ICD-10-CM | POA: Diagnosis not present

## 2020-10-20 NOTE — Progress Notes (Signed)
    SUBJECTIVE:   CHIEF COMPLAINT / HPI:   Bilateral Leg pain: 60 year old female presenting with bilateral posterior leg pain which started about 3 months ago.Marland Kitchen She was evaluated by sports medicine on 8/11 with medial right knee pain thought likely secondary to mild degenerative disc disease versus degenerative meniscal injury.  She was given a cortisone injection.  She was recommended to use a compression sleeve with exercise going forward and use daily ice massage until pain improves.  She had follow-up plan for 3 to 4 weeks with recommendation to consider imaging if it does not improve.  Today she states that she had the posterior pain at the time of her knee injection but didn't mention it. She states that getting on the exercise bike improves the pain but it comes back the next day. She states it ranges from her hamstrings all the way down to her calves that feels like an ache or like "soreness after a workout". She is working out 3-4x a week.  PERTINENT  PMH / PSH: None relevant  OBJECTIVE:   BP 121/81   Pulse 66   Ht '5\' 6"'$  (1.676 m)   Wt 171 lb 6.4 oz (77.7 kg)   SpO2 100%   BMI 27.66 kg/m    General: NAD, pleasant, able to participate in exam Cardiac: RRR, no murmurs. Respiratory: CTAB, normal effort, No wheezes, rales or rhonchi MSK: No single area of pain in the bilateral hamstrings or calves.  Bilateral quadriceps tendon, patellar tendon, joint line on the medial lateral aspect, and posterior knee have no pain to palpation.  Varus and valgus testing negative.  Negative straight leg raise testing.  No pain to palpation of the midline spine.  No pain with palpation of the muscles lateral to the lumbar spine. Psych: Normal affect and mood  ASSESSMENT/PLAN:   Bilateral posterior leg pain: Overall differential can include tightness after exercise.  No pain shooting down the legs and negative straight leg raise testing suggest against a lumbar etiology.  Patient endorses that she  does normally have a lot of flexibility but is not doing as much stretching as she may benefit from doing.  Exercises improving her symptoms.  She does not have any signs suggesting against an inflammatory condition of the cause of her pain, this is reaffirmed by the fact that the pain seems to be in the muscles and seems to be more of a "soreness as if she finished a workout".  We will have her try using a foam roller and stretching which I demonstrated in office today at least 2-3 times per day for the next 2 weeks and follow-up if it does not improve her symptoms.  Other causes can be her shoes which I discussed with her may be the next steps to purchase a new pair of shoes for better gait control.  Reassuringly she states that her knee pain which took her to sports medicine recently has resolved.  Lurline Del, [place]

## 2020-10-20 NOTE — Patient Instructions (Signed)
It was great to see you today!  I believe that the pain that you are having in your legs is due to muscle tightness that may be worsened by your exercise.  I recommend that you try the hamstring stretch that I demonstrated today at least 3 times per day, holding for 30 seconds each time for the next 2 weeks.  I do think that you would benefit from a foam roller which she can purchase from any sporting goods store such as [employer] [employer] or even [employer].  I recommend that she use this underneath your hamstrings as I demonstrated today as well as underneath your calves at least once to twice per day.  I expect this will improve your symptoms over the next 2 weeks.  If you do this consistently for 2 weeks and do not get a benefit please let us know.  If your pain changes or worsens please let us know.

## 2020-10-30 ENCOUNTER — Ambulatory Visit: Payer: Medicaid Other | Admitting: Sports Medicine

## 2020-10-31 ENCOUNTER — Ambulatory Visit: Payer: Self-pay

## 2020-10-31 ENCOUNTER — Ambulatory Visit: Payer: Medicaid Other | Admitting: Family Medicine

## 2020-10-31 ENCOUNTER — Other Ambulatory Visit: Payer: Self-pay

## 2020-10-31 VITALS — Ht 66.0 in | Wt 168.0 lb

## 2020-10-31 DIAGNOSIS — M25561 Pain in right knee: Secondary | ICD-10-CM | POA: Insufficient documentation

## 2020-10-31 DIAGNOSIS — G8929 Other chronic pain: Secondary | ICD-10-CM | POA: Insufficient documentation

## 2020-10-31 MED ORDER — MELOXICAM 15 MG PO TABS: 15.0000 mg | ORAL_TABLET | Freq: Every day | ORAL | 0 refills | Status: DC

## 2020-10-31 MED ORDER — MELOXICAM 15 MG PO TBDP: ORAL_TABLET | ORAL | 0 refills | Status: DC

## 2020-10-31 NOTE — Patient Instructions (Signed)
I have put in some orders for x rays. Please get those and we will follow up on the images when I see you back  Lets try NOT walking on the treadmill for 2 or 3 weeks. You can do daily walking that is not for exercise. You CAn use stationary bike.  Wear the compression sleeve. I will call in some meloxicam--take it once a day but do not take any OTC meds except tylenol with it  Come back in 2-4 weeks if you are not better and we will consider aspiration

## 2020-10-31 NOTE — Assessment & Plan Note (Signed)
I suspect she has some DJD in this knee.  Sounds like the corticosteroid injection helped quite a bit but now she has some retention of fluid in the popliteal space.  We discussed.  We will try conservative measures.  She needs to back off on the treadmill walking for the next 2 weeks.  We will do knee compression bracing, meloxicam orally, icing.  Return to clinic 2 to 3 weeks and if not improved would consider aspiration under ultrasound at that time.  If she has increasing problems in the interim, she will return to clinic sooner.

## 2020-10-31 NOTE — Progress Notes (Signed)
  Tonya Mckenzie - 60 y.o. female MRN [id number]  Date of birth: 08/09/1960    SUBJECTIVE:      Chief Complaint:/ HPI:    Right knee pain.  Feels stiff.She was seen here for similar pain in the right knee on August 11.  At that time it was medial anterior pain and she received a corticosteroid injection which helped that pain totally.  A few days after the injection she noticed that the posterior part of her knee was feeling stiff and that has not improved.  She is active walker, usually does 8 to 9 miles on a treadmill every day.  In the last week or so she has decreased that to 2 miles but still having pain toward the end of that workout.  She recalls no specific injury.  The knee feels swollen but does not look swollen.  No locking, no giving way.   OBJECTIVE: Ht '5\' 6"'$  (1.676 m)   Wt 168 lb (76.2 kg)   BMI 27.12 kg/m   Physical Exam:  Vital signs are reviewed. GENERAL: Well-developed female no acute distress KNEES: Symmetrical.  Right knee has some fullness in the popliteal space and is mildly tender there particularly on the posterior lateral area.  She is ligamentously intact to varus and valgus stress, normal Lachman. Neuro: Intact sensation to soft touch bilateral lower extremities  ULTRASOUND: Right knee: Quadricep and patellar tendons are intact.  Medial meniscus appears intact.  Lateral meniscus has some protrusion there are some osteophytes and a small amount of fluid noted.  Popliteal space reveals a large cystic collection of fluid that is not adjacent to the popliteal artery or vein.  ASSESSMENT & PLAN:  See problem based charting & AVS for pt instructions. No problem-specific Assessment & Plan notes found for this encounter.

## 2020-11-08 ENCOUNTER — Other Ambulatory Visit: Payer: Self-pay | Admitting: Family Medicine

## 2020-11-08 DIAGNOSIS — E785 Hyperlipidemia, unspecified: Secondary | ICD-10-CM

## 2020-11-11 ENCOUNTER — Telehealth: Payer: Self-pay

## 2020-11-11 NOTE — Telephone Encounter (Signed)
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 Patient is requesting 90 day supply.   Please advise.   Talbot Grumbling, RN

## 2020-11-14 ENCOUNTER — Ambulatory Visit (INDEPENDENT_AMBULATORY_CARE_PROVIDER_SITE_OTHER): Payer: Medicaid Other | Admitting: Family Medicine

## 2020-11-14 ENCOUNTER — Other Ambulatory Visit: Payer: Self-pay

## 2020-11-14 ENCOUNTER — Telehealth: Payer: Self-pay | Admitting: Family Medicine

## 2020-11-14 ENCOUNTER — Ambulatory Visit (HOSPITAL_COMMUNITY)
Admission: RE | Admit: 2020-11-14 | Discharge: 2020-11-14 | Disposition: A | Discharge status: Home / Self Care | Payer: Medicaid Other | Source: Ambulatory Visit | Attending: Family Medicine | Admitting: Family Medicine

## 2020-11-14 VITALS — Ht 66.0 in | Wt 167.0 lb

## 2020-11-14 DIAGNOSIS — M7989 Other specified soft tissue disorders: Secondary | ICD-10-CM

## 2020-11-14 DIAGNOSIS — M79661 Pain in right lower leg: Secondary | ICD-10-CM

## 2020-11-14 MED ORDER — NAPROXEN 500 MG PO TABS: 500.0000 mg | ORAL_TABLET | Freq: Two times a day (BID) | ORAL | 0 refills | Status: DC | PRN

## 2020-11-14 NOTE — Patient Instructions (Signed)
Thank you for coming to see me today. It was a pleasure. Today we talked about:   We will contact you with results of the doppler ultrasound.  Stop meloxicam, start naproxen instead which is a twice daily medication.  Please follow-up with [place] in 2 weeks for your pain.  If you have any questions or concerns, please do not hesitate to call the office at [phone].  Best,   Arizona Constable, DO [place]

## 2020-11-14 NOTE — Progress Notes (Signed)
   Tonya Mckenzie is a 60 y.o. female who presents to [facility] today for the following:  Right knee pain Last seen for the same on 10/31/2020 At that time ultrasound performed that showed Baker's cyst She was advised to decrease her treadmill walking, do knee compression bracing, meloxicam She received a cortisone injection on 8/11 which helped medial pain that was occurring at that time, but posterior pain was the reason for her visit on 9/2 Pain is about the same in posterior region Sometimes burning into calf Has had new swelling into her right ankle in the last few days Has decreased her treadmill use and has only been cycling at home States that the pain is usually worse in the morning States that Mobic minimally improves her pain  PMH reviewed.  ROS as above. Medications reviewed.  Exam:  Ht '5\' 6"'$  (1.676 m)   Wt 167 lb (75.8 kg)   BMI 26.95 kg/m  Gen: Well NAD MSK:  Right Knee:  - Inspection: She has some 1+ pitting edema of the right ankle and a swelling over the posterior aspect of her right knee.  Skin intact - Palpation: no TTP b/l - ROM: full active ROM with flexion and extension in knee and hip b/l - Strength: 5/5 strength b/l - Neuro/vasc: NV intact distally b/l - Special Tests: - LIGAMENTS: negative anterior and posterior drawer, negative Lachman's, no MCL or LCL laxity  -- MENISCUS: negative McMurray's, equivocal Thessaly  -- PF JOINT: nml patellar mobility bilaterally.  negative patellar grind, negative patellar apprehension  Hips: normal ROM   Limited MSK ultrasound right knee: Images were obtained both in the transverse and longitudinal plane. Patellar and quadriceps tendons were well visualized with no abnormalities. No effusion. She has a small hypoechoic medial cystic collection likely c/w small Baker's cyst. She has a hypoechoic cystic area that is lateral to position of Baker's cyst that does not appear to connect, is compressible, and appears to have  positive doppler flow    Assessment and Plan: 1) Pain and swelling of lower leg, right Patient denied chest pain and shortness of breath.  She was sent to the hospital for a DVT ultrasound to rule out a clot causing her unilateral leg swelling.  This was negative for clot, does suggest that the hypoechoic collection may be secondary to a muscle tear which could explain her symptoms.  We will have patient rest and treat her with naproxen, advised to discontinue Mobic and ibuprofen.  She will follow-up in 2 weeks, at that time hopefully we could start her on a physical therapy regimen.  She has a compression sleeve, advised her to wear this.   Arizona Constable, D.O.  PGY-4 [facility]  11/14/2020 1:07 PM  Addendum:  Patient seen in the office by fellow.  Her history, exam, plan of care were precepted with me.  Karlton Lemon MD [place]

## 2020-11-14 NOTE — Assessment & Plan Note (Signed)
Patient denied chest pain and shortness of breath.  She was sent to the hospital for a DVT ultrasound to rule out a clot causing her unilateral leg swelling.  This was negative for clot, does suggest that the hypoechoic collection may be secondary to a muscle tear which could explain her symptoms.  We will have patient rest and treat her with naproxen, advised to discontinue Mobic and ibuprofen.  She will follow-up in 2 weeks, at that time hopefully we could start her on a physical therapy regimen.  She has a compression sleeve, advised her to wear this.

## 2020-11-14 NOTE — Progress Notes (Signed)
Right lower extremity venous duplex completed. Refer to "CV Proc" under chart review to view preliminary results.  11/14/2020 11:23 AM Kelby Aline., MHA, RVT, RDCS, RDMS

## 2020-11-14 NOTE — Telephone Encounter (Signed)
Left VM that DVT US negative.  Will see back in 2 weeks.

## 2020-11-18 ENCOUNTER — Encounter: Payer: Self-pay | Admitting: Family Medicine

## 2020-11-18 ENCOUNTER — Other Ambulatory Visit (HOSPITAL_COMMUNITY)
Admission: RE | Admit: 2020-11-18 | Discharge: 2020-11-18 | Disposition: A | Discharge status: Home / Self Care | Payer: Medicaid Other | Source: Ambulatory Visit | Attending: Family Medicine | Admitting: Family Medicine

## 2020-11-18 ENCOUNTER — Ambulatory Visit (INDEPENDENT_AMBULATORY_CARE_PROVIDER_SITE_OTHER): Payer: Medicaid Other | Admitting: Family Medicine

## 2020-11-18 ENCOUNTER — Other Ambulatory Visit: Payer: Self-pay

## 2020-11-18 VITALS — BP 132/90 | HR 75 | Ht 66.0 in | Wt 171.8 lb

## 2020-11-18 DIAGNOSIS — K1370 Unspecified lesions of oral mucosa: Secondary | ICD-10-CM

## 2020-11-18 DIAGNOSIS — N898 Other specified noninflammatory disorders of vagina: Secondary | ICD-10-CM | POA: Diagnosis not present

## 2020-11-18 LAB — POCT WET PREP (WET MOUNT)
Clue Cells Wet Prep Whiff POC: NEGATIVE
Trichomonas Wet Prep HPF POC: ABSENT

## 2020-11-18 NOTE — Patient Instructions (Addendum)
It was nice seeing you today!  Wet prep was negative. I will call you or send you a letter regarding the rest of your results.  Stay well, Zola Button, MD [place] [phone]

## 2020-11-18 NOTE — Progress Notes (Signed)
    SUBJECTIVE:   CHIEF COMPLAINT / HPI:   Vaginal discharge Patient reports vaginal discharge for 1 week with fishy odor.  She states she has not been sexually active but is amenable to STI testing including HIV and RPR blood test.  Patient reports history of total hysterectomy and does not have a cervix.  Patient also expresses concern for herpes.  She reports that a previous doctor, she had a positive HSV test and then a negative HSV test.  She has noticed occasional bump on her left after shaving and had a cold sore with viral illness previously.  She denies any outbreaks with multiple lesions.  No history of genital lesions.  She states she had a small bump after shaving within the past few weeks which disappeared the following day.  No lesions currently.  PERTINENT  PMH / PSH: MDD, PTSD, substance use  OBJECTIVE:   BP 132/90   Pulse 75   Ht 5\' 6"  (1.676 m)   Wt 171 lb 12.8 oz (77.9 kg)   SpO2 100%   BMI 27.73 kg/m   General: Middle-aged female, NAD HEENT: MMM, no lesions appreciated CV: Regular rate Pulm: Nonlabored respirations GU: No external labial lesions visualized  Chaperone present  ASSESSMENT/PLAN:   Vaginal discharge Wet prep negative.  GC/chlamydia (vaginal fluid sample given she does not have a cervix), HIV, and RPR collected and pending.   Oral lesions No lesions appreciated today.  No history of outbreaks, likely folliculitis based on history related to shaving and quick resolution.  Low suspicion for HSV infection, provided reassurance.  Discussed that we can swab future lesions, especially if she develops multiple lesions.  Zola Button, MD [place]

## 2020-11-19 LAB — CERVICOVAGINAL ANCILLARY ONLY
Chlamydia: NEGATIVE
Comment: NEGATIVE
Comment: NORMAL
Neisseria Gonorrhea: NEGATIVE

## 2020-11-19 LAB — RPR: RPR Ser Ql: NONREACTIVE

## 2020-11-19 LAB — HIV ANTIBODY (ROUTINE TESTING W REFLEX): HIV Screen 4th Generation wRfx: NONREACTIVE

## 2020-11-20 ENCOUNTER — Encounter: Payer: Self-pay | Admitting: Family Medicine

## 2020-11-28 ENCOUNTER — Ambulatory Visit: Payer: Medicaid Other | Admitting: Family Medicine

## 2020-11-28 VITALS — Ht 66.0 in | Wt 167.0 lb

## 2020-11-28 DIAGNOSIS — M25561 Pain in right knee: Secondary | ICD-10-CM | POA: Diagnosis present

## 2020-11-28 NOTE — Assessment & Plan Note (Signed)
Overall 80% improved.  Can have her continue naproxen, advised that she can take as needed and does not need to take a scheduled.  We will have her continue with compression sleeve.  She can gradually increase her activity.  Given quad strengthening and VMO exercises to do at home.  Follow-up in 1 month.  She did specifically ask about a shot, however advised that given her significant improvement in pain and lack of significant evidence of osteoarthritis, would recommend holding off on this, she was in agreement.

## 2020-11-28 NOTE — Patient Instructions (Signed)
Thank you for coming to see me today. It was a pleasure. Today we talked about:   You can wean off the naproxen and use as needed.  You can gradually increase your activity.  Do the strengthening exercises we gave you to help support your knee.  Please follow-up with [place] in 1 month.  If you have any questions or concerns, please do not hesitate to call the office at [phone].  Best,   Arizona Constable, DO [place]

## 2020-11-28 NOTE — Progress Notes (Signed)
   Tonya Mckenzie is a 60 y.o. female who presents to [facility] today for the following:  Follow-up right knee pain Last seen for the same on 9/16 At that time had unilateral leg swelling and had a DVT ultrasound that was negative for clot Ultrasound did suggest a possible tear within the medial gastroc Overall she is doing well Reports that she is seen about an 80% improvement in her pain Has been taking naproxen twice daily Reports that pain is usually worse in the morning and she will have some stiffness, will feel a pop and then will get better Has been on the treadmill less and using the bike more which is helpful She is wearing a compression sleeve  PMH reviewed.   ROS as above. Medications reviewed.  Exam:  Ht 5\' 6"  (1.676 m)   Wt 167 lb (75.8 kg)   BMI 26.95 kg/m  Gen: Well NAD MSK:  Right Knee:  - Inspection: no gross deformity b/l. No swelling/effusion, erythema or bruising b/l. Skin intact - Palpation: no TTP b/l - ROM: full active ROM with flexion and extension in knee and hip b/l - Strength: 5/5 strength b/l - Neuro/vasc: NV intact distally b/l - Special Tests: - LIGAMENTS: negative anterior and posterior drawer, negative Lachman's, no MCL laxity, she does have a slightly loose LCL on the right, however has no pain with this and knee is otherwise stable -- MENISCUS: negative McMurray's -- PF JOINT: nml patellar mobility bilaterally.  negative patellar grind, negative patellar apprehension  Hips: normal ROM    Assessment and Plan: 1) Posterior right knee pain Overall 80% improved.  Can have her continue naproxen, advised that she can take as needed and does not need to take a scheduled.  We will have her continue with compression sleeve.  She can gradually increase her activity.  Given quad strengthening and VMO exercises to do at home.  Follow-up in 1 month.  She did specifically ask about a shot, however advised that given her significant improvement in pain and lack  of significant evidence of osteoarthritis, would recommend holding off on this, she was in agreement.   Arizona Constable, D.O.  PGY-4 [facility]  11/28/2020 9:17 AM  Addendum:  Patient seen in the office by fellow.  Her history, exam, plan of care were precepted with me.  Karlton Lemon MD Kirt Boys

## 2020-12-03 ENCOUNTER — Ambulatory Visit (INDEPENDENT_AMBULATORY_CARE_PROVIDER_SITE_OTHER): Payer: Medicaid Other

## 2020-12-03 ENCOUNTER — Other Ambulatory Visit: Payer: Self-pay

## 2020-12-03 DIAGNOSIS — Z23 Encounter for immunization: Secondary | ICD-10-CM | POA: Diagnosis present

## 2020-12-10 ENCOUNTER — Ambulatory Visit: Payer: Medicaid Other | Admitting: Family Medicine

## 2020-12-10 NOTE — Progress Notes (Deleted)
   Tonya Mckenzie is a 60 y.o. female who presents to [facility] today for the following:  Follow-up right knee pain Last seen for the same on 9/30 At that time was doing well Was given home exercise program ***   PMH reviewed. *** ROS as above. Medications reviewed.  Exam:  There were no vitals taken for this visit. Gen: Well NAD MSK:  *** Knee: - Inspection: no gross deformity b/l. No swelling/effusion, erythema or bruising b/l. Skin intact - Palpation: no TTP b/l - ROM: full active ROM with flexion and extension in knee and hip b/l - Strength: 5/5 strength b/l - Neuro/vasc: NV intact distally b/l - Special Tests: - LIGAMENTS: negative anterior and posterior drawer, negative Lachman's, no MCL or LCL laxity  -- MENISCUS: negative McMurray's, negative Thessaly  -- PF JOINT: nml patellar mobility bilaterally.  negative patellar grind, negative patellar apprehension  Hips: normal ROM, negative FABER and FADIR bilaterally    Assessment and Plan: 1) No problem-specific Assessment & Plan notes found for this encounter.   Arizona Constable, D.O.  PGY-4 [facility]  12/10/2020 8:10 AM

## 2020-12-17 ENCOUNTER — Ambulatory Visit: Payer: Medicaid Other | Admitting: Family Medicine

## 2020-12-17 VITALS — BP 110/80 | Ht 66.0 in | Wt 170.0 lb

## 2020-12-17 DIAGNOSIS — M25561 Pain in right knee: Secondary | ICD-10-CM

## 2020-12-17 DIAGNOSIS — M7989 Other specified soft tissue disorders: Secondary | ICD-10-CM | POA: Diagnosis not present

## 2020-12-17 DIAGNOSIS — M79661 Pain in right lower leg: Secondary | ICD-10-CM

## 2020-12-17 MED ORDER — NAPROXEN 500 MG PO TABS: 500.0000 mg | ORAL_TABLET | Freq: Two times a day (BID) | ORAL | 0 refills | Status: DC | PRN

## 2020-12-17 NOTE — Assessment & Plan Note (Signed)
In regards to her right knee pain, it is possible that she has some arthritis that is contributing, there are no films of her knees to be reviewed, therefore we will obtain x-rays.  We will have her continue to do home exercise program.  Did discuss with her that it is possible she may never be completely without pain and that it may come and go slightly.  Refill provided for naproxen, advised if she would like she can also trial Voltaren gel topically.  Follow-up in 1 month.

## 2020-12-17 NOTE — Progress Notes (Signed)
   Tonya Mckenzie is a 60 y.o. female who presents to [facility] today for the following:  Follow-up right knee pain Last seen for the same on 9/30 At that time was doing well Was given home exercise program Overall has not had any worsening, but has not had significant improvement Continues to have pain off and on, mostly in the posterior aspect of her knee Also occasionally reports some pain at the medial joint line She has been taking Tylenol as needed for the pain Reports occasional swelling   PMH reviewed.  ROS as above. Medications reviewed.  Exam:  BP 110/80   Ht 5\' 6"  (1.676 m)   Wt 170 lb (77.1 kg)   BMI 27.44 kg/m  Gen: Well NAD MSK:  Right Knee: - Inspection: no gross deformity b/l. No swelling/effusion, erythema or bruising b/l. Skin intact - Palpation: mild TTP medial joint line - ROM: full active ROM with flexion and extension in knee and hip b/l - Strength: 5/5 strength b/l - Neuro/vasc: NV intact distally b/l - Special Tests: - LIGAMENTS: negative anterior and posterior drawer, negative Lachman's, no MCL or LCL laxity  -- MENISCUS: negative McMurray's, negative Thessaly  -- PF JOINT: nml patellar mobility bilaterally.  negative patellar grind, negative patellar apprehension  Hips: normal ROM, negative FABER and FADIR bilaterally    Assessment and Plan: 1) Posterior right knee pain In regards to her right knee pain, it is possible that she has some arthritis that is contributing, there are no films of her knees to be reviewed, therefore we will obtain x-rays.  We will have her continue to do home exercise program.  Did discuss with her that it is possible she may never be completely without pain and that it may come and go slightly.  Refill provided for naproxen, advised if she would like she can also trial Voltaren gel topically.  Follow-up in 1 month.   Arizona Constable, D.O.  PGY-4 [facility]  12/17/2020 5:24 PM   I was the preceptor for  this visit and available for immediate consultation Shellia Cleverly, DO

## 2020-12-17 NOTE — Patient Instructions (Signed)
Thank you for coming to see me today. It was a pleasure. Today we talked about:   I have refilled your naproxen.  You can also use topical voltaren gel that you can buy over the counter to help with pain and to reduce inflammation.  Continue your exercises.  We will get x-rays of your knee to assess the joint space.  Please follow-up with [place] in 1 month.  If you have any questions or concerns, please do not hesitate to call the office at [phone].  Best,   Arizona Constable, DO [place]

## 2021-01-28 ENCOUNTER — Encounter: Payer: Self-pay | Admitting: Family Medicine

## 2021-01-28 ENCOUNTER — Ambulatory Visit (INDEPENDENT_AMBULATORY_CARE_PROVIDER_SITE_OTHER): Payer: Medicaid Other | Admitting: Family Medicine

## 2021-01-28 VITALS — BP 120/82 | Ht 66.0 in | Wt 165.0 lb

## 2021-01-28 DIAGNOSIS — M25561 Pain in right knee: Secondary | ICD-10-CM

## 2021-01-28 MED ORDER — PREDNISONE 10 MG PO TABS: ORAL_TABLET | ORAL | 0 refills | Status: DC

## 2021-01-28 NOTE — Progress Notes (Signed)
PCP: Donney Dice, DO  Subjective:   HPI: Patient is a 60 y.o. female here for right knee pain.  10/19: Follow-up right knee pain Last seen for the same on 9/30 At that time was doing well Was given home exercise program Overall has not had any worsening, but has not had significant improvement Continues to have pain off and on, mostly in the posterior aspect of her knee Also occasionally reports some pain at the medial joint line She has been taking Tylenol as needed for the pain Reports occasional swelling  11/30: Patient reports she continues to improve. Doing home exercises occasionally. Pain inconsistent where it is located - sometimes anterior, sometimes posterior. Has not limited her activities as a result. Was curious about repeat injection to help with remainder of pain (had one 3.5 months ago).  Past Medical History:  Diagnosis Date   Allergic rhinitis    Anxiety    Bacterial vaginosis    Chronic post-traumatic stress disorder (PTSD)    Cocaine abuse ([place])    Depression    ETOH abuse    GERD (gastroesophageal reflux disease)    Hyperlipidemia    Hypertension    off meds due to weight loss   Marijuana abuse    Uterine leiomyoma     Current Outpatient Medications on File Prior to Visit  Medication Sig Dispense Refill   amLODipine (NORVASC) 5 MG tablet TAKE 1 TABLET(5 MG) BY MOUTH AT BEDTIME. START TAKING WHEN YOU DECREASE YOUR CLONIDINE TO EVERY DAY 90 tablet 3   ezetimibe (ZETIA) 10 MG tablet Take 1 tablet (10 mg total) by mouth daily. 90 tablet 3   gabapentin (NEURONTIN) 100 MG capsule Take 1 capsule (100 mg total) by mouth 2 (two) times daily for 14 days. For pain. 28 capsule 0   Multiple Vitamins-Minerals (WOMENS MULTIVITAMIN PO) Take by mouth.     naproxen (NAPROSYN) 500 MG tablet Take 1 tablet (500 mg total) by mouth 2 (two) times daily as needed. 60 tablet 0   pantoprazole (PROTONIX) 40 MG tablet TAKE 1 TABLET(40 MG) BY MOUTH DAILY 30 tablet 0    rosuvastatin (CRESTOR) 40 MG tablet TAKE 1 TABLET(40 MG) BY MOUTH DAILY 90 tablet 1   Vitamin D, Ergocalciferol, (DRISDOL) 1.25 MG (50000 UT) CAPS capsule TK 1 C PO ONCE A WEEK     No current facility-administered medications on file prior to visit.    Past Surgical History:  Procedure Laterality Date   ABDOMINAL HYSTERECTOMY     left ovaries, took cervix.  Performed for heavy periods and anemia   BICEPT TENODESIS Left 03/06/2020   Procedure: BICEPS TENODESIS;  Surgeon: Hiram Gash, MD;  Location: [facility];  Service: Orthopedics;  Laterality: Left;   CHOLECYSTECTOMY     SHOULDER ARTHROSCOPY WITH ROTATOR CUFF REPAIR AND SUBACROMIAL DECOMPRESSION Left 03/06/2020   Procedure: SHOULDER ARTHROSCOPY WITH ROTATOR CUFF REPAIR AND SUBACROMIAL DECOMPRESSION PARTIAL ACROMIOPLASTY AND DISTAL CLAVICULECTOMY;  Surgeon: Hiram Gash, MD;  Location: [facility];  Service: Orthopedics;  Laterality: Left;   thumb surgery     right   TOE SURGERY Left    TONSILLECTOMY     TYMPANOSTOMY TUBE PLACEMENT      No Known Allergies  BP 120/82   Ht 5\' 6"  (1.676 m)   Wt 165 lb (74.8 kg)   BMI 26.63 kg/m   [facility] 10/31/2020 11/14/2020 11/28/2020 11/28/2020 12/17/2020 01/28/2021  Frequency of aerobic exercise (# of days/week) 4 4  4 4 4 4   Average time in minutes 40 40 40 5 5 5   Frequency of strengthening activities (# of days/week) 4 4 4 2 2 2     No flowsheet data found.      Objective:  Physical Exam:  Gen: NAD, comfortable in exam room  Right knee: No gross deformity, ecchymoses, swelling. No TTP. FROM with normal strength. Negative ant/post drawers. Negative valgus/varus testing. Negative lachman. Negative mcmurrays, apleys. NV intact distally.   Assessment & Plan:  1. Right knee pain - likely with some arthritic changes accounting for residual pain.  Hasn't gotten x-rays yet so encouraged her to do so.  Recommended against repeat  injection at this time as she's improving.  She states she had steroid dose pack for shoulder pain in past and this helped - she would like to try this.  Hold the naproxen in meantime.  Continue home exercises.  F/u prn.

## 2021-01-28 NOTE — Patient Instructions (Signed)
Start prednisone dose pack x 6 days. I would stop the naproxen while you are on this. I would recommend getting the x-rays of your knee that have been ordered. Do home exercises most days of the week for quad strengthening. If this is still bothering you to the point you would consider a repeat injection after the prednisone pills, give [place] a call and we will go ahead with that.

## 2021-02-24 ENCOUNTER — Ambulatory Visit
Admission: RE | Admit: 2021-02-24 | Discharge: 2021-02-24 | Disposition: A | Discharge status: Home / Self Care | Payer: Medicaid Other | Source: Ambulatory Visit | Attending: Sports Medicine | Admitting: Sports Medicine

## 2021-02-24 DIAGNOSIS — M25561 Pain in right knee: Secondary | ICD-10-CM

## 2021-03-04 ENCOUNTER — Ambulatory Visit (INDEPENDENT_AMBULATORY_CARE_PROVIDER_SITE_OTHER): Payer: Medicaid Other | Admitting: Family Medicine

## 2021-03-04 VITALS — BP 118/82 | Ht 66.0 in | Wt 170.0 lb

## 2021-03-04 DIAGNOSIS — M25561 Pain in right knee: Secondary | ICD-10-CM

## 2021-03-04 DIAGNOSIS — G8929 Other chronic pain: Secondary | ICD-10-CM | POA: Diagnosis not present

## 2021-03-04 MED ORDER — METHYLPREDNISOLONE ACETATE 40 MG/ML IJ SUSP
40.0000 mg | Freq: Once | INTRAMUSCULAR | Status: AC
  Administered 2021-03-04: 40 mg via INTRA_ARTICULAR

## 2021-03-04 NOTE — Assessment & Plan Note (Addendum)
X-ray results reviewed with patient.  Also discussed that over time corticosteroid injections can deteriorate joint at a faster pace and would only recommend doing these if she has been in acute flare.  Could consider that she has degenerative meniscus tear, however there is no significant injury or symptoms to suggest that she should require an MRI at this time.  Recommended continuing home exercise program and given sports insoles size 7.5-8.5 with small scaphoid pads which she found comfortable and will hopefully help with her alignment.  Discussed risk and benefits of a corticosteroid injection and she opts to proceed with this today.  We will have her follow-up in 2 months as needed.    Procedure performed: Right knee intraarticular corticosteroid injection; palpation guided  Consent obtained and verified. Time-out conducted. Noted no overlying erythema, induration, or other signs of local infection. The  right medial joint space was palpated and marked. The overlying skin was prepped in a sterile fashion. Topical analgesic spray: Ethyl chloride. Joint: Right knee Needle: 25 gauge, 1.5 inch Completed without difficulty. Meds: 40 mg methylrprednisolone, 4 ml 1% lidocaine without epinephrine  Advised to call if fevers/chills, erythema, induration, drainage, or persistent bleeding.

## 2021-03-04 NOTE — Patient Instructions (Signed)
Thank you for coming to see me today. It was a pleasure. Today we talked about:   Today you received an injection with corticosteroid. This injection is usually done in response to pain and inflammation. There is some "numbing" medicine also in the shot so the injected area may be numb and feel really good for the next couple of hours. The numbing medicine usually wears off in 2-3 hours though, and then your pain level will be right back where it was before the injection.   The actually benefit from the steroid injection is usually noticed in 2-7 days. You may actually experience a small (as in 10%) INCREASE in pain in the first 24 hours---that is common.   Things to watch out for that you should contact us or a health care provider urgently would include: 1. Unusual (as in more than 10%) increase in pain 2. New fever > 101.5 3. New swelling or redness of the injected area.  4. Streaking of red lines around the area injected.   Please follow-up with [place] in 2 months as needed.  If you have any questions or concerns, please do not hesitate to call the office at [phone].  Best,   Arizona Constable, DO [place]

## 2021-03-04 NOTE — Progress Notes (Signed)
° °  Tonya Mckenzie is a 61 y.o. female who presents to [facility] today for the following:  Right knee pain Seen for the same in 11/30 Was given steroid Dosepak Had an x-ray of her right knee on 12/27 with mild to moderate medial compartment osteoarthritis Reports that Dosepak initially helped, however has significantly worsened over the last few weeks States that she has been doing her exercises and feels like overall she is having some slight improvement, however feels like she has any current flare of her pain Does state that she feels that the different shoes that she wears affects her pain   PMH reviewed.  ROS as above. Medications reviewed.  Exam:  BP 118/82    Ht 5\' 6"  (1.676 m)    Wt 170 lb (77.1 kg)    BMI 27.44 kg/m  Gen: Well NAD MSK:  Limited right knee: - Inspection: Mild swelling of right anterior knee.  No  erythema or bruising b/l. Skin intact.  She does have some excessive pronation bilaterally of her feet. - Palpation: Tenderness to palpation at medial joint line - ROM: full active ROM with flexion and extension in knee and hip b/l - Strength: 5/5 strength b/l - Neuro/vasc: NV intact distally b/l   Hips: normal ROM   Right knee x-ray from 12/27 personally reviewed, showing mild to moderate medial compartment osteoarthritis   Assessment and Plan: 1) Chronic pain of right knee X-ray results reviewed with patient.  Also discussed that over time corticosteroid injections can deteriorate joint at a faster pace and would only recommend doing these if she has been in acute flare.  Could consider that she has degenerative meniscus tear, however there is no significant injury or symptoms to suggest that she should require an MRI at this time.  Recommended continuing home exercise program and given sports insoles size 7.5-8.5 with small scaphoid pads which she found comfortable and will hopefully help with her alignment.  Discussed risk and benefits of a corticosteroid injection  and she opts to proceed with this today.  We will have her follow-up in 2 months as needed.    Procedure performed: Right knee intraarticular corticosteroid injection; palpation guided  Consent obtained and verified. Time-out conducted. Noted no overlying erythema, induration, or other signs of local infection. The  right medial joint space was palpated and marked. The overlying skin was prepped in a sterile fashion. Topical analgesic spray: Ethyl chloride. Joint: Right knee Needle: 25 gauge, 1.5 inch Completed without difficulty. Meds: 40 mg methylrprednisolone, 4 ml 1% lidocaine without epinephrine  Advised to call if fevers/chills, erythema, induration, drainage, or persistent bleeding.    Arizona Constable, D.O.  PGY-4 [facility]  03/04/2021 2:55 PM  Addendum:  I was the preceptor for this visit and available for immediate consultation.  Karlton Lemon MD [place] Boys

## 2021-03-11 ENCOUNTER — Telehealth: Payer: Self-pay | Admitting: Family Medicine

## 2021-03-11 ENCOUNTER — Other Ambulatory Visit: Payer: Self-pay | Admitting: Family Medicine

## 2021-03-11 DIAGNOSIS — I10 Essential (primary) hypertension: Secondary | ICD-10-CM

## 2021-03-11 MED ORDER — AMLODIPINE BESYLATE 5 MG PO TABS: ORAL_TABLET | ORAL | 3 refills | Status: DC

## 2021-03-11 NOTE — Telephone Encounter (Signed)
Patient came in stated that she needs to get a refill on Amolodine, she doesn't have anymore refills on her bottle. She has about a week left on her meds.

## 2021-03-17 ENCOUNTER — Other Ambulatory Visit: Payer: Self-pay | Admitting: Family Medicine

## 2021-03-17 DIAGNOSIS — Z1231 Encounter for screening mammogram for malignant neoplasm of breast: Secondary | ICD-10-CM

## 2021-04-06 ENCOUNTER — Encounter: Payer: Self-pay | Admitting: Family Medicine

## 2021-04-06 ENCOUNTER — Ambulatory Visit (INDEPENDENT_AMBULATORY_CARE_PROVIDER_SITE_OTHER): Payer: Medicaid Other | Admitting: Family Medicine

## 2021-04-06 ENCOUNTER — Other Ambulatory Visit: Payer: Self-pay

## 2021-04-06 DIAGNOSIS — E782 Mixed hyperlipidemia: Secondary | ICD-10-CM | POA: Diagnosis not present

## 2021-04-06 DIAGNOSIS — I10 Essential (primary) hypertension: Secondary | ICD-10-CM | POA: Diagnosis present

## 2021-04-06 NOTE — Progress Notes (Signed)
° ° °  SUBJECTIVE:   CHIEF COMPLAINT / HPI:   Patient presents for blood pressure check with history of hypertension. Endorses compliance on amlodipine 5 mg. Denies chest pain, dyspnea and leg swelling. Denies any concerns relating to her blood pressure.   PERTINENT  PMH / PSH:  Hyperlipidemia Compliant on crestor and zetia, tolerating medications well without complications. Most recent lipid panel in June 2022 notable for cholesterol of 207 and LDL of 108.   OBJECTIVE:   BP 123/82    Pulse 73    Ht 5\' 6"  (1.676 m)    Wt 171 lb 12.8 oz (77.9 kg)    SpO2 100%    BMI 27.73 kg/m   General: Patient well-appearing, in no acute distress. CV: RRR, no murmurs or gallops auscultated Resp: CTAB, no wheezing, rales or rhonchi noted Abdomen: soft, nontender, presence of bowel sounds Ext: radial pulses strong and equal bilaterally, no LE edema noted bilaterally Psych: mood appropriate, pleasant   ASSESSMENT/PLAN:   Essential hypertension -BP 123/82, at goal -continue amlodipine 5 mg, no changes today -follow up in 5 months for physical   Hyperlipidemia -continue crestor and zetia -diet and exercise counseling provided -repeat lipid panel in 5 months    PHQ-9 score of 0 reviewed.   Donney Dice, [place]

## 2021-04-06 NOTE — Assessment & Plan Note (Addendum)
-  continue crestor and zetia -diet and exercise counseling provided -repeat lipid panel in 5 months

## 2021-04-06 NOTE — Assessment & Plan Note (Signed)
-  BP 123/82, at goal -continue amlodipine 5 mg, no changes today -follow up in 5 months for physical

## 2021-04-06 NOTE — Patient Instructions (Signed)
It was great seeing you today!  Today we discussed many things including your blood pressure, it looks great today. Please continue to take amlodipine once daily. Eat a balanced diet and exercise regularly.   Try to eat high fiber foods so that you continue to have regular bowel movements.   Please follow up at your next scheduled appointment in 5 months for a physical, if anything arises between now and then, please don't hesitate to contact our office.   Thank you for allowing [place] to be a part of your medical care!  Thank you, Dr. Larae Grooms

## 2021-04-15 ENCOUNTER — Ambulatory Visit (INDEPENDENT_AMBULATORY_CARE_PROVIDER_SITE_OTHER): Payer: Medicaid Other | Admitting: Family Medicine

## 2021-04-15 VITALS — BP 122/94 | Ht 66.0 in | Wt 169.0 lb

## 2021-04-15 DIAGNOSIS — M5416 Radiculopathy, lumbar region: Secondary | ICD-10-CM | POA: Diagnosis not present

## 2021-04-15 MED ORDER — METHYLPREDNISOLONE ACETATE 80 MG/ML IJ SUSP
80.0000 mg | Freq: Once | INTRAMUSCULAR | Status: AC
Start: 2021-04-15 — End: 2021-04-15
  Administered 2021-04-15: 80 mg via INTRAMUSCULAR

## 2021-04-15 MED ORDER — KETOROLAC TROMETHAMINE 60 MG/2ML IM SOLN
60.0000 mg | Freq: Once | INTRAMUSCULAR | Status: AC
  Administered 2021-04-15: 60 mg via INTRAMUSCULAR

## 2021-04-15 NOTE — Assessment & Plan Note (Signed)
Concern for lumbar radiculopathy as cause of pain.  Could consider muscle cramps and if doesn't improve with treatment for lumbar spine, would consider bloodwork.  No red flags on history or examination.  We will treat with IM Toradol and methylprednisolone today in the office.  We will also obtain lumbar spine films and have her perform McKenzie extension exercises.  We will have her follow-up in 4 weeks.  Advised to go to the ED immediately for red flag symptoms, see AVS.  Follow-up sooner than 4 weeks if pain is not improving.

## 2021-04-15 NOTE — Patient Instructions (Signed)
Thank you for coming to see me today. It was a pleasure. Today we talked about:  Your pain is likely coming from your back.  We gave you some injections today to help with your pain.    I have placed an order for x-rays of your back.  Please go to [facility] to have this completed.  You do not need an appointment.  We will contact you with your results afterwards.   We have also given you some exercises to do which you should try to do about 4 days a week.  If you have pain from the exercises, do not do anything that causes this pain.  If you experience numbness and tingling in your groin, difficulty urinating, weakness on one side of your body or the other, you should go to the emergency room right away.  If your pain is incredibly worse and unable to be managed at home, you should also go to the emergency room.   Please follow-up with [place] in 4 weeks  If you have any questions or concerns, please do not hesitate to call the office at [phone].  Best,   Arizona Constable, DO [place]

## 2021-04-15 NOTE — Progress Notes (Signed)
° °  Tonya Mckenzie is a 61 y.o. female who presents to [facility] today for the following:  Bilateral Posterior Leg Pain Right knee has been doing well since injection States that for about the last month she has been having worsening pain in her posterior legs States that goes from her bilateral buttocks down to her bilateral feet She denies any numbness and tingling Denies any back pain Denies saddle anesthesias, changes in bowel or bladder habits, leg weakness States that stretching does not seem to help States that sometimes it goes away during the day while she is in the pool, but it comes right back She is having difficulty sleeping because of the pain She has been taking Tylenol without improvement She denies any nighttime cramping States that the pain feels like a deep ache  Last Lumbar spine films from 2019 showing some slight anteriolisthesis at L3   PMH reviewed.  ROS as above. Medications reviewed.  Exam:  BP (!) 122/94    Ht 5\' 6"  (1.676 m)    Wt 169 lb (76.7 kg)    BMI 27.28 kg/m  Gen: Well NAD MSK:  Lumbar spine: - Inspection: no gross deformity or asymmetry, swelling or ecchymosis - Palpation: No TTP over the spinous processes, paraspinal muscles, or SI joints b/l - ROM: full active ROM of the lumbar spine in flexion and extension without pain - Strength: 5/5 strength of lower extremity in L4-S1 nerve root distributions b/l; normal gait - Neuro: sensation intact in the L4-S1 nerve root distribution b/l, 2+ L4 and S1 reflexes - Special testing: Negative straight leg raise, Negative FABER, FADIR No pain with strength testing or palpation of hamstrings b/l   No results found.   Assessment and Plan: 1) Lumbar radiculopathy Concern for lumbar radiculopathy as cause of pain.  Could consider muscle cramps and if doesn't improve with treatment for lumbar spine, would consider bloodwork.  No red flags on history or examination.  We will treat with IM Toradol and  methylprednisolone today in the office.  We will also obtain lumbar spine films and have her perform McKenzie extension exercises.  We will have her follow-up in 4 weeks.  Advised to go to the ED immediately for red flag symptoms, see AVS.  Follow-up sooner than 4 weeks if pain is not improving.   Arizona Constable, D.O.  PGY-4 [facility]  04/15/2021 2:49 PM  Addendum:  I was the preceptor for this visit and available for immediate consultation.  Karlton Lemon MD [place]irt Boys

## 2021-04-21 ENCOUNTER — Ambulatory Visit
Admission: RE | Admit: 2021-04-21 | Discharge: 2021-04-21 | Disposition: A | Discharge status: Home / Self Care | Payer: Medicaid Other | Source: Ambulatory Visit | Attending: Family Medicine | Admitting: Family Medicine

## 2021-04-21 ENCOUNTER — Other Ambulatory Visit: Payer: Self-pay

## 2021-04-21 DIAGNOSIS — M5416 Radiculopathy, lumbar region: Secondary | ICD-10-CM

## 2021-04-27 ENCOUNTER — Ambulatory Visit: Payer: Medicaid Other

## 2021-05-11 IMAGING — DX DG SHOULDER 2+V*L*
3 series · 3 of 3 positions shown · non-contrast
Comparison: None.

CLINICAL DATA: Left shoulder pain, no known injury, initial
encounter

EXAM:
LEFT SHOULDER - 2+ VIEW

[dg shoulder left (1 of 3)]
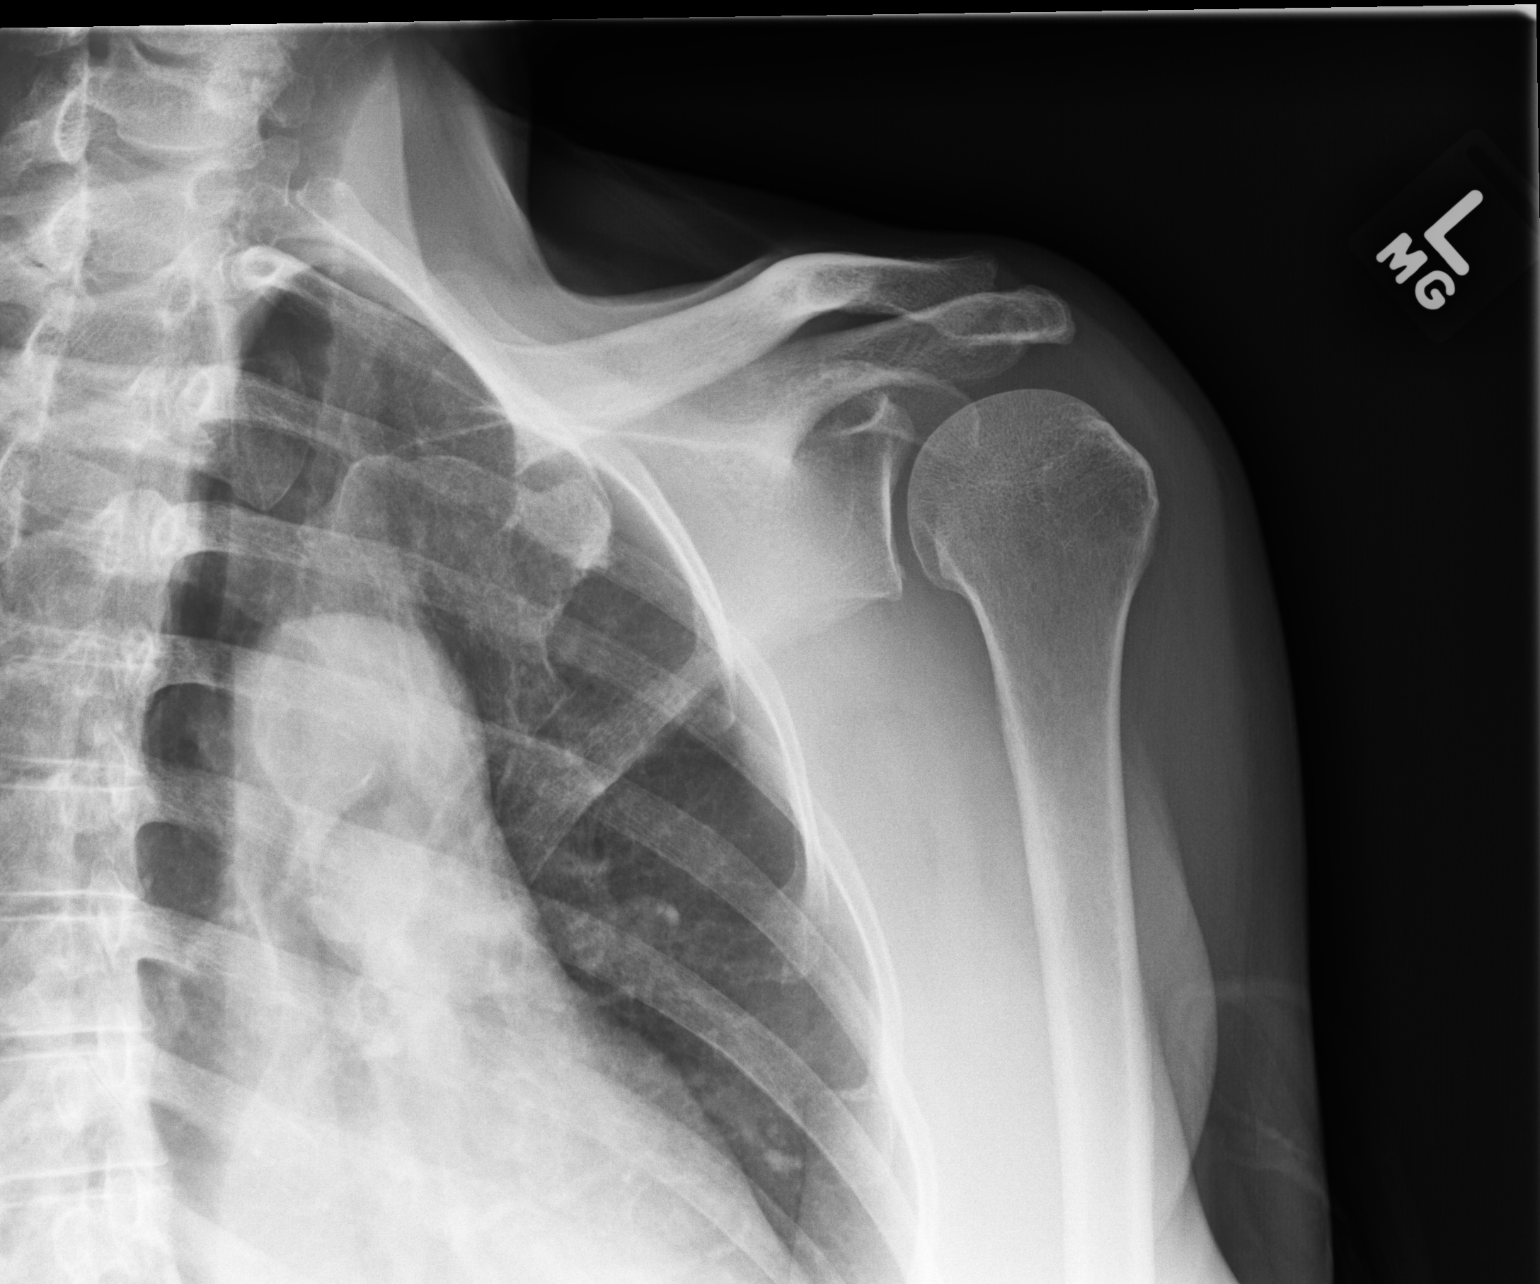

[dg shoulder left (2 of 3)]
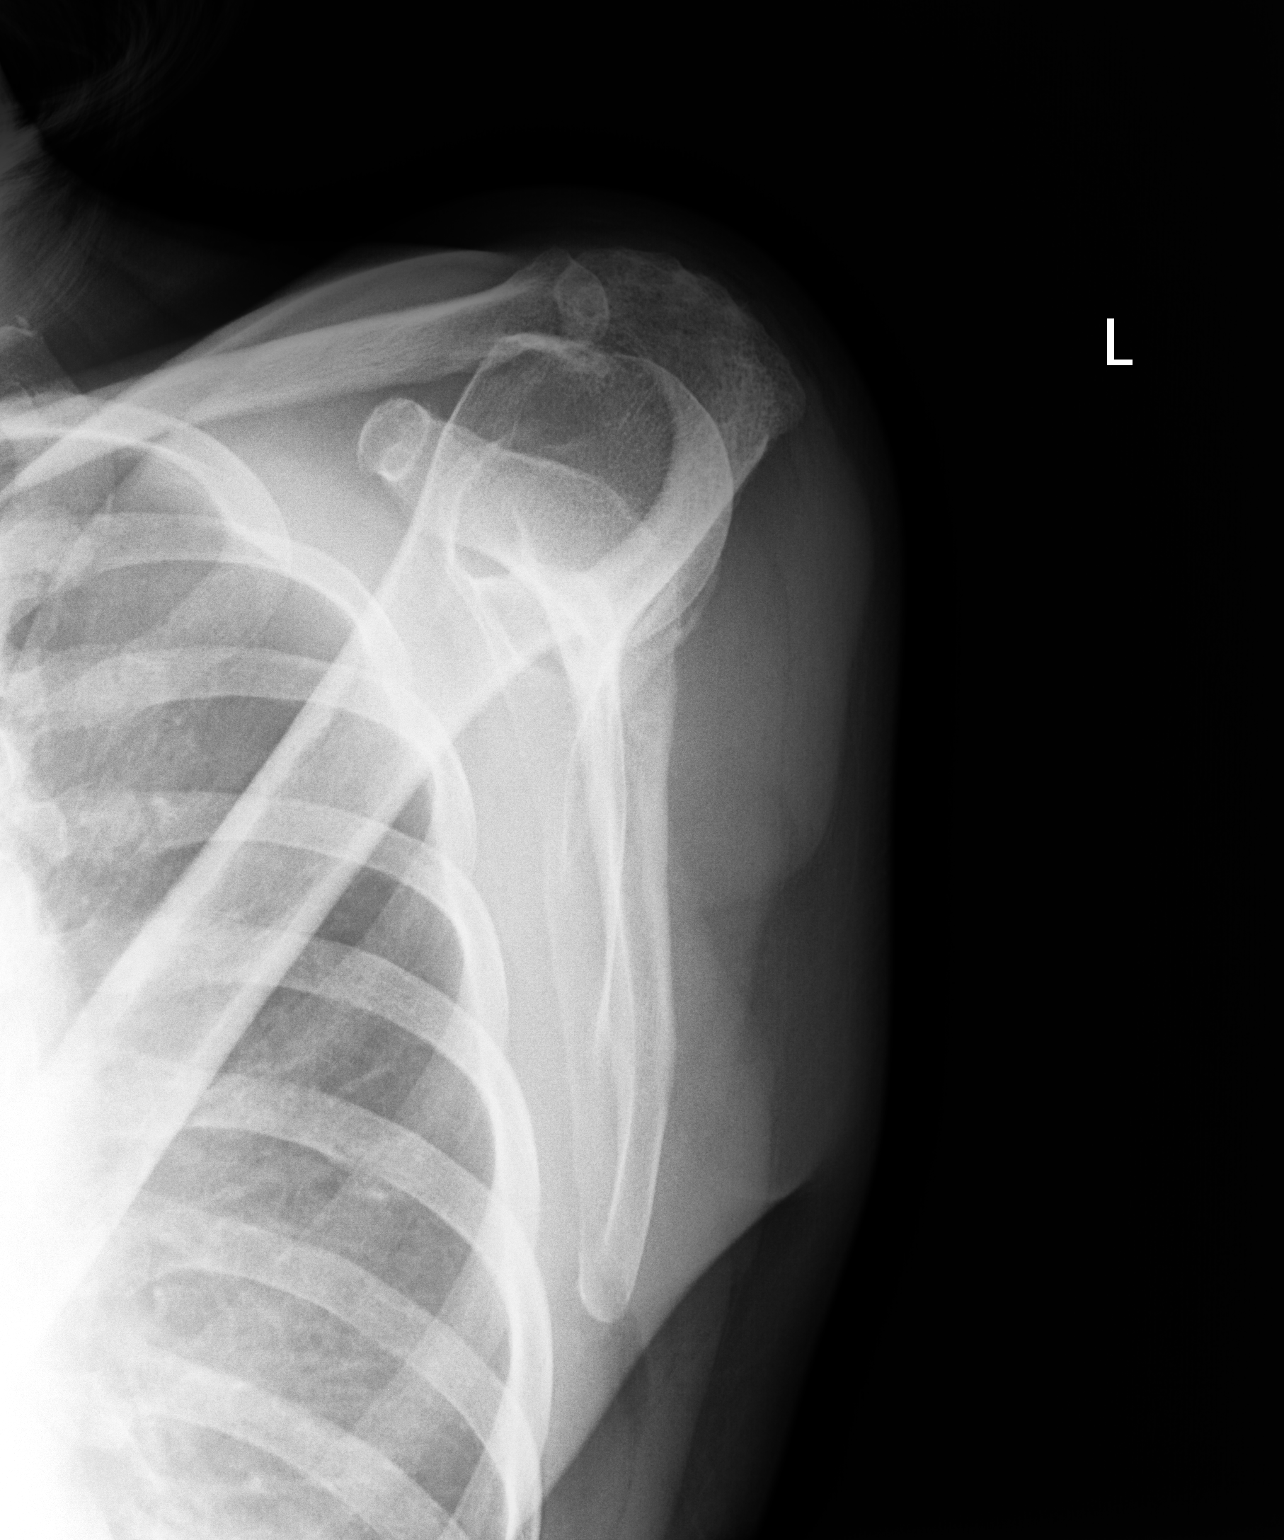

[dg shoulder left (3 of 3)]
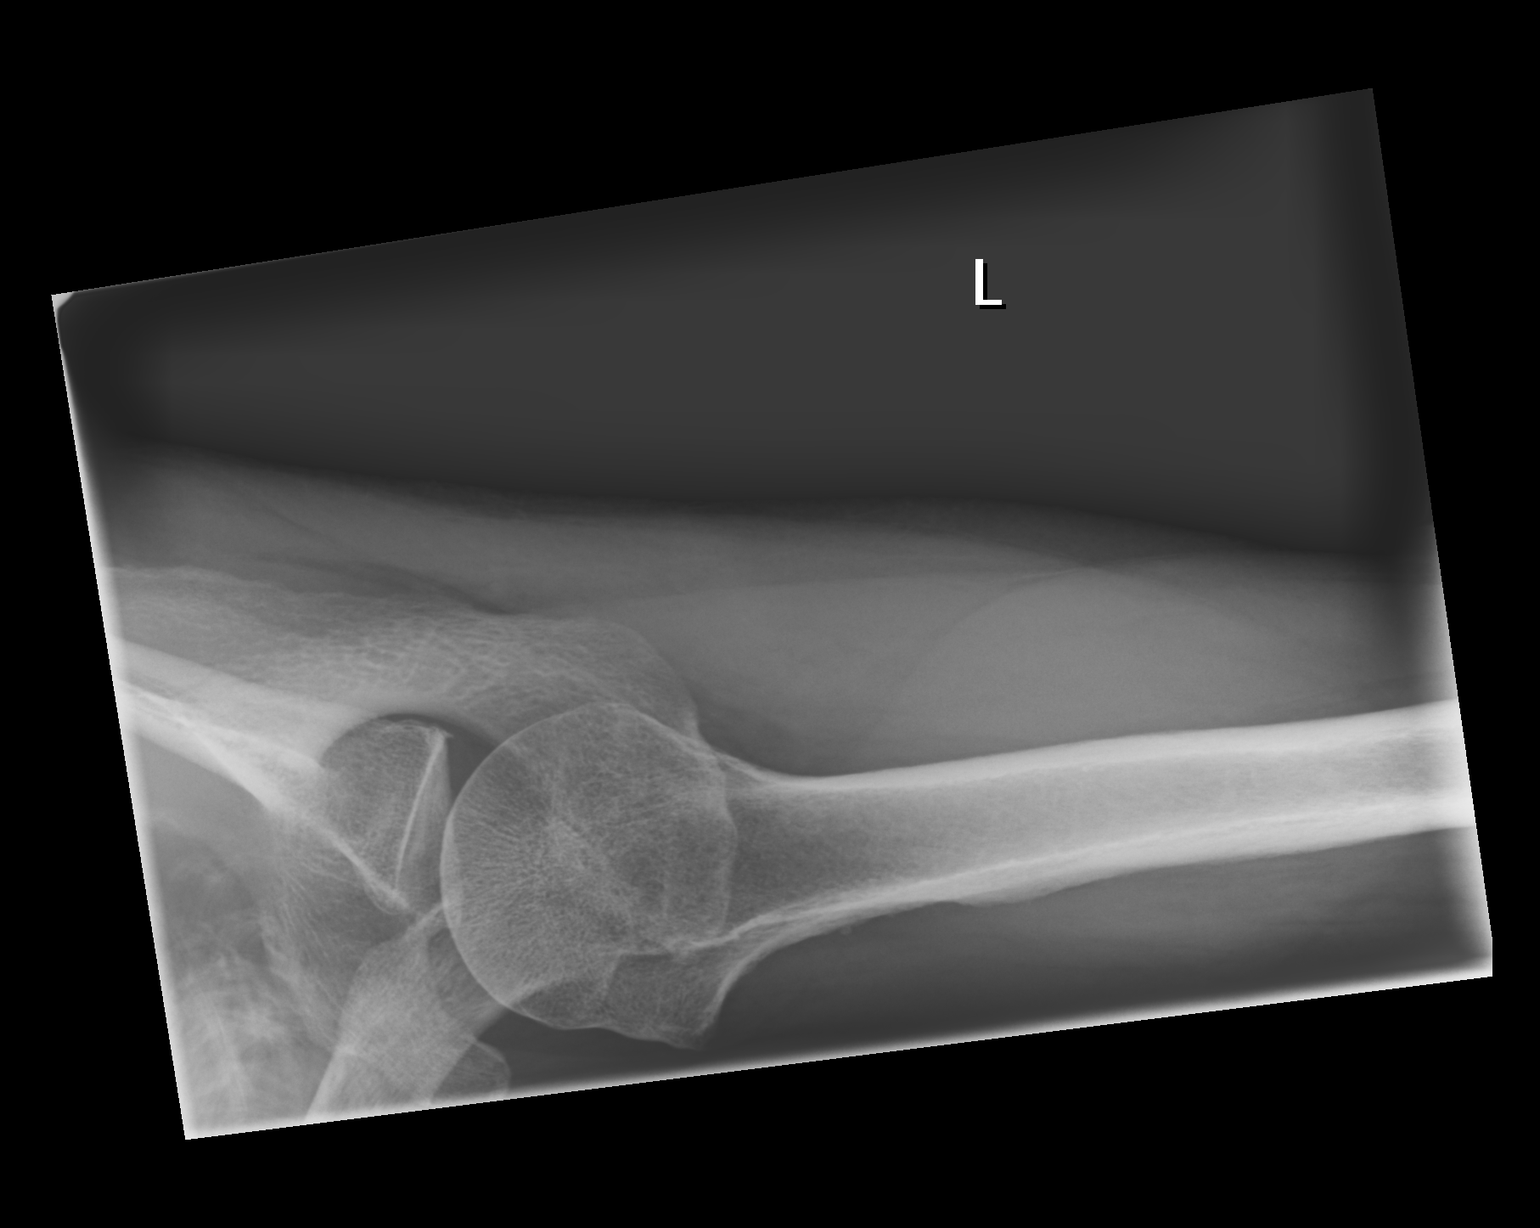

[3 of 3 positions shown; findings below may reference images not displayed]

FINDINGS: No acute fracture or dislocation is noted. Mild glenohumeral
degenerative changes are seen. The underlying bony thorax is within
normal limits.
IMPRESSION: Mild degenerative change without acute abnormality.

## 2021-05-13 ENCOUNTER — Ambulatory Visit
Admission: RE | Admit: 2021-05-13 | Discharge: 2021-05-13 | Disposition: A | Discharge status: Home / Self Care | Payer: Medicaid Other | Source: Ambulatory Visit | Attending: Family Medicine | Admitting: Family Medicine

## 2021-05-13 DIAGNOSIS — Z1231 Encounter for screening mammogram for malignant neoplasm of breast: Secondary | ICD-10-CM

## 2021-05-18 ENCOUNTER — Encounter: Payer: Self-pay | Admitting: Family Medicine

## 2021-05-25 ENCOUNTER — Other Ambulatory Visit: Payer: Self-pay | Admitting: Family Medicine

## 2021-05-25 DIAGNOSIS — E785 Hyperlipidemia, unspecified: Secondary | ICD-10-CM

## 2021-06-24 ENCOUNTER — Ambulatory Visit (INDEPENDENT_AMBULATORY_CARE_PROVIDER_SITE_OTHER): Payer: Medicaid Other | Admitting: Family Medicine

## 2021-06-24 VITALS — BP 116/71 | Ht 66.0 in | Wt 166.0 lb

## 2021-06-24 DIAGNOSIS — M5416 Radiculopathy, lumbar region: Secondary | ICD-10-CM

## 2021-06-24 MED ORDER — KETOROLAC TROMETHAMINE 60 MG/2ML IM SOLN
60.0000 mg | Freq: Once | INTRAMUSCULAR | Status: AC
Start: 2021-06-24 — End: 2021-06-24
  Administered 2021-06-24: 60 mg via INTRAMUSCULAR

## 2021-06-24 MED ORDER — METHYLPREDNISOLONE ACETATE 80 MG/ML IJ SUSP
80.0000 mg | Freq: Once | INTRAMUSCULAR | Status: AC
Start: 2021-06-24 — End: 2021-06-24
  Administered 2021-06-24: 80 mg via INTRAMUSCULAR

## 2021-06-24 NOTE — Assessment & Plan Note (Signed)
Had improvement previously in her pain with treatment for lumbar radiculopathy.  X-rays do show some findings that could be suggestive of this.  No concerning findings on examination, could continue conservative management or progress to an MRI if this seems to be worsening.  She does not seem to be having true muscle soreness or spasms to suggest the need for blood work.  We will treat with IM Toradol 60 mg and Depo-Medrol 80 mg today.  Recommend restarting her home exercise program.  We will have her follow-up in 6 to 8 weeks to see how she is doing at that time. ?

## 2021-06-24 NOTE — Progress Notes (Signed)
? ?  Tonya Mckenzie is a 61 y.o. female who presents to [facility] today for the following: ? ?Bilateral posterior leg pain ?Last seen for same on 2/15 ?At that time received IM Toradol and Depo-Medrol injections ?Also given McKenzie extension exercises ?Lumbar spine films show mild degenerative disc disease, worse at L3-L4 and L4-L5, grade I anterolisthesis of L4 on L5 ?Had significant improvement in her symptoms into the last few weeks ?His started to have bilateral posterior leg pain that comes and goes ?States that today is about a 6 out of 10 and more severe than it has been the last few days ?States that she has not had any new injuries ?Denies any new changes ?Denies any numbness and tingling ?States that the pain comes along the back of her hamstrings and goes down into her calfs ?Usually feels better with movement ?She started doing the exercises again a week or so ago when the pain started up again ? ?PMH reviewed.  ?ROS as above. ?Medications reviewed. ? ?Exam:  ?BP 116/71   Ht '5\' 6"'$  (1.676 m)   Wt 166 lb (75.3 kg)   BMI 26.79 kg/m?  ?Gen: Well NAD ?MSK: ? ?Lumbar spine: ?- Inspection: no gross deformity or asymmetry, swelling or ecchymosis ?- Palpation: No TTP over the spinous processes, paraspinal muscles, or SI joints b/l ?- ROM: full active ROM of the lumbar spine in flexion and extension without pain ?- Strength: 5/5 strength of lower extremity in L4-S1 nerve root distributions b/l; normal gait ?- Neuro: sensation intact in the L4-S1 nerve root distribution b/l, 2+ L4 and S1 reflexes ?- Special testing: Negative straight leg raise, Negative FABER, FADIR ? ?No tenderness palpation of the bilateral hamstrings or calfs.  She has 5/5 strength in both her gastrocs as well as her hamstring musculature without any pain ? ? ?No results found. ? ? ?Assessment and Plan: ?1) Lumbar radiculopathy ?Had improvement previously in her pain with treatment for lumbar radiculopathy.  X-rays do show some findings that could be  suggestive of this.  No concerning findings on examination, could continue conservative management or progress to an MRI if this seems to be worsening.  She does not seem to be having true muscle soreness or spasms to suggest the need for blood work.  We will treat with IM Toradol 60 mg and Depo-Medrol 80 mg today.  Recommend restarting her home exercise program.  We will have her follow-up in 6 to 8 weeks to see how she is doing at that time. ? ? ?Arizona Constable, D.O.  ?PGY-4 [facility]  ?06/24/2021 3:15 PM ? ?Addendum:  I was the preceptor for this visit and available for immediate consultation.  Karlton Lemon MD CAQSM ? ?

## 2021-06-24 NOTE — Patient Instructions (Signed)
Thank you for coming to see me today. It was a pleasure. Today we talked about:  ? ?We gave you some injections for your pain today.  It seems like this is still coming from your back as the rest of your exam is normal. ? ?Start doing your exercises again 5 times a week. ? ?Please follow-up with [place] in 6-8 weeks. ? ?If you have any questions or concerns, please do not hesitate to call the office at [phone]. ? ?Best,  ? ?Arizona Constable, DO ?[place]  ?

## 2021-06-25 ENCOUNTER — Telehealth: Payer: Self-pay

## 2021-06-25 NOTE — Telephone Encounter (Signed)
Patient calls nurse line requesting a jury duty excuse from PCP.  ? ?Patient reports she is due to serve on 5/10. ? ?Please advise.  ? ? ?

## 2021-07-30 ENCOUNTER — Ambulatory Visit: Payer: Medicaid Other | Admitting: Nurse Practitioner

## 2021-08-04 ENCOUNTER — Encounter: Payer: Self-pay | Admitting: *Deleted

## 2021-08-05 NOTE — Progress Notes (Deleted)
   Tonya Mckenzie is a 61 y.o. female who presents to [facility] today for the following:  Bilateral posterior leg pain Last seen for this on 4/26 Has received IM Toradol and Depo-Medrol injections for this in the past and done well Also given McKenzie extension exercises ***   PMH reviewed. *** ROS as above. Medications reviewed.  Exam:  There were no vitals taken for this visit. Gen: Well NAD MSK:  No results found.   Assessment and Plan: 1) No problem-specific Assessment & Plan notes found for this encounter.   Arizona Constable, D.O.  PGY-4 [facility]  08/05/2021 11:32 AM

## 2021-08-06 ENCOUNTER — Ambulatory Visit (INDEPENDENT_AMBULATORY_CARE_PROVIDER_SITE_OTHER): Payer: Medicaid Other | Admitting: Family Medicine

## 2021-08-06 DIAGNOSIS — M5416 Radiculopathy, lumbar region: Secondary | ICD-10-CM | POA: Diagnosis present

## 2021-08-06 MED ORDER — METHYLPREDNISOLONE ACETATE 80 MG/ML IJ SUSP
80.0000 mg | Freq: Once | INTRAMUSCULAR | Status: AC
  Administered 2021-08-06: 80 mg via INTRAMUSCULAR

## 2021-08-06 MED ORDER — KETOROLAC TROMETHAMINE 60 MG/2ML IM SOLN
60.0000 mg | Freq: Once | INTRAMUSCULAR | Status: AC
  Administered 2021-08-06: 60 mg via INTRAMUSCULAR

## 2021-08-06 NOTE — Assessment & Plan Note (Addendum)
Now more isolated to bilateral posterior knees and lower legs.  Examination unremarkable.  Received significant improvement with the Toradol and Depo-Medrol injection in April.  Discussed concerns with repeat injections and the possible side effects.  Shared decision making to provide 1 more injection today.  Encouraged her to continue home exercises.  Follow up 6-8 weeks or as needed.

## 2021-08-06 NOTE — Progress Notes (Signed)
   Tonya Mckenzie is a 61 y.o. female who presents to [facility] today for the following:  Bilateral leg pain that she rates as an intermittent 5-6 out of 10 pain.  Describes it as a soreness behind both knees.  She was last here in April and received Toradol and Depo-Medrol injection, felt like that significantly helped with her thigh pain.  She feels that if she gets 1 more injection her pain will go away.  She has been doing home exercises which seem to help.  She is also started back at [employer].  The pain seems to be worse in the mornings but her exercises do help. No injuries.   PMH reviewed.  ROS as above. Medications reviewed.  Exam:  BP 112/78   Ht '5\' 6"'$  (1.676 m)   Wt 164 lb (74.4 kg)   BMI 26.47 kg/m  Gen: Well NAD MSK:  Back Exam:  Inspection: Unremarkable  Palpable tenderness: None. Range of Motion:  Flexion 45 deg; Extension 45 deg; Side Bending to 45 deg bilaterally; Rotation to 45 deg bilaterally  Leg strength: Quad: 5/5 Hamstring: 5/5 Hip flexor: 5/5 Hip abductors: 5/5  Gait unremarkable. SLR laying: Negative  FABER: negative. Bilateral Hip/Legs:  - Inspection: No gross deformity, no swelling, erythema, or ecchymosis b/l - Palpation: No TTP, specifically none over patella or popliteal space. No joint line tenderness  - ROM: Normal range of motion on Flexion, extension - Strength: Normal strength in all fields b/l - Neuro/vasc: NV intact distally b/l - Special Tests: Negative log roll  Assessment and Plan: 1) Lumbar radiculopathy Now more isolated to bilateral posterior knees and lower legs.  Examination unremarkable.  Received significant improvement with the Toradol and Depo-Medrol injection in April.  Discussed concerns with repeat injections and the possible side effects.  Shared decision making to provide 1 more injection today.  Encouraged her to continue home exercises.  Follow up 6-8 weeks or as needed. Also discussed incorporating beet root powder or tart  cherry juice for muscle soreness.  Sharion Settler, DO   Sports Medicine Fellow Addendum   I have independently interviewed and examined the patient. I have discussed the above with the original author and agree with their documentation. My edits for correction/addition/clarification have been made.    Arizona Constable, D.O. PGY-4, [facility] 05/06/2021 3:07 PM   I was the preceptor for this visit and available for immediate consultation Shellia Cleverly, DO

## 2021-08-06 NOTE — Patient Instructions (Signed)
Thank you for coming to see me today. It was a pleasure. Today we talked about:   It has been so great knowing you and getting to help take care of you!  Continue to stay active!  We will give you a one time dose of steroid and anti-inflammatory to help with your pain, but we shouldn't repeat this anymore as it can affect the rest of your body.   You can drink 6oz of tart cherry juice, or use beet root powder within an hour of working out to help with muscle soreness.  Please follow-up with [place] as needed.  If you have any questions or concerns, please do not hesitate to call the office at [phone].  Best,   Arizona Constable, DO [place]

## 2021-08-20 ENCOUNTER — Ambulatory Visit: Payer: Medicaid Other | Admitting: Nurse Practitioner

## 2021-09-17 ENCOUNTER — Encounter: Payer: Self-pay | Admitting: Nurse Practitioner

## 2021-09-17 ENCOUNTER — Ambulatory Visit (INDEPENDENT_AMBULATORY_CARE_PROVIDER_SITE_OTHER): Payer: Medicaid Other | Admitting: Nurse Practitioner

## 2021-09-17 VITALS — BP 120/80 | HR 62 | Ht 66.0 in | Wt 164.5 lb

## 2021-09-17 DIAGNOSIS — K219 Gastro-esophageal reflux disease without esophagitis: Secondary | ICD-10-CM

## 2021-09-17 MED ORDER — OMEPRAZOLE 20 MG PO CPDR: 20.0000 mg | DELAYED_RELEASE_CAPSULE | Freq: Every day | ORAL | 3 refills | Status: DC

## 2021-09-17 NOTE — Progress Notes (Signed)
Chief Complaint:  acid regurgitation, burning in stomach   Assessment &  Plan   # 61 yo female with evening reflux symptoms associated with certain foods or if she eats late.  Discussed anti-reflux measures such as avoidance of late meals / bedtime snacks, HOB elevation (or use of wedge pillow), weight reduction ( if applicable)  / maintaining a healthy BMI ( body mass index),  and avoidance of trigger foods and caffeine.  She is not taking an acid blocker. Will try Omeprazole 20 mg 30 minutes before evening meal since her symptoms only occur in the evening.  Follow up with me in ~ 6 weeks.  Ultimate goal will be to manage reflux with lifestyle changes   # Chronic intermittent LUQ burning. Present for years. EGD with biopsies unremarkable. Pain previously felt to be musculoskeletal though she tells me the pain now occurs only if she consumes soda or a beer. Etiology unclear. No alarm features.  - Avoid known trigger foods / drinks.    HPI   Patient is a 61 year old female known to Dr. Loletha Carrow.  She has a past medical history significant for hyperlipidemia, hypertension, marijuana use, cocaine use, PTSD, anxiety, depression,  diabetes, cholecystectomy, hysterectomy  Indi has been seen here several times over the last several years for chronic burning in upper abdomen.  She had a normal upper endoscopy with biopsies in February 2018.  Previously her pain has been felt to be musculoskeletal. She was last seen by Dr. Loletha Carrow 08/22/2018, please refer to that office note for details.    Interval History:  Shanikia continues to have burning in her LUQ but only  if she drinks a beer or a soda. Baking soda and warm water relieves the burning. Pantoprazole is on home med list but she isn't taking it.   Additionally, Kyiesha complains of regurgitation of "acid fluid" into her throat in the evenings and this has been a fairly recent development. This occurs is she eats late and / or if she eats spicy  foods. Interestingly,  she regurgitates in the sitting or standing position, not when supine.  Even though she isn't taking an acid blocker she doesn't have any heartburn associated with the regurgitation.    Previous GI Evaluation   Feb 2018 EGD - Normal esophagus. - Erythematous mucosa in the antrum. Biopsied. - Normal examined duodenum. Surgical [P], gastric antrum - ESSENTIALLY UNREMARKABLE GASTRIC TYPE MUCOSA. - THERE IS NO EVIDENCE OF HELICOBACTER PYLORI, DYSPLASIA, OR MALIGNANCY.  March 2016 screening colonoscopy -Normal   Past Medical History:  Diagnosis Date   Allergic rhinitis    Anxiety    Bacterial vaginosis    Chronic post-traumatic stress disorder (PTSD)    Cocaine abuse ([place])    Depression    ETOH abuse    GERD (gastroesophageal reflux disease)    Hyperlipidemia    Hypertension    off meds due to weight loss   Marijuana abuse    Uterine leiomyoma     Past Surgical History:  Procedure Laterality Date   ABDOMINAL HYSTERECTOMY     left ovaries, took cervix.  Performed for heavy periods and anemia   BICEPT TENODESIS Left 03/06/2020   Procedure: BICEPS TENODESIS;  Surgeon: Hiram Gash, MD;  Location: [facility];  Service: Orthopedics;  Laterality: Left;   CHOLECYSTECTOMY     SHOULDER ARTHROSCOPY WITH ROTATOR CUFF REPAIR AND SUBACROMIAL DECOMPRESSION Left 03/06/2020   Procedure: SHOULDER ARTHROSCOPY WITH ROTATOR CUFF REPAIR AND SUBACROMIAL DECOMPRESSION PARTIAL  ACROMIOPLASTY AND DISTAL CLAVICULECTOMY;  Surgeon: Hiram Gash, MD;  Location: [facility];  Service: Orthopedics;  Laterality: Left;   thumb surgery     right   TOE SURGERY Left    TONSILLECTOMY     TYMPANOSTOMY TUBE PLACEMENT      Current Medications, Allergies, Family History and Social History were reviewed in [employer] record.    **I removed protonix from list, she isn't taking it.   Current Outpatient Medications  Medication Sig Dispense  Refill   amLODipine (NORVASC) 5 MG tablet TAKE 1 TABLET(5 MG) BY MOUTH AT BEDTIME. START TAKING WHEN YOU DECREASE YOUR CLONIDINE TO EVERY DAY 90 tablet 3   ezetimibe (ZETIA) 10 MG tablet Take 1 tablet (10 mg total) by mouth daily. 90 tablet 3   gabapentin (NEURONTIN) 100 MG capsule Take 1 capsule (100 mg total) by mouth 2 (two) times daily for 14 days. For pain. 28 capsule 0   Multiple Vitamins-Minerals (WOMENS MULTIVITAMIN PO) Take by mouth.     naproxen (NAPROSYN) 500 MG tablet Take 1 tablet (500 mg total) by mouth 2 (two) times daily as needed. 60 tablet 0   predniSONE (DELTASONE) 10 MG tablet 6 tabs po day 1, 5 tabs po day 2, 4 tabs po day 3, 3 tabs po day 4, 2 tabs po day 5, 1 tab po day 6 21 tablet 0   rosuvastatin (CRESTOR) 40 MG tablet TAKE 1 TABLET(40 MG) BY MOUTH DAILY 90 tablet 1   Vitamin D, Ergocalciferol, (DRISDOL) 1.25 MG (50000 UT) CAPS capsule TK 1 C PO ONCE A WEEK     No current facility-administered medications for this visit.    Review of Systems: No chest pain. No shortness of breath. No urinary complaints.    Physical Exam  Wt Readings from Last 3 Encounters:  08/06/21 164 lb (74.4 kg)  06/24/21 166 lb (75.3 kg)  04/15/21 169 lb (76.7 kg)    BP 120/80   Pulse 62   Ht '5\' 6"'$  (1.676 m)   Wt 164 lb 8 oz (74.6 kg)   BMI 26.55 kg/m  Constitutional:  Generally well appearing female in no acute distress. Psychiatric: Pleasant. Normal mood and affect. Behavior is normal. EENT: Pupils normal.  Conjunctivae are normal. No scleral icterus. Neck supple.  Cardiovascular: Normal rate, regular rhythm. No edema Pulmonary/chest: Effort normal and breath sounds normal. No wheezing, rales or rhonchi. Abdominal: Soft, nondistended, nontender. Bowel sounds active throughout. There are no masses palpable. No hepatomegaly. Neurological: Alert and oriented to person place and time. Skin: Skin is warm and dry. No rashes noted.  Tye Savoy, NP  09/17/2021, 12:47  PM

## 2021-09-17 NOTE — Patient Instructions (Addendum)
If you are age 59 or older, your body mass index should be between 23-30. Your Body mass index is 26.55 kg/m. If this is out of the aforementioned range listed, please consider follow up with your Primary Care Provider.  If you are age 34 or younger, your body mass index should be between 19-25. Your Body mass index is 26.55 kg/m. If this is out of the aformentioned range listed, please consider follow up with your Primary Care Provider.   [facility] providers would like to encourage you to use [facility] to communicate with providers for non-urgent requests or questions.  Due to long hold times on the telephone, sending your provider a message by [facility] may be a faster and more efficient way to get a response.  Please allow 48 business hours for a response.  Please remember that this is for non-urgent requests.   Reflux:   Acid Reflux --Trial of Omeprazole 20 mg 30 minutes before dinner.  --Avoid late meals / bedtime snacks.   --Avoid trigger foods ( foods which you know tend to aggravate you reflux symptoms). Some common trigger foods include spicy foods, fatty foods, acidic foods, chocolate and caffeine.  --If having nighttime symptoms elevate the head of bed 6-8 inches on blocks or bricks. If not able to elevate the head of the bed consider purchasing a wedge pillow to sleep on.    --Weight reduction / maintain a healthy BMI ( body mass index) may be help with reflux symptoms

## 2021-09-28 NOTE — Progress Notes (Signed)
____________________________________________________________  Attending physician addendum:  Thank you for sending this case to me. I have reviewed the entire note and agree with the plan.  Agreed that diet and lifestyle changes with a trial of low-dose acid suppression warranted for her reported reflux symptoms.  The burning upper abdominal discomfort still sounds benign  Wilfrid Lund, MD  ____________________________________________________________

## 2021-10-25 ENCOUNTER — Other Ambulatory Visit: Payer: Self-pay | Admitting: Family Medicine

## 2021-10-25 DIAGNOSIS — E785 Hyperlipidemia, unspecified: Secondary | ICD-10-CM

## 2021-10-27 ENCOUNTER — Ambulatory Visit: Payer: Medicaid Other | Admitting: Nurse Practitioner

## 2021-11-12 ENCOUNTER — Other Ambulatory Visit: Payer: Self-pay | Admitting: Family Medicine

## 2021-11-12 DIAGNOSIS — E785 Hyperlipidemia, unspecified: Secondary | ICD-10-CM

## 2021-11-15 ENCOUNTER — Other Ambulatory Visit: Payer: Self-pay | Admitting: Family Medicine

## 2021-11-15 DIAGNOSIS — E785 Hyperlipidemia, unspecified: Secondary | ICD-10-CM

## 2021-11-18 ENCOUNTER — Ambulatory Visit (INDEPENDENT_AMBULATORY_CARE_PROVIDER_SITE_OTHER): Payer: Medicaid Other | Admitting: Sports Medicine

## 2021-11-18 VITALS — BP 122/82 | Ht 66.0 in | Wt 168.0 lb

## 2021-11-18 DIAGNOSIS — M5416 Radiculopathy, lumbar region: Secondary | ICD-10-CM

## 2021-11-18 MED ORDER — IBUPROFEN 800 MG PO TABS: 800.0000 mg | ORAL_TABLET | Freq: Three times a day (TID) | ORAL | 0 refills | Status: DC | PRN

## 2021-11-18 NOTE — Progress Notes (Signed)
  Tonya Mckenzie - 61 y.o. female MRN [id number]  Date of birth: 18-May-1960    CHIEF COMPLAINT:   bilat hamstring pain    SUBJECTIVE:   HPI:  Pleasant 61 year old female comes to clinic to be evaluated for bilateral hamstring pain.  She has a history of chronic lumbar radiculopathy.  She has x-rays from earlier this year that showed multilevel degenerative disc disease, worst at L3-L4 and L4-L5.  She was last seen here in June of this year for lumbar radiculopathy.  At that time she was given an IM Toradol and Depo-Medrol shot which improved her pain.  That was her 3rd IM injections for this.    However now she is back only after 2-1/2 months with worsening pain.  She now reports her pain is like a muscular ache in her hamstrings that stops at the posterior knees.  She also has associated aching down the proximal lateral aspect of the legs bilaterally.  She has been doing home exercises.  She has been going up to [employer] and doing stretches.  She has been taking Tylenol but is afraid of taking too much Tylenol and does not want to keep taking pills.  Denies any trauma to the back.  Denies any numbness or tingling distally past the knees.    ROS:     See HPI  PERTINENT  PMH / PSH FH / / SH:  Past Medical, Surgical, Social, and Family History Reviewed & Updated in the EMR.  Pertinent findings include:  none  OBJECTIVE: BP 122/82   Ht '5\' 6"'$  (1.676 m)   Wt 168 lb (76.2 kg)   BMI 27.12 kg/m   Physical Exam:  Vital signs are reviewed.  GEN: Alert and oriented, NAD Pulm: Breathing unlabored PSY: normal mood, congruent affect  MSK: Lumbar Spine -no obvious deformity.  Nontender to palpation lumbar spinous processes, no bony step-off.  Full range of motion flexion extension.  SLR negative bilaterally.   Lower extremities - no deformity.  No erythema.  Nontender greater trochanters or distally along IT bands bilaterally.  Nontender at ischial tuberosity nor hamstring muscle bellies  bilaterally. Leg strength quad 4/5 bilaterally, hamstring 4/5 on the right noticeably weaker than left which is 5/5.  Neurovascularly intact distally.  ASSESSMENT & PLAN:  1. Leg Pain secondary to Lumbar Radiculopathy  -Patient's history/exam certainly has a component of lumbar radiculopathy but she also has noticeable weakness the right hamstring.  I wonder if there is a neuropathic irritation at play.  Given the chronicity of her symptoms, failure to get better with conservative treatments including home exercises, oral anti-inflammatories, and intramuscular injections I think she needs an MRI of her lumbar spine to evaluate for nerve impingement.  In the meantime, we will give her prescription for formal physical therapy to work on hamstring strengthening.  I also gave her prescription ibuprofen 800 to take as needed for pain.  After getting the MRI, she will follow-up here in a few days to discuss the findings and next steps - will consider ESI/facet injections, neurosurgery referral.  All questions answered she agrees to plan.  Dortha Kern, MD PGY-4, Sports Medicine Fellow [place]  Addendum:  Patient seen in the office by fellow.  His history, exam, plan of care were precepted with me.  Karlton Lemon MD [place]

## 2021-11-19 ENCOUNTER — Encounter: Payer: Self-pay | Admitting: Sports Medicine

## 2021-11-25 NOTE — Progress Notes (Unsigned)
11/26/2021 Tonya Mckenzie [id number] 10-Aug-1960  Referring provider: Donney Dice, DO Primary GI doctor: Dr. Loletha Carrow  ASSESSMENT AND PLAN:   Assessment: 61 y.o. female here for assessment of the following: 1. Gastroesophageal reflux disease, unspecified whether esophagitis present   2. Lumbar radiculopathy   04/2016 EGD for chronic epigastric burning with biopsies unremarkable Status post cholecystectomy  Plan: Lifestyle changes discussed, avoid NSAIDS, ETOH Can do trial of PPI once daily nexium, with pepcid at night for 6 weeks, then just pepcid Possible muscular component on exam, she does work out. Try salon pas patches.  Talk with PCP about avoiding NSAIDS   Meds ordered this encounter  Medications   famotidine (PEPCID) 40 MG tablet    Sig: Take 1 tablet (40 mg total) by mouth at bedtime.    Dispense:  90 tablet    Refill:  3   esomeprazole (NEXIUM) 40 MG capsule    Sig: Take 1 capsule (40 mg total) by mouth daily before breakfast.    Dispense:  30 capsule    Refill:  2    History of Present Illness:  61 y.o. female  with a past medical history of hyperlipidemia, hypertension, marijuana use, cocaine use, PTSD with anxiety and depression, diabetes.  And others listed below, returns to clinic today for evaluation of GERD.   Status post cholecystectomy and hysterectomy. 04/2016 EGD for chronic epigastric burning with biopsies unremarkable Started on omeprazole 20 mg 30 minutes before evening meal.   Discussed antireflux measures.  Patient presents for follow-up. She states the omeprazole did not help, started pepcid OTC takes once in the morning that helps.  She feels like little pins in her stomach left upper.  Last night triggered after drinking a soda, worse with lying down, will feel it come up into her throat.  She denies dysphagia, weight loss. No melena, no hematochezia. No diarrhea, has occ constipation, will use stool softener.  Just started on ibuprofen  '800mg'$  once daily about a week ago.   She  reports that she has quit smoking. Her smoking use included cigarettes. She has never used smokeless tobacco. She reports that she does not currently use alcohol. She reports that she does not currently use drugs after having used the following drugs: Cocaine and Marijuana. Her family history includes Diabetes in her mother; Heart attack in her brother and father; Hypercholesterolemia in her mother; Hypertension in her mother.   Current Medications:   Current Outpatient Medications (Endocrine & Metabolic):    predniSONE (DELTASONE) 10 MG tablet, 6 tabs po day 1, 5 tabs po day 2, 4 tabs po day 3, 3 tabs po day 4, 2 tabs po day 5, 1 tab po day 6 (Patient not taking: Reported on 11/26/2021)  Current Outpatient Medications (Cardiovascular):    amLODipine (NORVASC) 5 MG tablet, TAKE 1 TABLET(5 MG) BY MOUTH AT BEDTIME. START TAKING WHEN YOU DECREASE YOUR CLONIDINE TO EVERY DAY   ezetimibe (ZETIA) 10 MG tablet, TAKE 1 TABLET(10 MG) BY MOUTH DAILY   rosuvastatin (CRESTOR) 40 MG tablet, TAKE 1 TABLET BY MOUTH EVERY DAY, YOU ARE OVERDUE FOR AN APPOINTMENT   Current Outpatient Medications (Analgesics):    ibuprofen (ADVIL) 800 MG tablet, Take 1 tablet (800 mg total) by mouth every 8 (eight) hours as needed.   Current Outpatient Medications (Other):    esomeprazole (NEXIUM) 40 MG capsule, Take 1 capsule (40 mg total) by mouth daily before breakfast.   famotidine (PEPCID) 40 MG tablet, Take 1 tablet (40  mg total) by mouth at bedtime.   gabapentin (NEURONTIN) 100 MG capsule, Take 1 capsule (100 mg total) by mouth 2 (two) times daily for 14 days. For pain.   Multiple Vitamins-Minerals (WOMENS MULTIVITAMIN PO), Take by mouth. (Patient not taking: Reported on 11/26/2021)   Vitamin D, Ergocalciferol, (DRISDOL) 1.25 MG (50000 UT) CAPS capsule,   Surgical History:  She  has a past surgical history that includes Cholecystectomy; Tympanostomy tube placement;  Tonsillectomy; Abdominal hysterectomy; Toe Surgery (Left); thumb surgery; Shoulder arthroscopy with rotator cuff repair and subacromial decompression (Left, 03/06/2020); and Bicept tenodesis (Left, 03/06/2020).  Current Medications, Allergies, Past Medical History, Past Surgical History, Family History and Social History were reviewed in [employer] [employer] record.  Physical Exam: BP 120/78   Pulse 70   Ht '5\' 6"'$  (1.676 m)   Wt 168 lb 6 oz (76.4 kg)   BMI 27.18 kg/m  General:   Pleasant, well developed female in no acute distress Heart : Regular rate and rhythm; no murmurs Pulm: Clear anteriorly; no wheezing Abdomen:  Soft, Non-distended AB, Active bowel sounds. mild tenderness pin point right upper AB tenderness with + carnnett Without guarding and Without rebound, No organomegaly appreciated. Rectal: Not evaluated Extremities:  without  edema. Neurologic:  Alert and  oriented x4;  No focal deficits.  Psych:  Cooperative. Normal mood and affect.   Vladimir Crofts, PA-C 11/26/21

## 2021-11-26 ENCOUNTER — Encounter: Payer: Self-pay | Admitting: Physician Assistant

## 2021-11-26 ENCOUNTER — Ambulatory Visit (INDEPENDENT_AMBULATORY_CARE_PROVIDER_SITE_OTHER): Payer: Medicaid Other | Admitting: Physician Assistant

## 2021-11-26 VITALS — BP 120/78 | HR 70 | Ht 66.0 in | Wt 168.4 lb

## 2021-11-26 DIAGNOSIS — K219 Gastro-esophageal reflux disease without esophagitis: Secondary | ICD-10-CM | POA: Diagnosis not present

## 2021-11-26 DIAGNOSIS — M5416 Radiculopathy, lumbar region: Secondary | ICD-10-CM | POA: Diagnosis not present

## 2021-11-26 MED ORDER — FAMOTIDINE 40 MG PO TABS: 40.0000 mg | ORAL_TABLET | Freq: Every day | ORAL | 3 refills | Status: DC

## 2021-11-26 MED ORDER — ESOMEPRAZOLE MAGNESIUM 40 MG PO CPDR: 40.0000 mg | DELAYED_RELEASE_CAPSULE | Freq: Every day | ORAL | 2 refills | Status: DC

## 2021-11-26 NOTE — Patient Instructions (Signed)
Try nexium once a day in the morning  and pepcid at night for 6-8 weeks then stop the nexium continue the pepcid and see how you do. '  No aleve, ibuprofen, goody powders, as these are antiinflammatories and can cause inflammation in your stomach, increase bleeding risk and cause ulcers.  You can talk with PCP about alternative pain options.  Can do tyelnol max 3000 mg a day, salon pas patches TRY THIS ON YOUR AB TO SEE IF IT HELPS, ON FOR 12 HOURS AND OFF FOR 12 HOURS are over the counter and voltern gel is topical antiinflammatory that is safe.    Please take the nexium 30 minutes to 1 hour before meals- this makes it more effective.  Avoid spicy and acidic foods Avoid fatty foods Limit your intake of coffee, tea, alcohol, and carbonated drinks Work to maintain a healthy weight Keep the head of the bed elevated at least 3 inches with blocks or a wedge pillow if you are having any nighttime symptoms Stay upright for 2 hours after eating Avoid meals and snacks three to four hours before bedtime

## 2021-11-26 NOTE — Progress Notes (Signed)
____________________________________________________________  Attending physician addendum:  Thank you for sending this case to me. I have reviewed the entire note and agree with the plan.  I still do not think her longstanding upper abdominal pain is digestive in nature, and her reported reflux symptoms sound benign.  A trial of acid suppression and focus on diet lifestyle measures is warranted but might not be able to eliminate them due to their underlying nature.  Wilfrid Lund, MD  ____________________________________________________________

## 2021-12-02 ENCOUNTER — Ambulatory Visit: Payer: Medicaid Other

## 2021-12-06 ENCOUNTER — Encounter (HOSPITAL_COMMUNITY): Payer: Self-pay

## 2021-12-06 ENCOUNTER — Ambulatory Visit (INDEPENDENT_AMBULATORY_CARE_PROVIDER_SITE_OTHER): Payer: Medicaid Other

## 2021-12-06 ENCOUNTER — Ambulatory Visit (HOSPITAL_COMMUNITY)
Admission: EM | Admit: 2021-12-06 | Discharge: 2021-12-06 | Disposition: A | Discharge status: Home / Self Care | Payer: Medicaid Other | Attending: Physician Assistant | Admitting: Physician Assistant

## 2021-12-06 DIAGNOSIS — S62326A Displaced fracture of shaft of fifth metacarpal bone, right hand, initial encounter for closed fracture: Secondary | ICD-10-CM

## 2021-12-06 DIAGNOSIS — M79641 Pain in right hand: Secondary | ICD-10-CM

## 2021-12-06 MED ORDER — HYDROCODONE-ACETAMINOPHEN 5-325 MG PO TABS: 2.0000 | ORAL_TABLET | Freq: Two times a day (BID) | ORAL | 0 refills | Status: AC | PRN

## 2021-12-06 MED ORDER — KETOROLAC TROMETHAMINE 30 MG/ML IJ SOLN
INTRAMUSCULAR | Status: AC
  Filled 2021-12-06: qty 1

## 2021-12-06 MED ORDER — KETOROLAC TROMETHAMINE 30 MG/ML IJ SOLN
30.0000 mg | Freq: Once | INTRAMUSCULAR | Status: AC
  Administered 2021-12-06: 30 mg via INTRAMUSCULAR

## 2021-12-06 NOTE — ED Provider Notes (Signed)
[place]    CSN: [id number] Arrival date & time: 12/06/21  1356      History   Chief Complaint Chief Complaint  Patient presents with   Hand Pain    HPI Tonya Mckenzie is a 61 y.o. female.   Patient presents today for evaluation of right hand pain.  She reports that yesterday she accidentally slammed her hand in the car door.  She reports that pain is rated 11 on a 0-10 pain scale, described as throbbing, worse with palpation or manipulation of the hand, no alleviating factors identified.  She has not tried any over-the-counter medication for symptom management.  She is right-handed.  Denies any numbness or paresthesias.  She reports that pain and swelling are worse today prompting evaluation.    Past Medical History:  Diagnosis Date   Allergic rhinitis    Anxiety    Bacterial vaginosis    Chronic post-traumatic stress disorder (PTSD)    Cocaine abuse ([place])    Depression    ETOH abuse    GERD (gastroesophageal reflux disease)    Hyperlipidemia    Hypertension    off meds due to weight loss   Marijuana abuse    Uterine leiomyoma     Patient Active Problem List   Diagnosis Date Noted   Lumbar radiculopathy 04/15/2021   Pain and swelling of lower leg, right 11/14/2020   Chronic pain of right knee 10/31/2020   Elevated liver enzymes 08/21/2020   Elevated serum protein level 08/21/2020   Yeast vaginitis 08/21/2020   Eustachian tube disorder, bilateral 05/28/2020   Other injury of muscle(s) and tendon(s) of the rotator cuff of left shoulder, subsequent encounter 05/28/2020   Varicose veins of bilateral lower extremities with pain 10/18/2019   Vitamin D deficiency 06/19/2019   Epigastric abdominal pain 05/15/2019   Hyperlipidemia 03/13/2019   Essential hypertension 03/13/2019   PTSD (post-traumatic stress disorder) 03/13/2019   History of substance abuse ([place]) 03/13/2019   Overweight (BMI 25.0-29.9) 03/13/2019   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) 07/09/2017   MDD (major depressive disorder), recurrent severe, without psychosis ([place]) 07/09/2017    Past Surgical History:  Procedure Laterality Date   ABDOMINAL HYSTERECTOMY     left ovaries, took cervix.  Performed for heavy periods and anemia   BICEPT TENODESIS Left 03/06/2020   Procedure: BICEPS TENODESIS;  Surgeon: Hiram Gash, MD;  Location: [facility];  Service: Orthopedics;  Laterality: Left;   CHOLECYSTECTOMY     SHOULDER ARTHROSCOPY WITH ROTATOR CUFF REPAIR AND SUBACROMIAL DECOMPRESSION Left 03/06/2020   Procedure: SHOULDER ARTHROSCOPY WITH ROTATOR CUFF REPAIR AND SUBACROMIAL DECOMPRESSION PARTIAL ACROMIOPLASTY AND DISTAL CLAVICULECTOMY;  Surgeon: Hiram Gash, MD;  Location: [facility];  Service: Orthopedics;  Laterality: Left;   thumb surgery     right   TOE SURGERY Left    TONSILLECTOMY     TYMPANOSTOMY TUBE PLACEMENT      OB History     Gravida  4   Para  4   Term      Preterm      AB      Living         SAB      IAB      Ectopic      Multiple      Live Births               Home Medications    Prior to Admission medications  Medication Sig Start Date End Date Taking? Authorizing Provider  HYDROcodone-acetaminophen (NORCO/VICODIN) 5-325 MG tablet Take 2 tablets by mouth 2 (two) times daily as needed for up to 2 days. 12/06/21 12/08/21 Yes Jackalyn Haith K, PA-C  amLODipine (NORVASC) 5 MG tablet TAKE 1 TABLET(5 MG) BY MOUTH AT BEDTIME. START TAKING WHEN YOU DECREASE YOUR CLONIDINE TO EVERY DAY 03/11/21   Donney Dice, DO  esomeprazole (NEXIUM) 40 MG capsule Take 1 capsule (40 mg total) by mouth daily before breakfast. 11/26/21   Vladimir Crofts, PA-C  ezetimibe (ZETIA) 10 MG tablet TAKE 1 TABLET(10 MG) BY MOUTH DAILY 10/26/21   Ganta, Anupa, DO  famotidine (PEPCID) 40 MG tablet Take 1 tablet (40 mg total) by mouth at bedtime. 11/26/21 11/21/22  Vladimir Crofts, PA-C  gabapentin (NEURONTIN)  100 MG capsule Take 1 capsule (100 mg total) by mouth 2 (two) times daily for 14 days. For pain. 03/06/20 03/20/20  Ethelda Chick, PA-C  ibuprofen (ADVIL) 800 MG tablet Take 1 tablet (800 mg total) by mouth every 8 (eight) hours as needed. 11/18/21   Rafoth, Dorian Pod, MD  Multiple Vitamins-Minerals (WOMENS MULTIVITAMIN PO) Take by mouth. Patient not taking: Reported on 11/26/2021    [provider]  predniSONE (DELTASONE) 10 MG tablet 6 tabs po day 1, 5 tabs po day 2, 4 tabs po day 3, 3 tabs po day 4, 2 tabs po day 5, 1 tab po day 6 Patient not taking: Reported on 11/26/2021 01/28/21   Dene Gentry, MD  rosuvastatin (CRESTOR) 40 MG tablet TAKE 1 TABLET BY MOUTH EVERY DAY, YOU ARE OVERDUE FOR AN APPOINTMENT 11/13/21   Sharion Settler, DO  Vitamin D, Ergocalciferol, (DRISDOL) 1.25 MG (50000 UT) CAPS capsule  08/25/18   [provider]    Family History Family History  Problem Relation Age of Onset   Diabetes Mother    Hypertension Mother    Hypercholesterolemia Mother    Heart attack Father    Heart attack Brother    Colon cancer Neg Hx    Esophageal cancer Neg Hx    Rectal cancer Neg Hx    Stomach cancer Neg Hx    Liver cancer Neg Hx    Breast cancer Neg Hx     Social History Social History   Tobacco Use   Smoking status: Former    Types: Cigarettes   Smokeless tobacco: Never   Tobacco comments:    never really smoked, when she did was about 1 cigarette a month  Vaping Use   Vaping Use: Never used  Substance Use Topics   Alcohol use: Not Currently    Comment: quit drinking 2016   Drug use: Not Currently    Types: Cocaine, Marijuana    Comment: reformed went through rehab     Allergies   Patient has no known allergies.   Review of Systems Review of Systems  Constitutional:  Negative for activity change, appetite change, fatigue and fever.  Musculoskeletal:  Positive for arthralgias and joint swelling. Negative for myalgias.  Skin:  Positive  for color change. Negative for wound.  Neurological:  Negative for weakness and numbness.     Physical Exam Triage Vital Signs ED Triage Vitals  Enc Vitals Group     BP 12/06/21 1434 136/83     Pulse Rate 12/06/21 1434 66     Resp 12/06/21 1434 16     Temp 12/06/21 1434 97.9 F (36.6 C)     Temp Source 12/06/21  1434 Oral     SpO2 12/06/21 1434 98 %     Weight --      Height --      Head Circumference --      Peak Flow --      Pain Score 12/06/21 1435 10     Pain Loc --      Pain Edu? --      Excl. in [place]? --    No data found.  Updated Vital Signs BP 136/83 (BP Location: Left Arm)   Pulse 66   Temp 97.9 F (36.6 C) (Oral)   Resp 16   SpO2 98%   Visual Acuity Right Eye Distance:   Left Eye Distance:   Bilateral Distance:    Right Eye Near:   Left Eye Near:    Bilateral Near:     Physical Exam Vitals reviewed.  Constitutional:      General: She is awake. She is not in acute distress.    Appearance: Normal appearance. She is well-developed. She is not ill-appearing.     Comments: Very pleasant female appears stated age in no acute distress sitting comfortably in exam room  HENT:     Head: Normocephalic and atraumatic.  Cardiovascular:     Rate and Rhythm: Normal rate and regular rhythm.     Pulses:          Radial pulses are 2+ on the right side.     Heart sounds: Normal heart sounds, S1 normal and S2 normal. No murmur heard.    Comments: Capillary refill within 2 seconds right fingers Pulmonary:     Effort: Pulmonary effort is normal.     Breath sounds: Normal breath sounds. No wheezing, rhonchi or rales.     Comments: Clear to auscultation bilaterally Musculoskeletal:     Right hand: Swelling and bony tenderness present. No tenderness. Decreased range of motion. Normal strength. There is no disruption of two-point discrimination. Normal capillary refill.       Arms:     Comments: Right hand: Swelling and pain noted over fourth and fifth MCP joint.  Hand  neurovascularly intact.  Normal active range of motion of phalanges.  Psychiatric:        Behavior: Behavior is cooperative.      UC Treatments / Results  Labs (all labs ordered are listed, but only abnormal results are displayed) Labs Reviewed - No data to display  EKG   Radiology DG Hand Complete Right  Result Date: 12/06/2021 CLINICAL DATA:  Patient states she slammed her right hand on the door for house yesterday EXAM: RIGHT HAND - COMPLETE 3 VIEW COMPARISON:  None Available. FINDINGS: Metallic bracelets obscure visualization of the wrist on the PA view. There is a comminuted minimally displaced fracture of the mid fifth metacarpal with slight dorsal apex angulation. There is no intra-articular extension. No additional acute fracture visualized. There are 2 screws in place at the first MCP joint with mild perihardware lucency. The joint spaces are well-maintained. Mild palmar soft tissue swelling. IMPRESSION: 1. Acute comminuted minimally displaced fracture of the mid fifth metacarpal. 2. Intact screw fixation of the first MCP joint with mild perihardware lucency. Electronically Signed   By: Beryle Flock M.D.   On: 12/06/2021 15:07    Procedures Procedures (including critical care time)  Medications Ordered in UC Medications  ketorolac (TORADOL) 30 MG/ML injection 30 mg (has no administration in time range)    Initial Impression / Assessment and Plan / UC Course  I have reviewed the triage vital signs and the nursing notes.  Pertinent labs & imaging results that were available during my care of the patient were reviewed by me and considered in my medical decision making (see chart for details).     X-ray obtained given mechanism of injury and pain/swelling which showed acute comminuted minimally displaced fracture of the mid fifth metacarpal.  Contacted hand specialist on-call (Dr. Marcelino Scot) who recommended ulnar gutter and pain management.  She was given Toradol injection in  clinic with instruction to take NSAIDs for additional 12 hours.  She was prescribed several doses of hydrocodone with instruction not to drive or drink alcohol with taking this medication.  Review of [place] controlled substance database shows no inappropriate refills.  She was given contact information for hand specialist with instruction to call to schedule an appointment first thing tomorrow.  She was placed in a ulnar gutter by [facility] prior to leaving.  Work excuse note provided.  Discussed that if she has any worsening symptoms including increased pain, weakness, numbness, tingling sensation she needs to be seen immediately.  Strict return precautions given.  Final Clinical Impressions(s) / UC Diagnoses   Final diagnoses:  Closed displaced fracture of shaft of fifth metacarpal bone of right hand, initial encounter     Discharge Instructions      You have a fracture in your hand.  We gave you an injection of Toradol to help with pain today.  Do not take NSAIDs including aspirin, ibuprofen/Advil, naproxen/Aleve for 12 hours.  Use hydrocodone as needed for pain.  This can be sedating so do not drive or drink alcohol with taking this medication.  Make sure that you follow-up with hand specialist; call them to schedule an appointment tomorrow.  If anything worsens and you have increased pain, swelling, numbness, tingling sensation you need to be seen immediately.     ED Prescriptions     Medication Sig Dispense Auth. Provider   HYDROcodone-acetaminophen (NORCO/VICODIN) 5-325 MG tablet Take 2 tablets by mouth 2 (two) times daily as needed for up to 2 days. 4 tablet Joi Leyva K, PA-C      I have reviewed the PDMP during this encounter.   Terrilee Croak, PA-C 12/06/21 1523

## 2021-12-06 NOTE — ED Triage Notes (Signed)
Pt states she slammed her right hand in a door in her house yesterday. States today the pain is worse and it is starting to swell.

## 2021-12-06 NOTE — Progress Notes (Signed)
Orthopedic Tech Progress Note Patient Details:  Tonya Mckenzie 1961-01-17 [id number]  Ortho Devices Type of Ortho Device: Ulna gutter splint Ortho Device/Splint Location: LUE Ortho Device/Splint Interventions: Ordered, Application, Adjustment   Post Interventions Patient Tolerated: Well Instructions Provided: Care of device Rings removed by UC RN, unable to remove bracelets due to swelling of patients hand. Patient against having bracelets cut off, and just pushed them further up her arm and stated she would cut them off if she needed to once she got home. Educated the patient on not removing the splint to remove the bracelets since she would not let us cut them off. Vernona Rieger 12/06/2021, 3:59 PM

## 2021-12-06 NOTE — Discharge Instructions (Signed)
You have a fracture in your hand.  We gave you an injection of Toradol to help with pain today.  Do not take NSAIDs including aspirin, ibuprofen/Advil, naproxen/Aleve for 12 hours.  Use hydrocodone as needed for pain.  This can be sedating so do not drive or drink alcohol with taking this medication.  Make sure that you follow-up with hand specialist; call them to schedule an appointment tomorrow.  If anything worsens and you have increased pain, swelling, numbness, tingling sensation you need to be seen immediately.

## 2021-12-08 ENCOUNTER — Other Ambulatory Visit: Payer: Medicaid Other

## 2021-12-09 ENCOUNTER — Ambulatory Visit: Payer: Medicaid Other | Attending: Family Medicine | Admitting: Physical Therapy

## 2021-12-11 ENCOUNTER — Other Ambulatory Visit: Payer: Self-pay | Admitting: Family Medicine

## 2021-12-11 DIAGNOSIS — I10 Essential (primary) hypertension: Secondary | ICD-10-CM

## 2022-01-07 ENCOUNTER — Other Ambulatory Visit: Payer: Medicaid Other

## 2022-01-07 NOTE — Progress Notes (Deleted)
01/07/2022 Tonya Mckenzie [id number] 1960-03-05  Referring provider: Donney Dice, DO Primary GI doctor: Dr. Loletha Carrow  ASSESSMENT AND PLAN:   GERD 04/2016 EGD for chronic epigastric burning with biopsies unremarkable Status post cholecystectomy Lifestyle changes discussed, avoid NSAIDS, ETOH Can do trial of PPI once daily nexium, with pepcid at night for 6 weeks, then just pepcid Possible muscular component on exam, she does work out. Try salon pas patches.  Talk with PCP about avoiding NSAIDS   No orders of the defined types were placed in this encounter.   History of Present Illness:  61 y.o. female  with a past medical history of hyperlipidemia, hypertension, marijuana use, cocaine use, PTSD with anxiety and depression, diabetes.  And others listed below, returns to clinic today for evaluation of GERD.    Status post cholecystectomy and hysterectomy. 04/2016 EGD for chronic epigastric burning with biopsies unremarkable Started on omeprazole 20 mg 30 minutes before evening meal.  Discussed antireflux measures.  11/26/2021 office visit with myself to trial PPI once daily Nexium, discussed possible muscular component try Salonpas patches. Question pH study versus amitriptyline trial.  Since being seen 12/06/2021 patient had closed displaced fracture of fifth metacarpal right hand with delayed healing status post fixation right small metacarpal fracture.  Patient presents for follow-up. She states the omeprazole did not help, started pepcid OTC takes once in the morning that helps.  She feels like little pins in her stomach left upper.  Last night triggered after drinking a soda, worse with lying down, will feel it come up into her throat.  She denies dysphagia, weight loss. No melena, no hematochezia. No diarrhea, has occ constipation, will use stool softener.  Just started on ibuprofen '800mg'$  once daily about a week ago.   She  reports that she has quit smoking. Her smoking use  included cigarettes. She has never used smokeless tobacco. She reports that she does not currently use alcohol. She reports that she does not currently use drugs after having used the following drugs: Cocaine and Marijuana. Her family history includes Diabetes in her mother; Heart attack in her brother and father; Hypercholesterolemia in her mother; Hypertension in her mother.   Current Medications:   Current Outpatient Medications (Endocrine & Metabolic):    predniSONE (DELTASONE) 10 MG tablet, 6 tabs po day 1, 5 tabs po day 2, 4 tabs po day 3, 3 tabs po day 4, 2 tabs po day 5, 1 tab po day 6 (Patient not taking: Reported on 11/26/2021)  Current Outpatient Medications (Cardiovascular):    amLODipine (NORVASC) 5 MG tablet, TAKE 1 TABLET(5 MG) BY MOUTH AT BEDTIME. START TAKING WHEN YOU DECREASE YOUR CLONIDINE TO EVERY DAY   ezetimibe (ZETIA) 10 MG tablet, TAKE 1 TABLET(10 MG) BY MOUTH DAILY   rosuvastatin (CRESTOR) 40 MG tablet, TAKE 1 TABLET BY MOUTH EVERY DAY, YOU ARE OVERDUE FOR AN APPOINTMENT   Current Outpatient Medications (Analgesics):    ibuprofen (ADVIL) 800 MG tablet, Take 1 tablet (800 mg total) by mouth every 8 (eight) hours as needed.   Current Outpatient Medications (Other):    esomeprazole (NEXIUM) 40 MG capsule, Take 1 capsule (40 mg total) by mouth daily before breakfast.   famotidine (PEPCID) 40 MG tablet, Take 1 tablet (40 mg total) by mouth at bedtime.   gabapentin (NEURONTIN) 100 MG capsule, Take 1 capsule (100 mg total) by mouth 2 (two) times daily for 14 days. For pain.   Multiple Vitamins-Minerals (WOMENS MULTIVITAMIN PO), Take by mouth. (Patient  not taking: Reported on 11/26/2021)   Vitamin D, Ergocalciferol, (DRISDOL) 1.25 MG (50000 UT) CAPS capsule,   Surgical History:  She  has a past surgical history that includes Cholecystectomy; Tympanostomy tube placement; Tonsillectomy; Abdominal hysterectomy; Toe Surgery (Left); thumb surgery; Shoulder arthroscopy with  rotator cuff repair and subacromial decompression (Left, 03/06/2020); and Bicept tenodesis (Left, 03/06/2020).  Current Medications, Allergies, Past Medical History, Past Surgical History, Family History and Social History were reviewed in [employer] record.  Physical Exam: There were no vitals taken for this visit. General:   Pleasant, well developed female in no acute distress Heart : Regular rate and rhythm; no murmurs Pulm: Clear anteriorly; no wheezing Abdomen:  Soft, Non-distended AB, Active bowel sounds. mild tenderness pin point right upper AB tenderness with + carnnett Without guarding and Without rebound, No organomegaly appreciated. Rectal: Not evaluated Extremities:  without  edema. Neurologic:  Alert and  oriented x4;  No focal deficits.  Psych:  Cooperative. Normal mood and affect.   Vladimir Crofts, PA-C 01/07/22

## 2022-01-13 ENCOUNTER — Ambulatory Visit: Payer: Medicaid Other | Admitting: Physician Assistant

## 2022-01-26 ENCOUNTER — Other Ambulatory Visit: Payer: Self-pay | Admitting: Physician Assistant

## 2022-01-28 ENCOUNTER — Other Ambulatory Visit: Payer: Medicaid Other

## 2022-02-11 ENCOUNTER — Other Ambulatory Visit: Payer: Medicaid Other

## 2022-02-15 NOTE — Progress Notes (Unsigned)
02/16/2022 Tonya Mckenzie [id number] 02-12-1961  Referring provider: Donney Dice, DO Primary GI doctor: Dr. Loletha Carrow  ASSESSMENT AND PLAN:  Chronic idiopathic constipation New constipation, most likely from medications, decreased movement No evidence for GI bleeding, weight loss.  Will check labs for anemia, thyroid, dehydration but most likely from medications Continue colace - Increase fiber/ water intake, decrease caffeine, increase activity level. Follow up 6 months, call sooner if any alarm symptoms  Epigastric abdominal pain with GERD Normal EGD 2018 Likely musculoskeletal component Was on NSAIDS, stopped and has been on nexium with help, possible NSAIDS gastritis that resolved. Counseled on NSAIDS use.   Screen colon 04/2014, recall 04/2024 Can consider sooner if any anemia or worsening symptoms  History of Present Illness:  61 y.o. female  with a past medical history of hyperlipidemia, hypertension, marijuana use, cocaine use, PTSD with anxiety and depression, diabetes.  And others listed below, returns to clinic today for evaluation of GERD.   Status post cholecystectomy and hysterectomy.  05/09/2014 colonoscopy Dr. Deatra Ina for screening purposes normal colonoscopy recall 10 years.  Excellent prep with Suprep. 04/2024 04/2016 EGD for chronic epigastric burning with biopsies unremarkable 11/26/2021 office visit for reflux with benign EGD, discussed lifestyle changes, did trial of Nexium and Pepcid.   Also thought possible muscular component on exam discussed trying salon pas patches and avoiding NSAIDs.  Patient presents for follow-up. She feels the nexium once a day is helping. She has stopped the ibuprofen, and never tried lidocaine patches.  She had surgery on her hand 1 month ago after slamming her hand with car truck.  No nausea, no vomiting.  She states her stools are skinny and not using BM as much as before.  She states this has been going on for a while.  She  added colace which helped.  Denies any new medications, has been on crestor/zetia for a while.  No melena, no hematochezia.   She  reports that she has quit smoking. Her smoking use included cigarettes. She has never used smokeless tobacco. She reports that she does not currently use alcohol. She reports that she does not currently use drugs after having used the following drugs: Cocaine and Marijuana. Her family history includes Diabetes in her mother; Heart attack in her brother and father; Hypercholesterolemia in her mother; Hypertension in her mother.   Current Medications:   Current Outpatient Medications (Endocrine & Metabolic):    predniSONE (DELTASONE) 10 MG tablet, 6 tabs po day 1, 5 tabs po day 2, 4 tabs po day 3, 3 tabs po day 4, 2 tabs po day 5, 1 tab po day 6  Current Outpatient Medications (Cardiovascular):    amLODipine (NORVASC) 5 MG tablet, TAKE 1 TABLET(5 MG) BY MOUTH AT BEDTIME. START TAKING WHEN YOU DECREASE YOUR CLONIDINE TO EVERY DAY   ezetimibe (ZETIA) 10 MG tablet, TAKE 1 TABLET(10 MG) BY MOUTH DAILY   rosuvastatin (CRESTOR) 40 MG tablet, TAKE 1 TABLET BY MOUTH EVERY DAY, YOU ARE OVERDUE FOR AN APPOINTMENT   Current Outpatient Medications (Analgesics):    ibuprofen (ADVIL) 800 MG tablet, Take 1 tablet (800 mg total) by mouth every 8 (eight) hours as needed. (Patient not taking: Reported on 02/16/2022)   Current Outpatient Medications (Other):    famotidine (PEPCID) 40 MG tablet, Take 1 tablet (40 mg total) by mouth at bedtime.   Vitamin D, Ergocalciferol, (DRISDOL) 1.25 MG (50000 UT) CAPS capsule,    esomeprazole (NEXIUM) 40 MG capsule, TAKE 1 CAPSULE(40 MG) BY  MOUTH DAILY BEFORE BREAKFAST (Patient not taking: Reported on 02/16/2022)   gabapentin (NEURONTIN) 100 MG capsule, Take 1 capsule (100 mg total) by mouth 2 (two) times daily for 14 days. For pain.   Multiple Vitamins-Minerals (WOMENS MULTIVITAMIN PO), Take by mouth. (Patient not taking: Reported on  02/16/2022)  Surgical History:  She  has a past surgical history that includes Cholecystectomy; Tympanostomy tube placement; Tonsillectomy; Abdominal hysterectomy; Toe Surgery (Left); thumb surgery; Shoulder arthroscopy with rotator cuff repair and subacromial decompression (Left, 03/06/2020); and Bicept tenodesis (Left, 03/06/2020).  Current Medications, Allergies, Past Medical History, Past Surgical History, Family History and Social History were reviewed in [employer] record.  Physical Exam: BP 132/68   Pulse 74   Ht '5\' 6"'$  (1.676 m)   Wt 169 lb (76.7 kg)   BMI 27.28 kg/m  General:   Pleasant, well developed female in no acute distress Heart : Regular rate and rhythm; no murmurs Pulm: Clear anteriorly; no wheezing Abdomen:  Soft, Non-distended AB, Active bowel sounds. mild tenderness pin point right upper AB tenderness with + carnnett Without guarding and Without rebound, No organomegaly appreciated. Rectal: Not evaluated Extremities:  without  edema. Neurologic:  Alert and  oriented x4;  No focal deficits.  Psych:  Cooperative. Normal mood and affect.   Vladimir Crofts, PA-C 02/16/22

## 2022-02-16 ENCOUNTER — Other Ambulatory Visit (INDEPENDENT_AMBULATORY_CARE_PROVIDER_SITE_OTHER): Payer: Medicaid Other

## 2022-02-16 ENCOUNTER — Ambulatory Visit (INDEPENDENT_AMBULATORY_CARE_PROVIDER_SITE_OTHER): Payer: Medicaid Other | Admitting: Physician Assistant

## 2022-02-16 ENCOUNTER — Other Ambulatory Visit: Payer: Self-pay

## 2022-02-16 ENCOUNTER — Encounter: Payer: Self-pay | Admitting: Physician Assistant

## 2022-02-16 VITALS — BP 132/68 | HR 74 | Ht 66.0 in | Wt 169.0 lb

## 2022-02-16 DIAGNOSIS — K5904 Chronic idiopathic constipation: Secondary | ICD-10-CM

## 2022-02-16 DIAGNOSIS — K219 Gastro-esophageal reflux disease without esophagitis: Secondary | ICD-10-CM

## 2022-02-16 DIAGNOSIS — R1013 Epigastric pain: Secondary | ICD-10-CM

## 2022-02-16 DIAGNOSIS — R7989 Other specified abnormal findings of blood chemistry: Secondary | ICD-10-CM

## 2022-02-16 LAB — IBC + FERRITIN
Ferritin: 86.7 ng/mL (ref 10.0–291.0)
Iron: 76 ug/dL (ref 42–145)
Saturation Ratios: 21.7 % (ref 20.0–50.0)
TIBC: 350 ug/dL (ref 250.0–450.0)
Transferrin: 250 mg/dL (ref 212.0–360.0)

## 2022-02-16 LAB — CBC WITH DIFFERENTIAL/PLATELET
Basophils Absolute: 0 10*3/uL (ref 0.0–0.1)
Basophils Relative: 0.6 % (ref 0.0–3.0)
Eosinophils Absolute: 0.1 10*3/uL (ref 0.0–0.7)
Eosinophils Relative: 1.4 % (ref 0.0–5.0)
HCT: 41.2 % (ref 36.0–46.0)
Hemoglobin: 13.6 g/dL (ref 12.0–15.0)
Lymphocytes Relative: 27.2 % (ref 12.0–46.0)
Lymphs Abs: 1.9 10*3/uL (ref 0.7–4.0)
MCHC: 33.1 g/dL (ref 30.0–36.0)
MCV: 89.3 fl (ref 78.0–100.0)
Monocytes Absolute: 0.6 10*3/uL (ref 0.1–1.0)
Monocytes Relative: 7.9 % (ref 3.0–12.0)
Neutro Abs: 4.4 10*3/uL (ref 1.4–7.7)
Neutrophils Relative %: 62.9 % (ref 43.0–77.0)
Platelets: 213 10*3/uL (ref 150.0–400.0)
RBC: 4.61 Mil/uL (ref 3.87–5.11)
RDW: 13.3 % (ref 11.5–15.5)
WBC: 7 10*3/uL (ref 4.0–10.5)

## 2022-02-16 LAB — COMPREHENSIVE METABOLIC PANEL
ALT: 171 U/L — ABNORMAL HIGH (ref 0–35)
AST: 100 U/L — ABNORMAL HIGH (ref 0–37)
Albumin: 4.1 g/dL (ref 3.5–5.2)
Alkaline Phosphatase: 86 U/L (ref 39–117)
BUN: 10 mg/dL (ref 6–23)
CO2: 28 mEq/L (ref 19–32)
Calcium: 9.1 mg/dL (ref 8.4–10.5)
Chloride: 108 mEq/L (ref 96–112)
Creatinine, Ser: 0.99 mg/dL (ref 0.40–1.20)
GFR: 62.25 mL/min (ref >60.00)
Glucose, Bld: 93 mg/dL (ref 70–99)
Potassium: 3.7 mEq/L (ref 3.5–5.1)
Sodium: 143 mEq/L (ref 135–145)
Total Bilirubin: 0.4 mg/dL (ref 0.2–1.2)
Total Protein: 6.3 g/dL (ref 6.0–8.3)

## 2022-02-16 LAB — TSH: TSH: 1.28 u[IU]/mL (ref 0.35–5.50)

## 2022-02-16 NOTE — Patient Instructions (Addendum)
_______________________________________________________  If you are age 59 or older, your body mass index should be between 23-30. Your Body mass index is 27.28 kg/m. If this is out of the aforementioned range listed, please consider follow up with your Primary Care Provider.  If you are age 20 or younger, your body mass index should be between 19-25. Your Body mass index is 27.28 kg/m. If this is out of the aformentioned range listed, please consider follow up with your Primary Care Provider.   ________________________________________________________  The [place] [facility] providers would like to encourage you to use [facility] to communicate with providers for non-urgent requests or questions.  Due to long hold times on the telephone, sending your provider a message by [facility] may be a faster and more efficient way to get a response.  Please allow 48 business hours for a response.  Please remember that this is for non-urgent requests.  _______________________________________________________  Please follow up in 6 months. Give [place] a call at [phone] to schedule an appointment.   Your provider has requested that you go to the basement level for lab work before leaving today. Press "B" on the elevator. The lab is located at the first door on the left as you exit the elevator.   Recommend starting on a fiber supplement, can try metamucil first but if this causes gas/bloating switch to benefiber or citracel, these do not cause gas.  Take with fiber with with a full 8 oz glass of water once a day. This can take 1 month to start helping, so try for at least one month.  Recommend increasing water and physical activity.   - Drink at least 64-80 ounces of water/liquid per day. - Establish a time to try to move your bowels every day.  For many people, this is after a cup of coffee or after a meal such as breakfast. - Sit all of the way back on the toilet keeping your back fairly straight and while sitting  up, try to rest the tops of your forearms on your upper thighs.   - Raising your feet with a step stool/squatty potty can be helpful to improve the angle that allows your stool to pass through the rectum. - Relax the rectum feeling it bulge toward the toilet water.  If you feel your rectum raising toward your body, you are contracting rather than relaxing. - Breathe in and slowly exhale. "Belly breath" by expanding your belly towards your belly button. Keep belly expanded as you gently direct pressure down and back to the anus.  A low pitched GRRR sound can assist with increasing intra-abdominal pressure.  - Repeat 3-4 times. If unsuccessful, contract the pelvic floor to restore normal tone and get off the toilet.  Avoid excessive straining. - To reduce excessive wiping by teaching your anus to normally contract, place hands on outer aspect of knees and resist knee movement outward.  Hold 5-10 second then place hands just inside of knees and resist inward movement of knees.  Hold 5 seconds.  Repeat a few times each way.  Go to the ER if unable to pass gas, severe AB pain, unable to hold down food, any shortness of breath of chest pain.

## 2022-02-16 NOTE — Progress Notes (Signed)
____________________________________________________________  Attending physician addendum:  Thank you for sending this case to me. I have reviewed the entire note and agree with the plan.   Amed Datta Danis, MD  ____________________________________________________________  

## 2022-03-05 ENCOUNTER — Other Ambulatory Visit: Payer: Self-pay

## 2022-03-05 ENCOUNTER — Telehealth: Payer: Self-pay | Admitting: Family Medicine

## 2022-03-05 DIAGNOSIS — E785 Hyperlipidemia, unspecified: Secondary | ICD-10-CM

## 2022-03-05 MED ORDER — ROSUVASTATIN CALCIUM 40 MG PO TABS: ORAL_TABLET | ORAL | 0 refills | Status: DC

## 2022-03-05 NOTE — Telephone Encounter (Signed)
Patient came in stating that she needs a refill for Rosuvastatin. She only has a couple days left.

## 2022-03-07 ENCOUNTER — Other Ambulatory Visit: Payer: Self-pay | Admitting: Family Medicine

## 2022-03-07 DIAGNOSIS — E785 Hyperlipidemia, unspecified: Secondary | ICD-10-CM

## 2022-03-09 ENCOUNTER — Ambulatory Visit (INDEPENDENT_AMBULATORY_CARE_PROVIDER_SITE_OTHER): Payer: Medicaid Other | Admitting: Family Medicine

## 2022-03-09 ENCOUNTER — Encounter: Payer: Self-pay | Admitting: Family Medicine

## 2022-03-09 ENCOUNTER — Ambulatory Visit: Admission: RE | Admit: 2022-03-09 | Payer: Medicaid Other | Source: Ambulatory Visit

## 2022-03-09 VITALS — BP 114/79 | HR 71 | Ht 66.0 in | Wt 169.0 lb

## 2022-03-09 DIAGNOSIS — E785 Hyperlipidemia, unspecified: Secondary | ICD-10-CM | POA: Diagnosis not present

## 2022-03-09 DIAGNOSIS — H6993 Unspecified Eustachian tube disorder, bilateral: Secondary | ICD-10-CM

## 2022-03-09 DIAGNOSIS — R7989 Other specified abnormal findings of blood chemistry: Secondary | ICD-10-CM | POA: Insufficient documentation

## 2022-03-09 DIAGNOSIS — I10 Essential (primary) hypertension: Secondary | ICD-10-CM | POA: Diagnosis not present

## 2022-03-09 MED ORDER — AMLODIPINE BESYLATE 5 MG PO TABS: ORAL_TABLET | ORAL | 3 refills | Status: DC

## 2022-03-09 MED ORDER — ROSUVASTATIN CALCIUM 40 MG PO TABS: ORAL_TABLET | ORAL | 0 refills | Status: DC

## 2022-03-09 MED ORDER — EZETIMIBE 10 MG PO TABS: ORAL_TABLET | ORAL | 3 refills | Status: DC

## 2022-03-09 MED ORDER — ESOMEPRAZOLE MAGNESIUM 40 MG PO CPDR: DELAYED_RELEASE_CAPSULE | ORAL | 2 refills | Status: DC

## 2022-03-09 MED ORDER — FLUTICASONE PROPIONATE 50 MCG/ACT NA SUSP: 2.0000 | Freq: Every day | NASAL | 6 refills | Status: DC

## 2022-03-09 NOTE — Patient Instructions (Addendum)
It was great seeing you today!  I have refilled your medications, we will be in touch to further review your medications. We will get blood work today and I will let you know of any abnormal results.   I have prescribed flonase, see if this improves your ear symptoms.   Please follow up at your next scheduled appointment, if anything arises between now and then, please don't hesitate to contact our office.   Thank you for allowing [place] to be a part of your medical care!  Thank you, Dr. Larae Grooms  Also a reminder of our clinic's no-show policy. Please make sure to arrive at least 15 minutes prior to your scheduled appointment time. Please try to cancel before 24 hours if you are not able to make it. If you no-show for 2 appointments then you will be receiving a warning letter. If you no-show after 3 visits, then you may be at risk of being dismissed from our clinic. This is to ensure that everyone is able to be seen in a timely manner. Thank you, we appreciate your assistance with this!

## 2022-03-09 NOTE — Assessment & Plan Note (Signed)
-  BP 114/79, at goal -continue amlodipine

## 2022-03-09 NOTE — Assessment & Plan Note (Signed)
-  per chart review noted to have elevated LFTs up until even recently from a month ago, reassuringly patient does not have abdominal pain, jaundice or other symptoms indicating biliary or worsening process -repeat CMP and adding hepatitis panel -follow up as appropriate based off of blood work obtained today, patient in agreement with current plan in place and follow up recommendations

## 2022-03-09 NOTE — Progress Notes (Signed)
    SUBJECTIVE:   CHIEF COMPLAINT / HPI:   Patient presents for routine follow up as she needs her medications refilled. Denies any concerns other than the fact that she has been experience intermittent popping in her ears with occasional tinnitus. This has been going on for awhile since she has had a eustachian tube dysfunction as a child. Denies any neck pain, hearing loss or other symptoms. History of hypertension and admits to sometimes skipping a dose of her amlodipine but denies any symptoms.   OBJECTIVE:   BP 114/79   Pulse 71   Ht '5\' 6"'$  (1.676 m)   Wt 169 lb (76.7 kg)   SpO2 100%   BMI 27.28 kg/m   General: Patient well-appearing, in no acute distress. HEENT: non-tender thyroid, no evidence of cervical LAD, no tenderness upon palpation of lateral neck, nonbulging TM bilaterally without erythema or drainage noted CV: RRR, no murmurs or gallops auscultated  Resp: CTAB, no wheezing, rales or rhonchi noted Abdomen: soft, nontender, nondistended, presence of bowel sounds Ext: radial pulses present bilaterally  ASSESSMENT/PLAN:   Essential hypertension -BP 114/79, at goal -continue amlodipine  Eustachian tube disorder, bilateral -symptoms likely secondary to eustachian tube dysfunction but also considered TMJ dysfunction. No red flag symptoms. -flonase prescribed -follow up as appropriate   Hyperlipidemia -pending lipid panel -continue statin therapy and zetia  Elevated LFTs -per chart review noted to have elevated LFTs up until even recently from a month ago, reassuringly patient does not have abdominal pain, jaundice or other symptoms indicating biliary or worsening process -repeat CMP and adding hepatitis panel -follow up as appropriate based off of blood work obtained today, patient in agreement with current plan in place and follow up recommendations   Health maintenance -Up to date on Hep C and HIV screening -Next mammogram due March 2025 -colonoscopy due March  2026 -Med rec reviewed and updated appropriately although patient did not bring her medications so will call patient when blood work results return and discuss the meds she is currently taking to verify  -PHQ-9 score of 0 reviewed.   Donney Dice, [place]

## 2022-03-09 NOTE — Assessment & Plan Note (Signed)
-  pending lipid panel -continue statin therapy and zetia

## 2022-03-09 NOTE — Assessment & Plan Note (Signed)
-  symptoms likely secondary to eustachian tube dysfunction but also considered TMJ dysfunction. No red flag symptoms. -flonase prescribed -follow up as appropriate

## 2022-03-10 ENCOUNTER — Other Ambulatory Visit: Payer: Medicaid Other

## 2022-03-10 LAB — COMPREHENSIVE METABOLIC PANEL
ALT: 141 IU/L — ABNORMAL HIGH (ref 0–32)
AST: 67 IU/L — ABNORMAL HIGH (ref 0–40)
Albumin/Globulin Ratio: 2.9 — ABNORMAL HIGH (ref 1.2–2.2)
Albumin: 4.7 g/dL (ref 3.8–4.9)
Alkaline Phosphatase: 106 IU/L (ref 44–121)
BUN/Creatinine Ratio: 15 (ref 9–23)
BUN: 14 mg/dL (ref 6–24)
Bilirubin Total: 0.4 mg/dL (ref 0.0–1.2)
CO2: 23 mmol/L (ref 20–29)
Calcium: 9.7 mg/dL (ref 8.7–10.2)
Chloride: 105 mmol/L (ref 96–106)
Creatinine, Ser: 0.93 mg/dL (ref 0.57–1.00)
Globulin, Total: 1.6 g/dL (ref 1.5–4.5)
Glucose: 99 mg/dL (ref 70–99)
Potassium: 4.3 mmol/L (ref 3.5–5.2)
Sodium: 144 mmol/L (ref 134–144)
Total Protein: 6.3 g/dL (ref 6.0–8.5)
eGFR: 71 mL/min/{1.73_m2} (ref >59)

## 2022-03-10 LAB — LIPID PANEL
Chol/HDL Ratio: 2.2 ratio (ref 0.0–4.4)
Cholesterol, Total: 204 mg/dL — ABNORMAL HIGH (ref 100–199)
HDL: 92 mg/dL (ref >39)
LDL Chol Calc (NIH): 101 mg/dL — ABNORMAL HIGH (ref 0–99)
Triglycerides: 60 mg/dL (ref 0–149)
VLDL Cholesterol Cal: 11 mg/dL (ref 5–40)

## 2022-03-10 LAB — ACUTE VIRAL HEPATITIS (HAV, HBV, HCV)
HCV Ab: NONREACTIVE
Hep A IgM: NEGATIVE
Hep B C IgM: NEGATIVE
Hepatitis B Surface Ag: NEGATIVE

## 2022-03-10 LAB — HCV INTERPRETATION

## 2022-03-21 ENCOUNTER — Other Ambulatory Visit: Payer: Medicaid Other

## 2022-04-16 ENCOUNTER — Other Ambulatory Visit: Payer: Self-pay | Admitting: Family Medicine

## 2022-04-16 DIAGNOSIS — Z1231 Encounter for screening mammogram for malignant neoplasm of breast: Secondary | ICD-10-CM

## 2022-04-19 ENCOUNTER — Ambulatory Visit: Payer: Medicaid Other | Admitting: Family Medicine

## 2022-04-26 ENCOUNTER — Ambulatory Visit: Payer: Medicaid Other | Admitting: Family Medicine

## 2022-04-26 VITALS — BP 116/82 | Ht 66.0 in | Wt 168.0 lb

## 2022-04-26 DIAGNOSIS — M5416 Radiculopathy, lumbar region: Secondary | ICD-10-CM | POA: Diagnosis present

## 2022-04-26 MED ORDER — KETOROLAC TROMETHAMINE 60 MG/2ML IM SOLN
60.0000 mg | Freq: Once | INTRAMUSCULAR | Status: AC
  Administered 2022-04-26: 60 mg via INTRAMUSCULAR

## 2022-04-26 NOTE — Progress Notes (Unsigned)
PCP: Donney Dice, DO  Subjective:   HPI: Patient is a 61 y.o. female here for bilateral leg pain.  Last seen in sports medicine clinic 11/18/2021 for bilateral hamstring pain with weakness of right hamstring.  MRI was ordered along with order for physical therapy.  Patient did not MRI done yet and did not go to PT as pain resolved on its own without medications. Of note, had lumbar x-rays  Today pt states pain she had in September is back for the past 4-5 days. Describes it as sore muscles pulling in the back of her thighs. Denies any numbess, tingling, or back pain. It bothers her in the mornings (10/10 pain), better after moving around and stretching (2/10 pain). Doesn't bother her much during the day if she continues to move around but worsens after sitting for prolonged period of time and she goes to stand up. Then lightens up after moving around again. Not currently taking any medication for the pain, doesn't like taking pills, would prefer an injection for pain control which she requests today.   Does work out at [employer] at least 3 times/week. Gets on the treadmill, if uses inclination, has more hamstring pain after exercising.    Past Medical History:  Diagnosis Date   Allergic rhinitis    Anxiety    Bacterial vaginosis    Chronic post-traumatic stress disorder (PTSD)    Cocaine abuse ([place])    Depression    ETOH abuse    GERD (gastroesophageal reflux disease)    Hyperlipidemia    Hypertension    off meds due to weight loss   Marijuana abuse    Uterine leiomyoma     Current Outpatient Medications on File Prior to Visit  Medication Sig Dispense Refill   amLODipine (NORVASC) 5 MG tablet TAKE 1 TABLET(5 MG) BY MOUTH AT BEDTIME. START TAKING WHEN YOU DECREASE YOUR CLONIDINE TO EVERY DAY 90 tablet 3   esomeprazole (NEXIUM) 40 MG capsule TAKE 1 CAPSULE(40 MG) BY MOUTH DAILY BEFORE BREAKFAST 30 capsule 2   ezetimibe (ZETIA) 10 MG tablet TAKE 1 TABLET(10 MG) BY MOUTH DAILY 90  tablet 3   fluticasone (FLONASE) 50 MCG/ACT nasal spray Place 2 sprays into both nostrils daily. 16 g 6   rosuvastatin (CRESTOR) 40 MG tablet TAKE 1 TABLET BY MOUTH EVERY DAY. YOU ARE OVERDUE FOR AN APPT 90 tablet 0   Vitamin D, Ergocalciferol, (DRISDOL) 1.25 MG (50000 UT) CAPS capsule  (Patient not taking: Reported on 03/09/2022)     No current facility-administered medications on file prior to visit.    Past Surgical History:  Procedure Laterality Date   ABDOMINAL HYSTERECTOMY     left ovaries, took cervix.  Performed for heavy periods and anemia   BICEPT TENODESIS Left 03/06/2020   Procedure: BICEPS TENODESIS;  Surgeon: Hiram Gash, MD;  Location: [facility];  Service: Orthopedics;  Laterality: Left;   CHOLECYSTECTOMY     SHOULDER ARTHROSCOPY WITH ROTATOR CUFF REPAIR AND SUBACROMIAL DECOMPRESSION Left 03/06/2020   Procedure: SHOULDER ARTHROSCOPY WITH ROTATOR CUFF REPAIR AND SUBACROMIAL DECOMPRESSION PARTIAL ACROMIOPLASTY AND DISTAL CLAVICULECTOMY;  Surgeon: Hiram Gash, MD;  Location: [facility];  Service: Orthopedics;  Laterality: Left;   thumb surgery     right   TOE SURGERY Left    TONSILLECTOMY     TYMPANOSTOMY TUBE PLACEMENT      No Known Allergies  Social History   Socioeconomic History   Marital status: Married    Spouse  name: Tyrone   Number of children: 4   Years of education: Not on file   Highest education level: Not on file  Occupational History   Occupation: student    Comment: [employer]  Tobacco Use   Smoking status: Former    Types: Cigarettes   Smokeless tobacco: Never   Tobacco comments:    never really smoked, when she did was about 1 cigarette a month  Vaping Use   Vaping Use: Never used  Substance and Sexual Activity   Alcohol use: Not Currently    Comment: quit drinking 2016   Drug use: Not Currently    Types: Cocaine, Marijuana    Comment: reformed went through rehab   Sexual activity: Not on file  Other  Topics Concern   Not on file  Social History Narrative   Not on file   Social Determinants of Health   Financial Resource Strain: Not on file  Food Insecurity: Not on file  Transportation Needs: Not on file  Physical Activity: Not on file  Stress: Not on file  Social Connections: Not on file  Intimate Partner Violence: Not on file    Family History  Problem Relation Age of Onset   Diabetes Mother    Hypertension Mother    Hypercholesterolemia Mother    Heart attack Father    Heart attack Brother    Colon cancer Neg Hx    Esophageal cancer Neg Hx    Rectal cancer Neg Hx    Stomach cancer Neg Hx    Liver cancer Neg Hx    Breast cancer Neg Hx     BP 116/82   Ht '5\' 6"'$  (1.676 m)   Wt 168 lb (76.2 kg)   BMI 27.12 kg/m   Review of Systems: See HPI above.     Objective:  Physical Exam:  Gen: awake, alert, NAD, comfortable in exam room Pulm: breathing unlabored  Lower Extremities: - Inspection: No gross deformity, no swelling - Palpation: No TTP over greater trochanter, IT bands, piriformis, or hamstring muscle bellies bilaterally - ROM: Normal range of motion on Flexion, extension, abduction, internal and external rotation of hips bilaterally  - Strength: Normal hip strength and hamstring strength bilaterally. - Neuro/vasc: NV intact distally - Special Tests: Negative FABER, negative thomas test    Assessment & Plan:    Lumbar radiculopathy Will not obtain MRI at this time as it would not change management today. As pain is described as 10/10 at times, more likely to be nerve related than just tight hamstrings. Pain worse with going up hill could be related to disk bulging posteriorly when bending forward.  - formal PT - home exercises provided to strengthen hamstrings - Toradol injection today  - return in 1 month, can consider MRI if not improvement      Precious Gilding, DO Family Medicine Resident, PGY-2

## 2022-04-26 NOTE — Assessment & Plan Note (Signed)
Will not obtain MRI at this time as it would not change management today. As pain is described as 10/10 at times, more likely to be nerve related than just tight hamstrings. Pain worse with going up hill could be related to disk bulging posteriorly when bending forward.  - formal PT - home exercises provided to strengthen hamstrings - Toradol injection today  - return in 1 month, can consider MRI if not improvement

## 2022-04-26 NOTE — Patient Instructions (Signed)
While the pain is in your hamstrings this may be due to an irritated nerve in your low back. Start physical therapy at least for a few visits. You were given an injection of toradol today. Do home exercises on days you don't go to therapy. Follow up with [place] in 1 month for reevaluation. Consider MRI of your lumbar spine if not improving.

## 2022-04-27 ENCOUNTER — Encounter: Payer: Self-pay | Admitting: Family Medicine

## 2022-05-03 ENCOUNTER — Ambulatory Visit: Payer: Self-pay | Admitting: Family Medicine

## 2022-05-03 ENCOUNTER — Encounter: Payer: Self-pay | Admitting: Physician Assistant

## 2022-05-18 ENCOUNTER — Ambulatory Visit: Payer: Medicaid Other | Admitting: Family Medicine

## 2022-05-25 ENCOUNTER — Ambulatory Visit (INDEPENDENT_AMBULATORY_CARE_PROVIDER_SITE_OTHER): Payer: Medicaid Other | Admitting: Family Medicine

## 2022-05-25 DIAGNOSIS — Z91199 Patient's noncompliance with other medical treatment and regimen due to unspecified reason: Secondary | ICD-10-CM

## 2022-05-25 NOTE — Progress Notes (Signed)
Patient had car trouble and had to reschedule appointment, started encounter in error.

## 2022-05-26 ENCOUNTER — Ambulatory Visit (INDEPENDENT_AMBULATORY_CARE_PROVIDER_SITE_OTHER): Payer: Medicaid Other | Admitting: Student

## 2022-05-26 VITALS — BP 130/84 | HR 64 | Ht 66.0 in | Wt 172.0 lb

## 2022-05-26 DIAGNOSIS — M79651 Pain in right thigh: Secondary | ICD-10-CM | POA: Diagnosis present

## 2022-05-26 DIAGNOSIS — M79652 Pain in left thigh: Secondary | ICD-10-CM | POA: Diagnosis not present

## 2022-05-26 DIAGNOSIS — M79642 Pain in left hand: Secondary | ICD-10-CM | POA: Diagnosis not present

## 2022-05-26 DIAGNOSIS — M79641 Pain in right hand: Secondary | ICD-10-CM

## 2022-05-26 MED ORDER — MELOXICAM 7.5 MG PO TABS: 7.5000 mg | ORAL_TABLET | Freq: Every day | ORAL | 0 refills | Status: DC | PRN

## 2022-05-26 NOTE — Progress Notes (Signed)
SUBJECTIVE:   CHIEF COMPLAINT / HPI:   Bilateral Leg Pain:  Patient presents reporting of ongoing bilateral hamstring pain that radiates down past the knee into the mid calf. She has been to sports medicine regarding this concern, was diagnosed with hamstring tightness and was given a Toradol injection.  She has not yet started formal physical therapy Patient is taking statin regularly but has for quite some time.  No recent increase in dosing Patient reports that when she stands for long periods of time, she can feel muscle aches and cramping in the hamstrings Patient reports that she has no fever or chills, no nausea vomiting or diarrhea, no abdominal pain, no chest pain, no dyspnea  Bilateral hand pain Patient has what she describes as dull pain in her hands that are worse in the morning and then improves throughout the day.  No warmth or swelling of the hands noted.  She tries to keep her hands active and mobile to prevent pain   PERTINENT  PMH / PSH:  HTN Varicose veins  Cocaine use disorder  PTSD Overweight  MDD HLD  H/o Substance use  HLD  Elevated LFTs   Patient Care Team: Donney Dice, DO as PCP - General (Family Medicine) OBJECTIVE:  BP 130/84   Pulse 64   Ht 5\' 6"  (1.676 m)   Wt 172 lb (78 kg)   SpO2 100%   BMI 27.76 kg/m  Physical Exam  General: Alert and oriented in no apparent distress Heart: Regular rate and rhythm with no murmurs appreciated Lungs: normal WOB Skin: Warm and dry Extremities: No lower extremity edema NTTP over posterior upper or lower legs  Palpable DP pulses bilaterally  No Lumbar pain  Able to ambulate appropriately  5/5 LE strength   Hand Exam:  Full ROM of the fingers on both hands  Neurovascularly intact  NTTP over joints of the hand  No swelling or erythema  FROM wrist    ASSESSMENT/PLAN:  Pain in both thighs Assessment & Plan: VVS consult for ABIs, currently on statin which could be contributing but will first  evaluate for other causes. No evidence of lumbar pain or neurogenic claudication without red flag symptoms.  Additionally, will check for electrolyte abnormality with labs, magnesium and CMP.  Patient has a history of elevated LFTs, will obtain CMP for trend. Return in 4 weeks. She can also return to [facility] if she would like, continue with formal PT.   Orders: -     Ambulatory referral to Vascular Surgery -     Magnesium -     Comprehensive metabolic panel -     Meloxicam; Take 1 tablet (7.5 mg total) by mouth daily as needed for pain.  Dispense: 15 tablet; Refill: 0  Pain in both hands Assessment & Plan: Likely OA, unlikely to be related to autoimmune dx given age and lack of systemic symptoms.  No other obvious infectious, metabolic, vascular causes on physical exam.  We will continue with x-rays of the hands primarily.  History of surgical intervention on the right for hand fracture with pinning. Also, could contribute to worsening OA. Meloxicam for inflammation while obtaining XR.   Orders: -     DG Hand Complete Left; Future -     DG Hand Complete Right; Future -     Meloxicam; Take 1 tablet (7.5 mg total) by mouth daily as needed for pain.  Dispense: 15 tablet; Refill: 0   Return in about 4 weeks (around 06/23/2022) for hamstring  pain . Erskine Emery, MD 05/26/2022, 11:49 AM PGY-2, [place]

## 2022-05-26 NOTE — Progress Notes (Deleted)
  SUBJECTIVE:   CHIEF COMPLAINT / HPI:   Follow up:   Pain down the legs bilateral   PERTINENT  PMH / PSH:  HTN Varicose veins  Cocaine use disorder  PTSD Overweight  MDD HLD  H/o Substance use  HLD  Elevated LFTs   Patient Care Team: Donney Dice, DO as PCP - General (Family Medicine) OBJECTIVE:  BP 130/84   Pulse 64   Ht 5\' 6"  (1.676 m)   Wt 172 lb (78 kg)   SpO2 100%   BMI 27.76 kg/m  Physical Exam   ASSESSMENT/PLAN:  There are no diagnoses linked to this encounter. No follow-ups on file. Erskine Emery, MD 05/26/2022, 10:31 AM PGY-***, [facility] {    This will disappear when note is signed, click to select method of visit    :1}

## 2022-05-26 NOTE — Patient Instructions (Addendum)
It was great to see you today! Thank you for choosing [facility] for your primary care. Tonya Mckenzie was seen for follow up.  Today we addressed: I will send you to the vascular surgeon to test your blood flow  We will order BMP magnesium to check your electrolytes  Stay hydrated  Try to take Meloxicam for pain control while we work this up  I will obtain an XR of the hands    If you haven't already, sign up for My Chart to have easy access to your labs results, and communication with your primary care physician.  I recommend that you always bring your medications to each appointment as this makes it easy to ensure you are on the correct medications and helps [place] not miss refills when you need them. Call the clinic at [phone] if your symptoms worsen or you have any concerns.  You should return to our clinic Return in about 4 weeks (around 06/23/2022) for hamstring pain . Please arrive 15 minutes before your appointment to ensure smooth check in process.  We appreciate your efforts in making this happen.  Thank you for allowing me to participate in your care, Erskine Emery, MD 05/26/2022, 10:42 AM PGY-2, [place]

## 2022-05-26 NOTE — Assessment & Plan Note (Signed)
VVS consult for ABIs, currently on statin which could be contributing but will first evaluate for other causes. No evidence of lumbar pain or neurogenic claudication without red flag symptoms.  Additionally, will check for electrolyte abnormality with labs, magnesium and CMP.  Patient has a history of elevated LFTs, will obtain CMP for trend. Return in 4 weeks. She can also return to [facility] if she would like, continue with formal PT.

## 2022-05-26 NOTE — Assessment & Plan Note (Signed)
Likely OA, unlikely to be related to autoimmune dx given age and lack of systemic symptoms.  No other obvious infectious, metabolic, vascular causes on physical exam.  We will continue with x-rays of the hands primarily.  History of surgical intervention on the right for hand fracture with pinning. Also, could contribute to worsening OA. Meloxicam for inflammation while obtaining XR.

## 2022-05-27 ENCOUNTER — Ambulatory Visit: Payer: Medicaid Other | Admitting: Sports Medicine

## 2022-05-27 VITALS — BP 124/82 | Ht 66.0 in | Wt 168.0 lb

## 2022-05-27 DIAGNOSIS — M5416 Radiculopathy, lumbar region: Secondary | ICD-10-CM

## 2022-05-27 DIAGNOSIS — M79652 Pain in left thigh: Secondary | ICD-10-CM | POA: Diagnosis not present

## 2022-05-27 DIAGNOSIS — M79651 Pain in right thigh: Secondary | ICD-10-CM

## 2022-05-27 LAB — COMPREHENSIVE METABOLIC PANEL
ALT: 54 IU/L — ABNORMAL HIGH (ref 0–32)
AST: 29 IU/L (ref 0–40)
Albumin/Globulin Ratio: 1.9 (ref 1.2–2.2)
Albumin: 4.3 g/dL (ref 3.8–4.9)
Alkaline Phosphatase: 111 IU/L (ref 44–121)
BUN/Creatinine Ratio: 12 (ref 12–28)
BUN: 13 mg/dL (ref 8–27)
Bilirubin Total: 0.2 mg/dL (ref 0.0–1.2)
CO2: 24 mmol/L (ref 20–29)
Calcium: 9.5 mg/dL (ref 8.7–10.3)
Chloride: 105 mmol/L (ref 96–106)
Creatinine, Ser: 1.07 mg/dL — ABNORMAL HIGH (ref 0.57–1.00)
Globulin, Total: 2.3 g/dL (ref 1.5–4.5)
Glucose: 102 mg/dL — ABNORMAL HIGH (ref 70–99)
Potassium: 4.3 mmol/L (ref 3.5–5.2)
Sodium: 141 mmol/L (ref 134–144)
Total Protein: 6.6 g/dL (ref 6.0–8.5)
eGFR: 59 mL/min/{1.73_m2} — ABNORMAL LOW (ref >59)

## 2022-05-27 LAB — MAGNESIUM: Magnesium: 2 mg/dL (ref 1.6–2.3)

## 2022-05-27 NOTE — Progress Notes (Signed)
SUBJECTIVE:   CHIEF COMPLAINT / HPI: Bilateral posterior thigh pain  Seen multiple times by [facility] for this complaint for past year. Las seen 2/26 for lumbar radiculopathy/bilateral hamstring pain. Received toradol shot at that time which seemed to help with pain. MRI ordered but not gotten yet. Seen in [facility] clinic yesterday for similar pain-prescribed meloxicam but patient has not picked this up as she prefers IM injections instead of oral pills. Checked mag (normal), lytes and LFTs (slightly elevated). They placed VVS consult for ABIs. She is on long standing statin therapy (rosuvastatin 40 mg) as well which could be contributing.  Today she says the pain is worst when she wakes up in the morning. It feels better after she flexes forward and also after rubbing the backs of her thighs. The pain is in her bilateral buttocks, hamstrings and down to calf but never to her foot. She says moving around helps her feel better. She does exercise bike and walking on treadmill which helps. She cannot walk at an incline because that makes it hurt much more. She describes the pain as a sore feeling - "like after you have had a hard workout." No shocks down to feet. No weakness/sensation loss.  She says that every morning she gets in a tub with hot water and massages her legs which has been helping. She says she feels this sensation everyday but some days are better than others. No recent trauma to back- she says maybe 20-30 years ago she had possibly fractured her tailbone but didn't go to doctor and just used donut pillow for a bit.  She is not taking anything for pain control, she does not want to rely on oral medication. Sometimes takes liquid ibuprofen. She will be starting physical therapy next month.  PERTINENT  PMH / PSH:   OBJECTIVE:   BP 124/82   Ht 5\' 6"  (1.676 m)   Wt 168 lb (76.2 kg)   BMI 27.12 kg/m    Gen: NAD, awake, alert Back: - Inspection: no gross deformity or asymmetry, swelling or  ecchymosis - Palpation: no TTP of lumbar spinous process, no tenderness to palpation of lumbar paraspinal musculature - ROM: full ROM of flexion, decreased extension with some pain - Strength: 5/5 with extension/flexion of leg, 5/5 great toe strength - Neuro: sensation intact in LE - Special testing: negative slump test Hip: No pain with internal/external rotation  Bilateral hamstring: - Inspection: No obvious abnormalities on inspection - Palpation: Non-tender to palpation along medial, lateral or proximal portions of hamstrings b/l no IT band tenderness - Strength: 5/5 at 90 degree knee flexion and 30 degree knee flexion b/l ; no pain that was elicited or reproduced with this.      ASSESSMENT/PLAN:   Pain in both thighs Continues to have bilateral thigh soreness/pain.  Possibly related to facet arthropathy lower lumbar spine on x-ray with some opacification of facets.  Also has pain/limitations with extension of back. Feels worst in morning and gets better after stretching similar to arthropathy pain. Discussed with patient the importance of physical therapy which she has at home for this coming month.  Patient has never tried meloxicam before-we discussed trying a few doses and picking this up.  We also discussed that we can obtain an MRI to confirm facet arthropathy-patient would like to get facet injections if this does show up on MRI. -Patient to pick up meloxicam for pain control -She may return to office next week for a Toradol IM injection  if continuing to have pain -MRI lumbar spine -patient would like results called in to her -Physical therapy   Gerrit Heck, MD [place]   Patient seen and evaluated with the resident.  I agree with the above plan of care.  I personally reviewed her lumbar spine x-rays and it does appear as though she has some facet arthropathy at L5-S1 bilaterally.  We will get an MRI in anticipation of ordering diagnostic/therapeutic  facet injections.  Phone follow-up with her with those results when available.  In the meantime, she will proceed with physical therapy as ordered.  I have also encouraged her to try the meloxicam that was prescribed by her PCP yesterday.  Unfortunately, our CMA is not in the office today but the patient may return to the office sometime next week when he returns for a repeat Toradol injection if she would like.  This note was dictated using Dragon naturally speaking software and may contain errors in syntax, spelling, or content which have not been identified prior to signing this note.

## 2022-05-27 NOTE — Assessment & Plan Note (Signed)
Continues to have bilateral thigh soreness/pain.  Possibly related to facet arthropathy lower lumbar spine on x-ray with some opacification of facets.  Also has pain/limitations with extension of back. Feels worst in morning and gets better after stretching similar to arthropathy pain. Discussed with patient the importance of physical therapy which she has at home for this coming month.  Patient has never tried meloxicam before-we discussed trying a few doses and picking this up.  We also discussed that we can obtain an MRI to confirm facet arthropathy-patient would like to get facet injections if this does show up on MRI. -Patient to pick up meloxicam for pain control -She may return to office next week for a Toradol IM injection if continuing to have pain -MRI lumbar spine -patient would like results called in to her -Physical therapy

## 2022-05-27 NOTE — Patient Instructions (Signed)
It was great to see you! Thank you for allowing me to participate in your care!   Our plans for today:  - Please pick up the meloxicam and try this in the meantime - You can come in next week for injection if still having pain - Please schedule your MRI - Please got the PT!  Take care and seek immediate care sooner if you develop any concerns.  Gerrit Heck, MD

## 2022-05-31 ENCOUNTER — Ambulatory Visit (INDEPENDENT_AMBULATORY_CARE_PROVIDER_SITE_OTHER): Payer: Medicaid Other | Admitting: Family Medicine

## 2022-05-31 DIAGNOSIS — M5416 Radiculopathy, lumbar region: Secondary | ICD-10-CM

## 2022-05-31 MED ORDER — KETOROLAC TROMETHAMINE 60 MG/2ML IM SOLN
60.0000 mg | Freq: Once | INTRAMUSCULAR | Status: AC
  Administered 2022-05-31: 60 mg via INTRAMUSCULAR

## 2022-05-31 NOTE — Progress Notes (Unsigned)
Pt here to Toradol injection. Pt tolerated injection will without reaction.

## 2022-06-01 ENCOUNTER — Ambulatory Visit
Admission: RE | Admit: 2022-06-01 | Discharge: 2022-06-01 | Disposition: A | Discharge status: Home / Self Care | Payer: Medicaid Other | Source: Ambulatory Visit | Attending: Family Medicine | Admitting: Family Medicine

## 2022-06-01 DIAGNOSIS — Z1231 Encounter for screening mammogram for malignant neoplasm of breast: Secondary | ICD-10-CM

## 2022-06-02 ENCOUNTER — Other Ambulatory Visit: Payer: Self-pay

## 2022-06-02 ENCOUNTER — Ambulatory Visit
Admission: RE | Admit: 2022-06-02 | Discharge: 2022-06-02 | Disposition: A | Discharge status: Home / Self Care | Payer: Medicaid Other | Source: Ambulatory Visit | Attending: Family Medicine | Admitting: Family Medicine

## 2022-06-02 ENCOUNTER — Ambulatory Visit: Payer: Medicaid Other | Attending: Family Medicine | Admitting: Physical Therapy

## 2022-06-02 ENCOUNTER — Ambulatory Visit
Admission: RE | Admit: 2022-06-02 | Discharge: 2022-06-02 | Disposition: A | Discharge status: Home / Self Care | Payer: Medicaid Other | Source: Ambulatory Visit | Attending: Sports Medicine | Admitting: Sports Medicine

## 2022-06-02 ENCOUNTER — Encounter: Payer: Self-pay | Admitting: Physical Therapy

## 2022-06-02 DIAGNOSIS — R2689 Other abnormalities of gait and mobility: Secondary | ICD-10-CM

## 2022-06-02 DIAGNOSIS — M5416 Radiculopathy, lumbar region: Secondary | ICD-10-CM | POA: Insufficient documentation

## 2022-06-02 DIAGNOSIS — M79641 Pain in right hand: Secondary | ICD-10-CM

## 2022-06-02 DIAGNOSIS — M6281 Muscle weakness (generalized): Secondary | ICD-10-CM | POA: Diagnosis present

## 2022-06-02 NOTE — Therapy (Signed)
OUTPATIENT PHYSICAL THERAPY THORACOLUMBAR EVALUATION   Patient Name: Tonya Mckenzie MRN: [id number] DOB:1960/12/18, 62 y.o., female Today's Date: 06/02/2022  END OF SESSION:  PT End of Session - 06/02/22 1333     Visit Number 1    Number of Visits 4    Date for PT Re-Evaluation 07/14/22    Authorization Type Medicaid [place]    Authorization Time Period auth pending; POC request 1-2x/wk for 6 weeks until 07/14/22    Authorization - Visit Number 0    Authorization - Number of Visits 3    PT Start Time [id number]    PT Stop Time 1415    PT Time Calculation (min) 42 min    Activity Tolerance Patient tolerated treatment well             Past Medical History:  Diagnosis Date   Allergic rhinitis    Anxiety    Bacterial vaginosis    Chronic post-traumatic stress disorder (PTSD)    Cocaine abuse    Depression    ETOH abuse    GERD (gastroesophageal reflux disease)    Hyperlipidemia    Hypertension    off meds due to weight loss   Marijuana abuse    Uterine leiomyoma    Past Surgical History:  Procedure Laterality Date   ABDOMINAL HYSTERECTOMY     left ovaries, took cervix.  Performed for heavy periods and anemia   BICEPT TENODESIS Left 03/06/2020   Procedure: BICEPS TENODESIS;  Surgeon: Hiram Gash, MD;  Location: [place];  Service: Orthopedics;  Laterality: Left;   CHOLECYSTECTOMY     SHOULDER ARTHROSCOPY WITH ROTATOR CUFF REPAIR AND SUBACROMIAL DECOMPRESSION Left 03/06/2020   Procedure: SHOULDER ARTHROSCOPY WITH ROTATOR CUFF REPAIR AND SUBACROMIAL DECOMPRESSION PARTIAL ACROMIOPLASTY AND DISTAL CLAVICULECTOMY;  Surgeon: Hiram Gash, MD;  Location: [place];  Service: Orthopedics;  Laterality: Left;   thumb surgery     right   TOE SURGERY Left    TONSILLECTOMY     TYMPANOSTOMY TUBE PLACEMENT     Patient Active Problem List   Diagnosis Date Noted   Pain in both thighs 05/26/2022   Pain in both hands 05/26/2022   Elevated LFTs 03/09/2022    Lumbar radiculopathy 04/15/2021   Elevated liver enzymes 08/21/2020   Eustachian tube disorder, bilateral 05/28/2020   Other injury of muscle(s) and tendon(s) of the rotator cuff of left shoulder, subsequent encounter 05/28/2020   Varicose veins of bilateral lower extremities with pain 10/18/2019   Vitamin D deficiency 06/19/2019   Hyperlipidemia 03/13/2019   Essential hypertension 03/13/2019   PTSD (post-traumatic stress disorder) 03/13/2019   History of substance abuse 03/13/2019   Overweight (BMI 25.0-29.9) 03/13/2019   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 When exercising and massaging it goes away. Pt had a shot for pain. Mornings when she gets up she gets a pulling sensation (feels like when she's had a good work out and soreness). Does not come all the time. Some days are better than others. Will feel it 4-5 times during the week.   PERTINENT HISTORY:  L foot surgery  PAIN:  Are you having pain? Yes: NPRS scale: 0 currently, 8 at worst/10 Pain location: Both legs Pain description: Pulling sensation Aggravating factors: Sitting for long periods without moving, sleeping Relieving factors: Stretching, massaging, walking  PRECAUTIONS: None  WEIGHT BEARING RESTRICTIONS:  No  FALLS:  Has patient fallen in last 6 months? No  LIVING ENVIRONMENT: Lives with: lives alone Lives in: House/apartment Stairs: No Has following equipment at home: None  OCCUPATION: Ship broker; likes to work out at [place]: Independent  PATIENT GOALS: Improve pain  NEXT MD VISIT: not seen on chart  OBJECTIVE:   DIAGNOSTIC FINDINGS:  MRI scheduled  PATIENT SURVEYS:  Did not assess -- pt is fully functional and reports no limitations to her activities  SCREENING FOR RED FLAGS: Bowel or bladder incontinence: No Spinal tumors: No Cauda equina syndrome: No Compression fracture: No Abdominal aneurysm: No  COGNITION: Overall cognitive status: Within functional limits for tasks assessed     SENSATION: WFL  MUSCLE LENGTH: Hamstrings: Right 90 deg; Left 90 deg Thomas test: Right 10 deg; Left 10 deg  POSTURE: increased lumbar lordosis R lateral shift, decreased pelvic rotation, anterior pelvic tilt  PALPATION: No over tenderness to palpation  LUMBAR ROM:   AROM eval  Flexion 100%  Extension 25% radiates pain down bilat hips  Right lateral flexion 100% returning to neutral will bring on symptoms  Left lateral flexion 100%  Right rotation 100% returning to neutral will bring on symptoms  Left rotation 100%   (Blank rows = not tested)  LOWER EXTREMITY ROM:   WFL  LOWER EXTREMITY MMT:    MMT Right eval Left eval  Hip flexion 3+ 4-  Hip extension 3 3  Hip abduction 3+ 3+  Hip adduction    Hip internal rotation 5 5  Hip external rotation 4 4+  Knee flexion 5 5  Knee extension 5 5  Ankle dorsiflexion    Ankle plantarflexion    Ankle inversion    Ankle eversion     (Blank rows = not tested)  LUMBAR SPECIAL TESTS:  Straight leg raise test: Negative, Slump test: Positive, Stork standing: Positive, and FABER test: Negative   FUNCTIONAL TESTS:  SLS: R 8.54 sec, L 4 sec Plank forearm/feet 9.94 sec Unable to perform side plank  GAIT:  WFL  TODAY'S TREATMENT:  DATE: 06/02/22  See HEP   PATIENT EDUCATION:  Education details: Exam findings, POC, initial HEP Person educated: Patient Education method: Explanation, Demonstration, and Handouts Education comprehension: verbalized understanding, returned demonstration, and needs further education  HOME EXERCISE PROGRAM: Access Code: [email or web address] URL: [email or web address] Date: 06/02/2022 Prepared by: Estill Bamberg April Thurnell Garbe  Exercises - Plank on Knees  - 1 x daily - 7 x weekly - 2 sets - 30 sec hold - Clamshell with Resistance  - 1 x daily - 7 x weekly - 3 sets - 10 reps - Supine Bridge  - 1 x daily - 7 x weekly - 3 sets - 10 reps - 3 sec hold - Supine March with Resistance Band  - 1 x daily - 7 x weekly - 3 sets - 10 reps  ASSESSMENT:  CLINICAL IMPRESSION: Patient is a 62 y.o. F who was seen today for physical therapy evaluation and treatment for posterior bilat LE pain. Pt notes radiating bilat posterior leg pain from her hips to just below her knee. Pain has been ongoing x 1 year but goes away with activity and massage. Pt is highly active at baseline but demos decreased bilat hip and core weakness resulting in LE instability/decreased balance. Posture significant for increased lumbar lordosis with lateral shift. Pt reports no limitations in her activities but pain/discomfort with prolonged positions such as increased time sitting, sleeping, and lumbar extension. Pt would benefit from PT to address her pain/discomfort.   OBJECTIVE IMPAIRMENTS: decreased activity tolerance, decreased balance, decreased endurance, decreased ROM, decreased strength, increased muscle spasms, impaired sensation, improper body mechanics, postural dysfunction, and pain.   ACTIVITY LIMITATIONS: sitting, sleeping, and transfers  PARTICIPATION LIMITATIONS:  interpersonal relationship and community activity  PERSONAL FACTORS: Fitness, Past/current experiences, and Time since onset of injury/illness/exacerbation are also affecting patient's functional outcome.   REHAB POTENTIAL: Good  CLINICAL DECISION MAKING: Stable/uncomplicated  EVALUATION COMPLEXITY: Low   GOALS: Goals reviewed with patient? Yes  SHORT TERM GOALS: Target date: 06/23/2022   Pt will be ind with initial HEP Baseline: Goal status: INITIAL  2.  Pt will report improved pain by >/=50% Baseline:  Goal status: INITIAL    LONG TERM GOALS: Target date: 07/14/2022   Pt will be ind with management and progression of HEP Baseline:  Goal status: INITIAL  2.  Pt will be able to maintain plank at least 20 sec to demo improved core strength for age norms Baseline:  Goal status: INITIAL  3.  Pt will be able to perform SLS x 10 sec to demo increased LE stability Baseline:  Goal status: INITIAL  4.  Pt will report resolution of symptoms x 2 weeks Baseline:  Goal status: INITIAL   PLAN:  PT FREQUENCY: 1-2x/week  PT DURATION: 6 weeks  PLANNED INTERVENTIONS: Therapeutic exercises, Therapeutic activity, Neuromuscular re-education, Balance training, Gait training, Patient/Family education, Self Care, Joint mobilization, Stair training, Aquatic Therapy, Dry Needling, Electrical stimulation, Spinal mobilization, Cryotherapy, Moist heat, Taping, Ionotophoresis 4mg /ml Dexamethasone, Manual therapy, and Re-evaluation.  PLAN FOR NEXT SESSION: Assess response to HEP. Continue core and hip strengthening. Consider adding neural glides.    Jaymz Traywick April Ma L Tawanda Schall, PT 06/02/2022, 2:36 PM

## 2022-06-07 ENCOUNTER — Telehealth: Payer: Self-pay | Admitting: Family Medicine

## 2022-06-07 NOTE — Telephone Encounter (Signed)
Patient was given a print out of lab results. Patient is requesting a call from the doctor to discuss lab and mammogram results.  Ph # [phone]

## 2022-06-07 NOTE — Telephone Encounter (Signed)
Patient called back about her results.  She was informed about her mammogram results.  I also told her that I would send the message to Dr. Jena Gauss so they can discuss her labs.  Patient voiced understanding.  Gordana Kewley,CMA

## 2022-06-07 NOTE — Telephone Encounter (Signed)
Patient LVM on nurse line in regards to lab results.   Lab results discussed with patient. Patient advised we will do repeat lab work on 5/6 and to make sure she is staying well hydrated.   All questions answered.

## 2022-06-08 ENCOUNTER — Other Ambulatory Visit: Payer: Self-pay | Admitting: Student

## 2022-06-08 DIAGNOSIS — M79641 Pain in right hand: Secondary | ICD-10-CM

## 2022-06-09 ENCOUNTER — Other Ambulatory Visit: Payer: Self-pay | Admitting: Nurse Practitioner

## 2022-06-10 ENCOUNTER — Telehealth: Payer: Self-pay | Admitting: Sports Medicine

## 2022-06-10 NOTE — Telephone Encounter (Signed)
  I spoke with the patient on the phone today after reviewing MRI findings of her lumbar spine.  Dominant finding is bilateral facet arthropathy at L3-L4 and L4-L5.  Both levels have facet effusions.  Fortunately, her symptoms have improved quite a bit with physical therapy.  Although we had previously discussed proceeding with facet injections, we will hold on that for now.  Of note, she did return to the office for an IM Toradol injection after her office visit with me which did not provide her with any pain relief.

## 2022-06-14 ENCOUNTER — Encounter: Payer: Self-pay | Admitting: Student

## 2022-06-16 ENCOUNTER — Ambulatory Visit: Payer: Medicaid Other | Admitting: Physical Therapy

## 2022-06-16 ENCOUNTER — Encounter: Payer: Self-pay | Admitting: Physical Therapy

## 2022-06-16 DIAGNOSIS — R2689 Other abnormalities of gait and mobility: Secondary | ICD-10-CM

## 2022-06-16 DIAGNOSIS — M6281 Muscle weakness (generalized): Secondary | ICD-10-CM

## 2022-06-16 NOTE — Therapy (Signed)
OUTPATIENT PHYSICAL THERAPY TREATMENT NOTE   Patient Name: Tonya Mckenzie MRN: [id number] DOB:09-04-1960, 62 y.o., female Today's Date: 06/16/2022  PCP: Reece Leader   REFERRING PROVIDER: Ralene Cork, DO   PT End of Session - 06/16/22 1522     Visit Number 2    Number of Visits 4    Date for PT Re-Evaluation 07/14/22    Authorization Type Medicaid [place]    Authorization Time Period auth pending; POC request 1-2x/wk for 6 weeks until 07/14/22    Authorization - Visit Number 1    Authorization - Number of Visits 3    PT Start Time 0325    PT Stop Time 0405    PT Time Calculation (min) 40 min    Activity Tolerance Patient tolerated treatment well             Past Medical History:  Diagnosis Date   Allergic rhinitis    Anxiety    Bacterial vaginosis    Chronic post-traumatic stress disorder (PTSD)    Cocaine abuse    Depression    ETOH abuse    GERD (gastroesophageal reflux disease)    Hyperlipidemia    Hypertension    off meds due to weight loss   Marijuana abuse    Uterine leiomyoma    Past Surgical History:  Procedure Laterality Date   ABDOMINAL HYSTERECTOMY     left ovaries, took cervix.  Performed for heavy periods and anemia   BICEPT TENODESIS Left 03/06/2020   Procedure: BICEPS TENODESIS;  Surgeon: Bjorn Pippin, MD;  Location: [facility];  Service: Orthopedics;  Laterality: Left;   CHOLECYSTECTOMY     SHOULDER ARTHROSCOPY WITH ROTATOR CUFF REPAIR AND SUBACROMIAL DECOMPRESSION Left 03/06/2020   Procedure: SHOULDER ARTHROSCOPY WITH ROTATOR CUFF REPAIR AND SUBACROMIAL DECOMPRESSION PARTIAL ACROMIOPLASTY AND DISTAL CLAVICULECTOMY;  Surgeon: Bjorn Pippin, MD;  Location:  SURGERY CENTER;  Service: Orthopedics;  Laterality: Left;   thumb surgery     right   TOE SURGERY Left    TONSILLECTOMY     TYMPANOSTOMY TUBE PLACEMENT     Patient Active Problem List   Diagnosis Date Noted   Pain in both thighs 05/26/2022   Pain in both hands  05/26/2022   Elevated LFTs 03/09/2022   Lumbar radiculopathy 04/15/2021   Elevated liver enzymes 08/21/2020   Eustachian tube disorder, bilateral 05/28/2020   Other injury of muscle(s) and tendon(s) of the rotator cuff of left shoulder, subsequent encounter 05/28/2020   Varicose veins of bilateral lower extremities with pain 10/18/2019   Vitamin D deficiency 06/19/2019   Hyperlipidemia 03/13/2019   Essential hypertension 03/13/2019   PTSD (post-traumatic stress disorder) 03/13/2019   History of substance abuse 03/13/2019   Overweight (BMI 25.0-29.9) 03/13/2019   Cocaine abuse with cocaine-induced psychotic disorder 07/09/2017   MDD (major depressive disorder), recurrent severe, without psychosis 07/09/2017    THERAPY DIAG:  Muscle weakness (generalized)  Other abnormalities of gait and mobility   Rationale for Evaluation and Treatment: Rehabilitation  REFERRING DIAG: M54.16 (ICD-10-CM) - Lumbar radiculopathy   PERTINENT HISTORY: L foot surgery   PRECAUTIONS/RESTRICTIONS:   None   SUBJECTIVE:  Pt reports that she has been doing her exercises and feels they are helpful.  PAIN:  Are you having pain? Yes: NPRS scale: 0 currently, 8 at worst/10 Pain location: Both legs Pain description: Pulling sensation Aggravating factors: Sitting for long periods without moving, sleeping Relieving factors: Stretching, massaging, walking  OBJECTIVE: (objective measures completed at initial  evaluation unless otherwise dated)  DIAGNOSTIC FINDINGS:  MRI scheduled   PATIENT SURVEYS:  Did not assess -- pt is fully functional and reports no limitations to her activities   SCREENING FOR RED FLAGS: Bowel or bladder incontinence: No Spinal tumors: No Cauda equina syndrome: No Compression fracture: No Abdominal aneurysm: No   COGNITION: Overall cognitive status: Within functional limits for tasks assessed                          SENSATION: WFL   MUSCLE LENGTH: Hamstrings: Right 90  deg; Left 90 deg Thomas test: Right 10 deg; Left 10 deg   POSTURE: increased lumbar lordosis R lateral shift, decreased pelvic rotation, anterior pelvic tilt   PALPATION: No over tenderness to palpation   LUMBAR ROM:    AROM eval  Flexion 100%  Extension 25% radiates pain down bilat hips  Right lateral flexion 100% returning to neutral will bring on symptoms  Left lateral flexion 100%  Right rotation 100% returning to neutral will bring on symptoms  Left rotation 100%   (Blank rows = not tested)   LOWER EXTREMITY ROM:   WFL   LOWER EXTREMITY MMT:     MMT Right eval Left eval  Hip flexion 3+ 4-  Hip extension 3 3  Hip abduction 3+ 3+  Hip adduction      Hip internal rotation 5 5  Hip external rotation 4 4+  Knee flexion 5 5  Knee extension 5 5  Ankle dorsiflexion      Ankle plantarflexion      Ankle inversion      Ankle eversion       (Blank rows = not tested)   LUMBAR SPECIAL TESTS:  Straight leg raise test: Negative, Slump test: Positive, Stork standing: Positive, and FABER test: Negative     FUNCTIONAL TESTS:  SLS: R 8.54 sec, L 4 sec Plank forearm/feet 9.94 sec Unable to perform side plank   GAIT: WFL   TODAY'S TREATMENT:                                                                                                                              DATE: 06/02/22  See HEP     PATIENT EDUCATION:  Education details: Exam findings, POC, initial HEP Person educated: Patient Education method: Explanation, Demonstration, and Handouts Education comprehension: verbalized understanding, returned demonstration, and needs further education   HOME EXERCISE PROGRAM: Access Code: [email or web address] URL: [email or web address] Date: 06/16/2022 Prepared by: Alphonzo Severance  Exercises - Plank on Knees  - 1 x daily - 7 x weekly - 2 sets - 30 sec hold - Clamshell with Resistance  - 1 x daily - 7 x weekly - 3 sets - 10 reps - Staggered Bridge  - 1 x daily - 7 x  weekly - 3 sets - 10 reps - Side Plank on Knees  - 1 x daily - 7 x  weekly - 3 sets - 10 reps - Bird Dog  - 1 x daily - 7 x weekly - 1 sets - 10 reps - 10 second hold    TREATMENT 4/17:  Therapeutic Exercise: - nu-step L6 62m while taking subjective and planning session with patient - LTR - alternating clamshell - black TB - 2x10 - sidelying clam - black TB - 2x10 - side plank from knees - 2x10 - Bridge - 10x - 3'' - staggered bridge - 2x10 ea - side plank from knees w/ UE support - 10x ea - dead bug - unable - bird dog - 2x10 - child pose - knee ext machine - 10# - 3x10 - HS curl machine 25# - 3x10  Consider more direct hip abd strengthening  ASSESSMENT:   CLINICAL IMPRESSION: Satoria tolerated session well with no adverse reaction.  Progressing as expected with core and hip strengthening.  Increased intensity today.  HEP updated.     OBJECTIVE IMPAIRMENTS: decreased activity tolerance, decreased balance, decreased endurance, decreased ROM, decreased strength, increased muscle spasms, impaired sensation, improper body mechanics, postural dysfunction, and pain.    ACTIVITY LIMITATIONS: sitting, sleeping, and transfers   PARTICIPATION LIMITATIONS: interpersonal relationship and community activity   PERSONAL FACTORS: Fitness, Past/current experiences, and Time since onset of injury/illness/exacerbation are also affecting patient's functional outcome.    REHAB POTENTIAL: Good   CLINICAL DECISION MAKING: Stable/uncomplicated   EVALUATION COMPLEXITY: Low     GOALS: Goals reviewed with patient? Yes   SHORT TERM GOALS: Target date: 06/23/2022     Pt will be ind with initial HEP Baseline: Goal status: INITIAL   2.  Pt will report improved pain by >/=50% Baseline:  Goal status: INITIAL       LONG TERM GOALS: Target date: 07/14/2022     Pt will be ind with management and progression of HEP Baseline:  Goal status: INITIAL   2.  Pt will be able to maintain plank at  least 20 sec to demo improved core strength for age norms Baseline:  Goal status: INITIAL   3.  Pt will be able to perform SLS x 10 sec to demo increased LE stability Baseline:  Goal status: INITIAL   4.  Pt will report resolution of symptoms x 2 weeks Baseline:  Goal status: INITIAL     PLAN:   PT FREQUENCY: 1-2x/week   PT DURATION: 6 weeks   PLANNED INTERVENTIONS: Therapeutic exercises, Therapeutic activity, Neuromuscular re-education, Balance training, Gait training, Patient/Family education, Self Care, Joint mobilization, Stair training, Aquatic Therapy, Dry Needling, Electrical stimulation, Spinal mobilization, Cryotherapy, Moist heat, Taping, Ionotophoresis /ml Dexamethasone, Manual therapy, and Re-evaluation.   PLAN FOR NEXT SESSION: Assess response to HEP. Continue core and hip strengthening. Consider adding neural glides.    Kimberlee Nearing Susanna Benge PT 06/16/2022, 4:07 PM

## 2022-06-22 ENCOUNTER — Encounter: Payer: Self-pay | Admitting: Physical Therapy

## 2022-06-22 ENCOUNTER — Ambulatory Visit: Payer: Medicaid Other | Admitting: Physical Therapy

## 2022-06-22 DIAGNOSIS — M6281 Muscle weakness (generalized): Secondary | ICD-10-CM

## 2022-06-22 DIAGNOSIS — R2689 Other abnormalities of gait and mobility: Secondary | ICD-10-CM

## 2022-06-22 NOTE — Therapy (Signed)
OUTPATIENT PHYSICAL THERAPY TREATMENT NOTE   Patient Name: Tonya Mckenzie MRN: [id number] DOB:03/30/1960, 62 y.o., female Today's Date: 06/22/2022  PCP: Reece Leader   REFERRING PROVIDER: Ralene Cork, DO   PT End of Session - 06/22/22 1346     Visit Number 3    Number of Visits 4    Date for PT Re-Evaluation 07/14/22    Authorization Type Medicaid [place]    Authorization Time Period auth pending; POC request 1-2x/wk for 6 weeks until 07/14/22    Authorization - Visit Number 2    Authorization - Number of Visits 3    PT Start Time 0145    PT Stop Time 0226    PT Time Calculation (min) 41 min    Activity Tolerance Patient tolerated treatment well             Past Medical History:  Diagnosis Date   Allergic rhinitis    Anxiety    Bacterial vaginosis    Chronic post-traumatic stress disorder (PTSD)    Cocaine abuse    Depression    ETOH abuse    GERD (gastroesophageal reflux disease)    Hyperlipidemia    Hypertension    off meds due to weight loss   Marijuana abuse    Uterine leiomyoma    Past Surgical History:  Procedure Laterality Date   ABDOMINAL HYSTERECTOMY     left ovaries, took cervix.  Performed for heavy periods and anemia   BICEPT TENODESIS Left 03/06/2020   Procedure: BICEPS TENODESIS;  Surgeon: Bjorn Pippin, MD;  Location: [facility];  Service: Orthopedics;  Laterality: Left;   CHOLECYSTECTOMY     SHOULDER ARTHROSCOPY WITH ROTATOR CUFF REPAIR AND SUBACROMIAL DECOMPRESSION Left 03/06/2020   Procedure: SHOULDER ARTHROSCOPY WITH ROTATOR CUFF REPAIR AND SUBACROMIAL DECOMPRESSION PARTIAL ACROMIOPLASTY AND DISTAL CLAVICULECTOMY;  Surgeon: Bjorn Pippin, MD;  Location: [facility];  Service: Orthopedics;  Laterality: Left;   thumb surgery     right   TOE SURGERY Left    TONSILLECTOMY     TYMPANOSTOMY TUBE PLACEMENT     Patient Active Problem List   Diagnosis Date Noted   Pain in both thighs 05/26/2022   Pain in both hands  05/26/2022   Elevated LFTs 03/09/2022   Lumbar radiculopathy 04/15/2021   Elevated liver enzymes 08/21/2020   Eustachian tube disorder, bilateral 05/28/2020   Other injury of muscle(s) and tendon(s) of the rotator cuff of left shoulder, subsequent encounter 05/28/2020   Varicose veins of bilateral lower extremities with pain 10/18/2019   Vitamin D deficiency 06/19/2019   Hyperlipidemia 03/13/2019   Essential hypertension 03/13/2019   PTSD (post-traumatic stress disorder) 03/13/2019   History of substance abuse 03/13/2019   Overweight (BMI 25.0-29.9) 03/13/2019   Cocaine abuse with cocaine-induced psychotic disorder 07/09/2017   MDD (major depressive disorder), recurrent severe, without psychosis 07/09/2017    THERAPY DIAG:  Muscle weakness (generalized)  Other abnormalities of gait and mobility   Rationale for Evaluation and Treatment: Rehabilitation  REFERRING DIAG: M54.16 (ICD-10-CM) - Lumbar radiculopathy   PERTINENT HISTORY: L foot surgery   PRECAUTIONS/RESTRICTIONS:   None   SUBJECTIVE:  Pt reports that she continues to do well.  She has 0/10 pain currently and a max of 5/10 pain over the last few days.  When she exercises this feels better.  PAIN:  Are you having pain? Yes: NPRS scale: 0 currently, 8 at worst/10 Pain location: Both legs Pain description: Pulling sensation Aggravating factors: Sitting  for long periods without moving, sleeping Relieving factors: Stretching, massaging, walking  OBJECTIVE: (objective measures completed at initial evaluation unless otherwise dated)  DIAGNOSTIC FINDINGS:  MRI scheduled   PATIENT SURVEYS:  Did not assess -- pt is fully functional and reports no limitations to her activities   SCREENING FOR RED FLAGS: Bowel or bladder incontinence: No Spinal tumors: No Cauda equina syndrome: No Compression fracture: No Abdominal aneurysm: No   COGNITION: Overall cognitive status: Within functional limits for tasks assessed                           SENSATION: WFL   MUSCLE LENGTH: Hamstrings: Right 90 deg; Left 90 deg Thomas test: Right 10 deg; Left 10 deg   POSTURE: increased lumbar lordosis R lateral shift, decreased pelvic rotation, anterior pelvic tilt   PALPATION: No over tenderness to palpation   LUMBAR ROM:    AROM eval  Flexion 100%  Extension 25% radiates pain down bilat hips  Right lateral flexion 100% returning to neutral will bring on symptoms  Left lateral flexion 100%  Right rotation 100% returning to neutral will bring on symptoms  Left rotation 100%   (Blank rows = not tested)   LOWER EXTREMITY ROM:   WFL   LOWER EXTREMITY MMT:     MMT Right eval Left eval  Hip flexion 3+ 4-  Hip extension 3 3  Hip abduction 3+ 3+  Hip adduction      Hip internal rotation 5 5  Hip external rotation 4 4+  Knee flexion 5 5  Knee extension 5 5  Ankle dorsiflexion      Ankle plantarflexion      Ankle inversion      Ankle eversion       (Blank rows = not tested)   LUMBAR SPECIAL TESTS:  Straight leg raise test: Negative, Slump test: Positive, Stork standing: Positive, and FABER test: Negative     FUNCTIONAL TESTS:  SLS: R 8.54 sec, L 4 sec Plank forearm/feet 9.94 sec Unable to perform side plank   GAIT: WFL   PATIENT EDUCATION:  Education details: Exam findings, POC, initial HEP Person educated: Patient Education method: Explanation, Demonstration, and Handouts Education comprehension: verbalized understanding, returned demonstration, and needs further education   HOME EXERCISE PROGRAM: Access Code: [email or web address] URL: [email or web address] Date: 06/16/2022 Prepared by: Alphonzo Severance  Exercises - Plank on Knees  - 1 x daily - 7 x weekly - 2 sets - 30 sec hold - Clamshell with Resistance  - 1 x daily - 7 x weekly - 3 sets - 10 reps - Staggered Bridge  - 1 x daily - 7 x weekly - 3 sets - 10 reps - Side Plank on Knees  - 1 x daily - 7 x weekly - 3 sets - 10 reps -  Bird Dog  - 1 x daily - 7 x weekly - 1 sets - 10 reps - 10 second hold    TREATMENT:  Therapeutic Exercise: - nu-step L6 23m while taking subjective and planning session with patient - LTR - SKTC  - DKTC - sidelying clam - black TB - 2x10 - side plank from knees with clam - 2x10 - Bridge - 10x - 3'' - Bridge with clam - Black TB - 3x10 - side plank from knees w/ UE support - 2x10 ea - 90-90 heel taps - bird dog - 2x6 5'' holds - child pose - paloff press - 10x -  10#  Consider more direct hip abd strengthening  ASSESSMENT:   CLINICAL IMPRESSION: Bertha tolerated session well with no adverse reaction.  Pt continues to show significant progress with lower pain levels and improved exercises tolerance.  She shows improved coordination with bird dog today.  Added in paloff press for standing core strength and will continue to progress standing core strength as able.   OBJECTIVE IMPAIRMENTS: decreased activity tolerance, decreased balance, decreased endurance, decreased ROM, decreased strength, increased muscle spasms, impaired sensation, improper body mechanics, postural dysfunction, and pain.    ACTIVITY LIMITATIONS: sitting, sleeping, and transfers   PARTICIPATION LIMITATIONS: interpersonal relationship and community activity   PERSONAL FACTORS: Fitness, Past/current experiences, and Time since onset of injury/illness/exacerbation are also affecting patient's functional outcome.    REHAB POTENTIAL: Good   CLINICAL DECISION MAKING: Stable/uncomplicated   EVALUATION COMPLEXITY: Low     GOALS: Goals reviewed with patient? Yes   SHORT TERM GOALS: Target date: 06/23/2022     Pt will be ind with initial HEP Baseline: Goal status: MET   2.  Pt will report improved pain by >/=50% Baseline:  Goal status: MET       LONG TERM GOALS: Target date: 07/14/2022     Pt will be ind with management and progression of HEP Baseline:  Goal status: INITIAL   2.  Pt will be able to  maintain plank at least 20 sec to demo improved core strength for age norms Baseline:  Goal status: INITIAL   3.  Pt will be able to perform SLS x 10 sec to demo increased LE stability Baseline:  Goal status: INITIAL   4.  Pt will report resolution of symptoms x 2 weeks Baseline:  Goal status: INITIAL     PLAN:   PT FREQUENCY: 1-2x/week   PT DURATION: 6 weeks   PLANNED INTERVENTIONS: Therapeutic exercises, Therapeutic activity, Neuromuscular re-education, Balance training, Gait training, Patient/Family education, Self Care, Joint mobilization, Stair training, Aquatic Therapy, Dry Needling, Electrical stimulation, Spinal mobilization, Cryotherapy, Moist heat, Taping, Ionotophoresis /ml Dexamethasone, Manual therapy, and Re-evaluation.   PLAN FOR NEXT SESSION: Assess response to HEP. Continue core and hip strengthening. Consider adding neural glides.    Kimberlee Nearing Zoraida Havrilla PT 06/22/2022, 2:32 PM

## 2022-06-24 ENCOUNTER — Ambulatory Visit: Payer: Medicaid Other

## 2022-06-29 ENCOUNTER — Ambulatory Visit: Payer: Medicaid Other | Admitting: Physical Therapy

## 2022-07-01 ENCOUNTER — Ambulatory Visit: Payer: Medicaid Other | Attending: Family Medicine

## 2022-07-05 ENCOUNTER — Ambulatory Visit: Payer: Medicaid Other | Admitting: Family Medicine

## 2022-07-05 ENCOUNTER — Other Ambulatory Visit: Payer: Self-pay | Admitting: *Deleted

## 2022-07-05 DIAGNOSIS — M7989 Other specified soft tissue disorders: Secondary | ICD-10-CM

## 2022-07-06 ENCOUNTER — Telehealth: Payer: Self-pay

## 2022-07-06 ENCOUNTER — Ambulatory Visit: Payer: Medicaid Other

## 2022-07-06 NOTE — Telephone Encounter (Signed)
PT called patient and left a voicemail regarding missed visit. Reminder of attendance policy and next visit. Will discharge if she misses next session.

## 2022-07-08 ENCOUNTER — Ambulatory Visit: Payer: Medicaid Other

## 2022-07-09 ENCOUNTER — Ambulatory Visit: Payer: Medicaid Other | Admitting: Family Medicine

## 2022-07-12 ENCOUNTER — Ambulatory Visit (HOSPITAL_COMMUNITY): Payer: Medicaid Other | Attending: Surgery

## 2022-07-12 ENCOUNTER — Telehealth: Payer: Self-pay

## 2022-07-12 ENCOUNTER — Encounter: Payer: Medicaid Other | Admitting: Surgery

## 2022-07-12 ENCOUNTER — Other Ambulatory Visit: Payer: Self-pay | Admitting: Sports Medicine

## 2022-07-12 DIAGNOSIS — M5416 Radiculopathy, lumbar region: Secondary | ICD-10-CM

## 2022-07-12 NOTE — Telephone Encounter (Signed)
Order for lumbar facet injection placed.

## 2022-07-13 ENCOUNTER — Encounter: Payer: Self-pay | Admitting: Family Medicine

## 2022-07-13 ENCOUNTER — Ambulatory Visit: Payer: Medicaid Other | Admitting: Physical Therapy

## 2022-07-13 ENCOUNTER — Other Ambulatory Visit: Payer: Self-pay | Admitting: Student

## 2022-07-13 ENCOUNTER — Ambulatory Visit (INDEPENDENT_AMBULATORY_CARE_PROVIDER_SITE_OTHER): Payer: Medicaid Other | Admitting: Family Medicine

## 2022-07-13 VITALS — BP 127/84 | HR 63 | Ht 66.0 in | Wt 171.5 lb

## 2022-07-13 DIAGNOSIS — M5416 Radiculopathy, lumbar region: Secondary | ICD-10-CM

## 2022-07-13 DIAGNOSIS — M79651 Pain in right thigh: Secondary | ICD-10-CM

## 2022-07-13 DIAGNOSIS — M79641 Pain in right hand: Secondary | ICD-10-CM

## 2022-07-13 MED ORDER — IBUPROFEN 800 MG PO TABS
800.0000 mg | ORAL_TABLET | Freq: Every day | ORAL | 0 refills | Status: DC
Start: 2022-07-13 — End: 2022-08-04

## 2022-07-13 NOTE — Progress Notes (Signed)
    SUBJECTIVE:   CHIEF COMPLAINT / HPI:   Patient with history of lumbar radiculopathy for worsening back pain that restarted about 6 months ago. Some days are better than others where she won't even feel any pain. It does not bother her when she is up and moving about. It most hurts in the morning when she first gets up. Stretching and massaging helps her a significant amount. Doing PT, rehab is helping. Has had hip injections in the past but never in the back. Has not worked out at [employer] since she did not want to cause more harm. Denies no new trauma or injury. Seen by [facility]  last month, started on meloxicam which did not help so she was given a toradol injection which also did not seem to help. Ibuprofen works the best, an 800 mg tablet and she only requires 1 tablet to help with her pain. Denies any numbness or tingling. Denies fever, chills, awakening at night with pain, groin paresthesia or personal history of malignancy. Referral placed to orthopedics, awaiting call from them to schedule an appointment.   OBJECTIVE:   BP 127/84   Pulse 63   Ht 5\' 6"  (1.676 m)   Wt 171 lb 8 oz (77.8 kg)   SpO2 96%   BMI 27.68 kg/m   General: Patient in no acute distress. Resp: normal work of breathing noted MSK: full active ROM with flexion, extension and sidebending, no point tenderness noted along paraspinals, normal spinal sweep Neuro: 5/5 LE strength bilaterally, gross sensation intact, 2+ patellar and achilles reflexes bilaterally   ASSESSMENT/PLAN:   Lumbar radiculopathy -acute on chronic, recent MRI lumbar at the beginning of April demonstrated for L3-L4 disc bulge with moderate facet arthropathy and bilateral facet effusions. Also notable for mild to moderate spinal stenosis without acute osseous injury of the lumbar spine. No red flag symptoms to indicate repeat imaging.  -seen by sports medicine multiple times and has been referral to orthopedics.  -participating in formal PT as  well as stretching exercise rehab at home, encouraged to continue this as this improves her quality of life -prescribed short course of ibuprofen 800 mg daily until we await ortho response, recent renal function appropriate and discussed risks  -instructed patient to call our clinic if she does not hear from the orthopedics office within 2 weeks so I can place another referral -follow up pending symptoms and ortho evaluation   -PHQ-9 score of 0 reviewed.    Reece Leader, DO [facility]

## 2022-07-13 NOTE — Patient Instructions (Signed)
It was great seeing you today!  Today we discussed your back pain, I am so sorry that this has been an ongoing issue. Please continue the stretching exercises along with formal physical therapy. I have prescribed ibuprofen 800 mg daily, please make sure to take this on a full stomach.   Hopefully orthopedics will be in touch soon.   Please follow up at your next scheduled appointment, if anything arises between now and then, please don't hesitate to contact our office.   Thank you for allowing [place] to be a part of your medical care!  Thank you, Dr. Robyne Peers

## 2022-07-13 NOTE — Assessment & Plan Note (Signed)
-  acute on chronic, recent MRI lumbar at the beginning of April demonstrated for L3-L4 disc bulge with moderate facet arthropathy and bilateral facet effusions. Also notable for mild to moderate spinal stenosis without acute osseous injury of the lumbar spine. No red flag symptoms to indicate repeat imaging.  -seen by sports medicine multiple times and has been referral to orthopedics.  -participating in formal PT as well as stretching exercise rehab at home, encouraged to continue this as this improves her quality of life -prescribed short course of ibuprofen 800 mg daily until we await ortho response, recent renal function appropriate and discussed risks  -instructed patient to call our clinic if she does not hear from the orthopedics office within 2 weeks so I can place another referral -follow up pending symptoms and ortho evaluation

## 2022-07-15 ENCOUNTER — Ambulatory Visit: Payer: Medicaid Other

## 2022-07-22 ENCOUNTER — Ambulatory Visit
Admission: RE | Admit: 2022-07-22 | Discharge: 2022-07-22 | Disposition: A | Discharge status: Home / Self Care | Payer: Medicaid Other | Source: Ambulatory Visit | Attending: Sports Medicine | Admitting: Sports Medicine

## 2022-07-22 ENCOUNTER — Other Ambulatory Visit: Payer: Self-pay | Admitting: Sports Medicine

## 2022-07-22 DIAGNOSIS — M5416 Radiculopathy, lumbar region: Secondary | ICD-10-CM

## 2022-07-22 MED ORDER — METHYLPREDNISOLONE ACETATE 40 MG/ML INJ SUSP (RADIOLOG
80.0000 mg | Freq: Once | INTRAMUSCULAR | Status: AC
  Administered 2022-07-22: 80 mg via INTRA_ARTICULAR

## 2022-07-22 MED ORDER — IOPAMIDOL (ISOVUE-M 200) INJECTION 41%
1.0000 mL | Freq: Once | INTRAMUSCULAR | Status: AC
  Administered 2022-07-22: 1 mL via INTRA_ARTICULAR

## 2022-07-22 NOTE — Discharge Instructions (Signed)

## 2022-08-03 ENCOUNTER — Other Ambulatory Visit: Payer: Self-pay | Admitting: Family Medicine

## 2022-08-03 DIAGNOSIS — M5416 Radiculopathy, lumbar region: Secondary | ICD-10-CM
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
## 2022-08-05 ENCOUNTER — Ambulatory Visit: Payer: Medicaid Other | Admitting: Physician Assistant

## 2022-08-14 ENCOUNTER — Other Ambulatory Visit: Payer: Self-pay | Admitting: Physician Assistant

## 2022-08-14 ENCOUNTER — Other Ambulatory Visit: Payer: Self-pay | Admitting: Family Medicine

## 2022-08-15 ENCOUNTER — Other Ambulatory Visit: Payer: Self-pay | Admitting: Family Medicine

## 2022-08-15 DIAGNOSIS — M5416 Radiculopathy, lumbar region: Secondary | ICD-10-CM

## 2022-08-17 IMAGING — DX DG KNEE AP/LAT W/ SUNRISE*R*
3 series · 3 of 3 positions shown · non-contrast
Comparison: Commend

CLINICAL DATA: Right knee pain.  No known injury.

EXAM:
RIGHT KNEE 3 VIEWS

[dg knee ap/lat w/ sunrise right (1 of 3)]
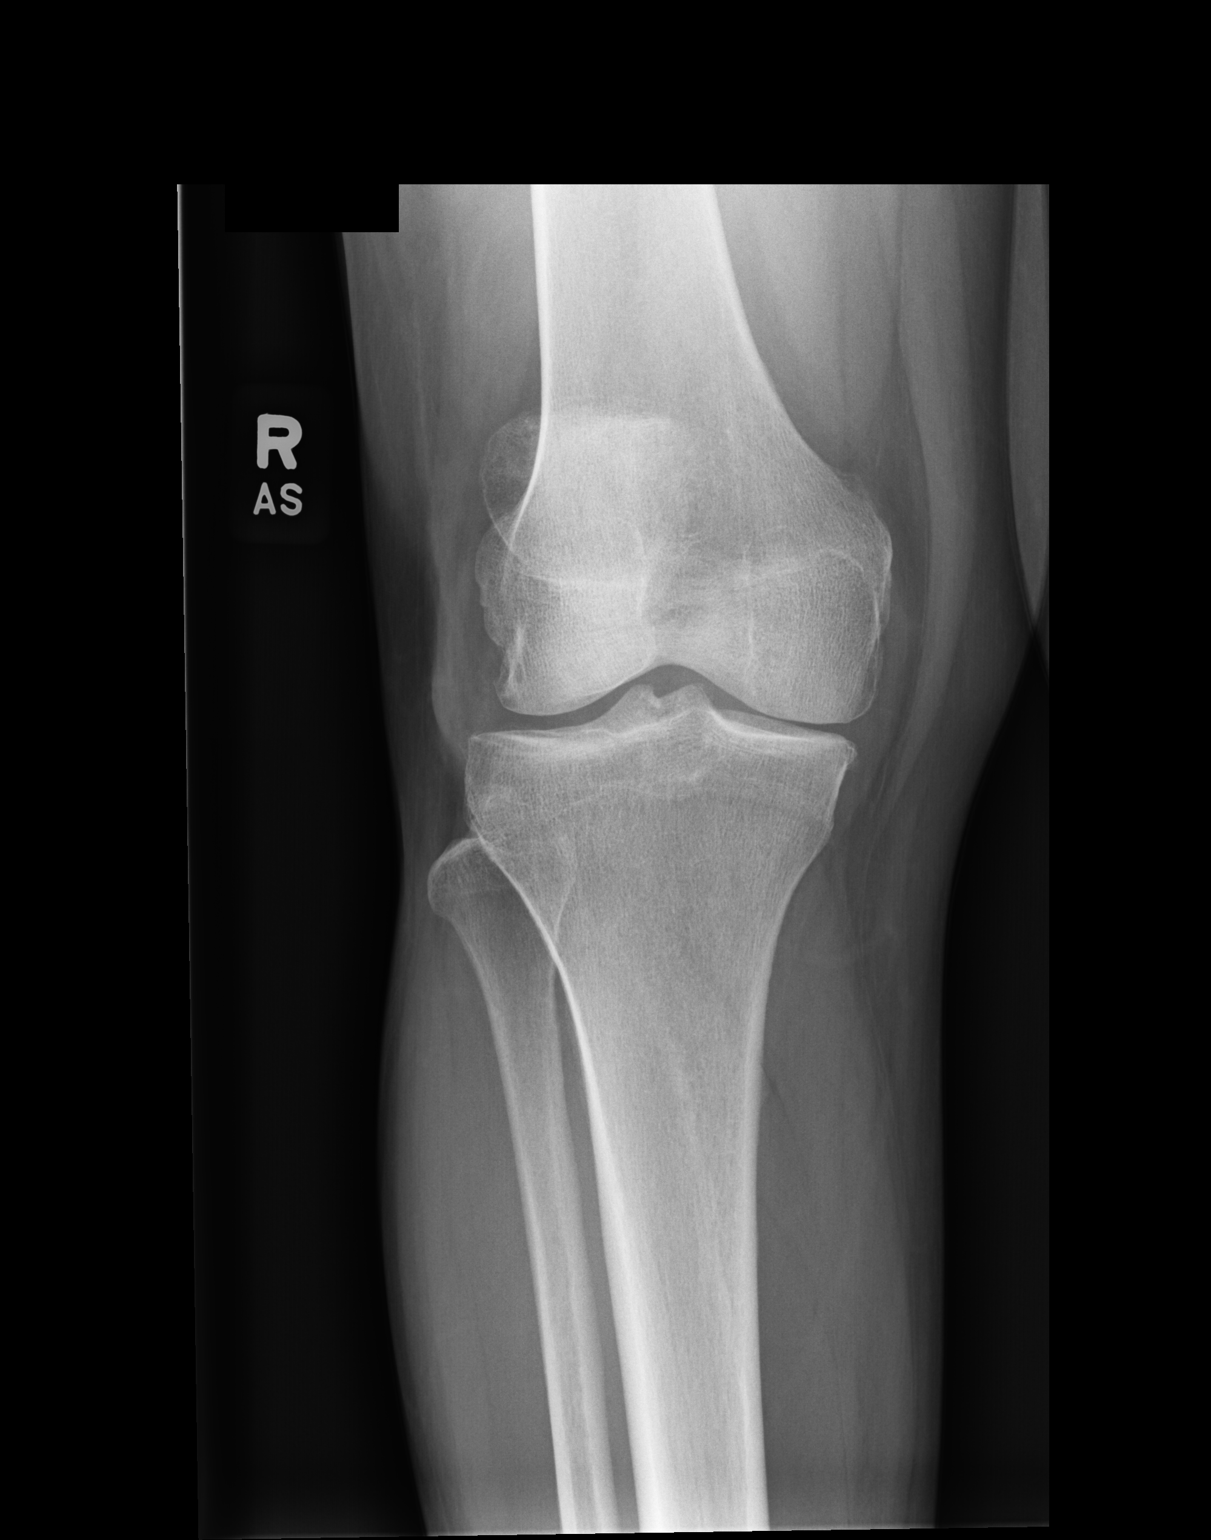

[dg knee ap/lat w/ sunrise right (2 of 3)]
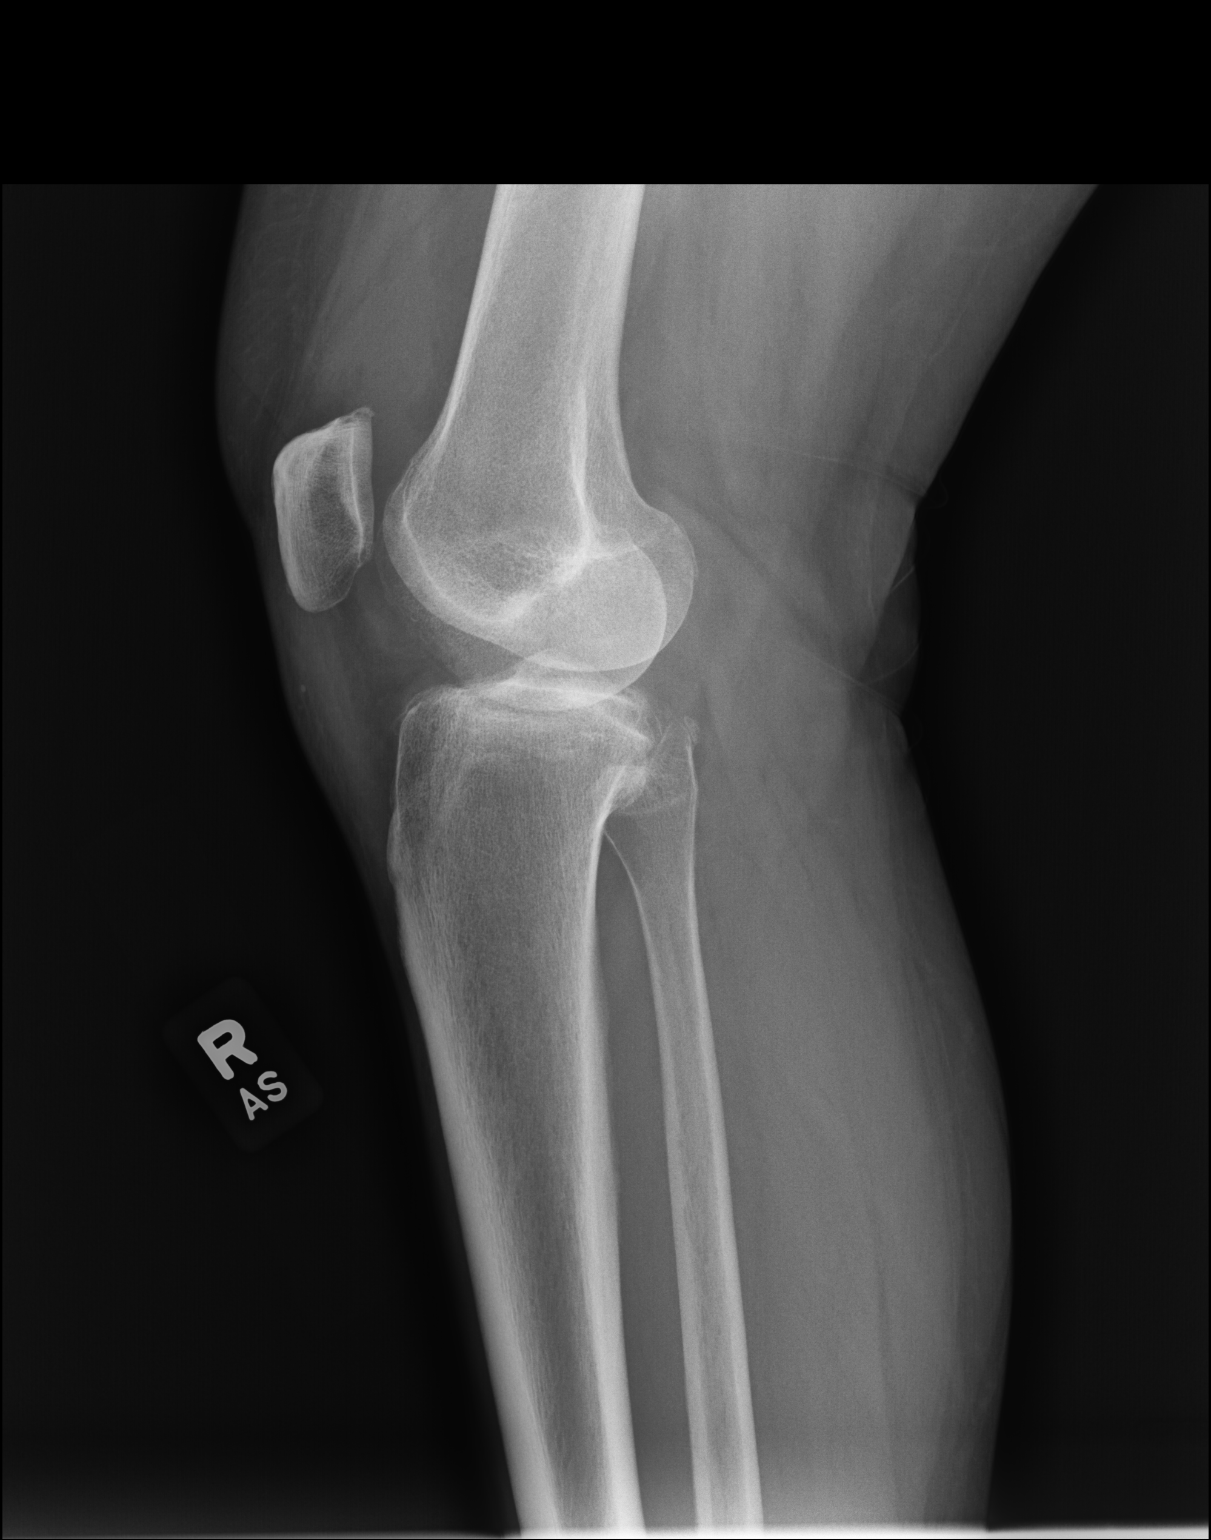

[dg knee ap/lat w/ sunrise right (3 of 3)]
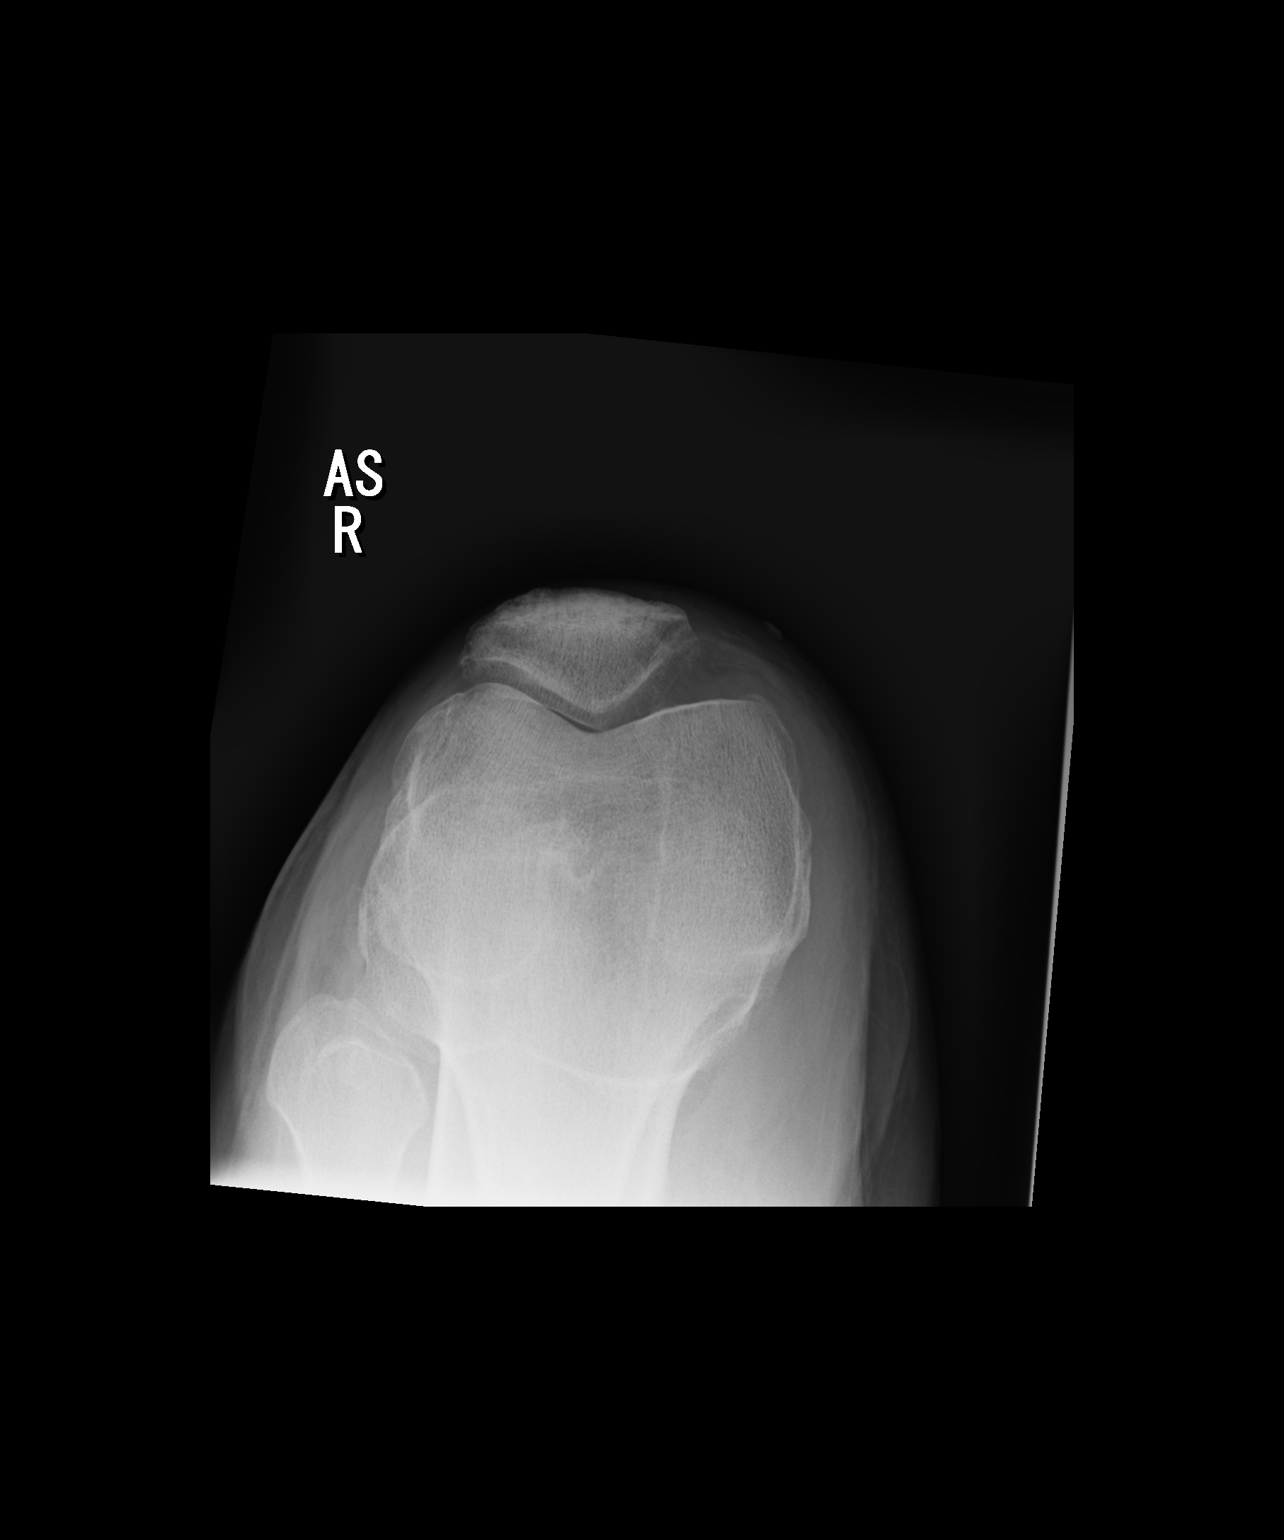

[3 of 3 positions shown; findings below may reference images not displayed]

FINDINGS: There is a small to moderate suprapatellar joint effusion. Mild
medial compartment joint space narrowing with marginal spur
formation. No acute fracture or dislocation identified. No
radio-opaque foreign bodies.
IMPRESSION: 1. Small to moderate suprapatellar joint effusion.
2. Mild medial compartment osteoarthritis.

## 2022-08-19 ENCOUNTER — Other Ambulatory Visit: Payer: Self-pay | Admitting: Family Medicine

## 2022-08-23 ENCOUNTER — Other Ambulatory Visit: Payer: Self-pay | Admitting: *Deleted

## 2022-08-23 DIAGNOSIS — M79661 Pain in right lower leg: Secondary | ICD-10-CM

## 2022-09-06 ENCOUNTER — Ambulatory Visit (HOSPITAL_COMMUNITY)
Admission: RE | Admit: 2022-09-06 | Discharge: 2022-09-06 | Disposition: A | Discharge status: Home / Self Care | Payer: MEDICAID | Source: Ambulatory Visit | Attending: Surgery | Admitting: Surgery

## 2022-09-06 ENCOUNTER — Encounter: Payer: Self-pay | Admitting: Surgery

## 2022-09-06 ENCOUNTER — Ambulatory Visit (INDEPENDENT_AMBULATORY_CARE_PROVIDER_SITE_OTHER): Payer: MEDICAID | Admitting: Surgery

## 2022-09-06 VITALS — BP 143/87 | HR 61 | Temp 97.8°F | Resp 20 | Ht 66.0 in | Wt 167.0 lb

## 2022-09-06 DIAGNOSIS — M7989 Other specified soft tissue disorders: Secondary | ICD-10-CM | POA: Insufficient documentation

## 2022-09-06 DIAGNOSIS — I70213 Atherosclerosis of native arteries of extremities with intermittent claudication, bilateral legs: Secondary | ICD-10-CM | POA: Diagnosis not present

## 2022-09-06 DIAGNOSIS — M79661 Pain in right lower leg: Secondary | ICD-10-CM | POA: Insufficient documentation

## 2022-09-06 LAB — VAS US ABI WITH/WO TBI
Left ABI: 1.09
Right ABI: 1.06

## 2022-09-06 NOTE — Progress Notes (Signed)
[facility] [place]  Patient name: Tonya Mckenzie MRN: [id number] DOB: Dec 21, 1960 Sex: female   REQUESTING PROVIDER:    Lesly Rubenstein   REASON FOR CONSULT:    Leg pain  HISTORY OF PRESENT ILLNESS:   Tonya Mckenzie is a 62 y.o. female, who is referred for evaluation of left thigh pain.  Prior to seeing me, she had injections after visualizing lesions on her back that were thought to be arthritis.  She has had complete resolution of her leg symptoms.  She denies any symptoms of claudication.  She does not have any nonhealing wounds.  She tries to be very healthy and active.  She is a former smoker.  She takes a statin for hypercholesterolemia.  She is medically managed for hypertension.  PAST MEDICAL HISTORY    Past Medical History:  Diagnosis Date   Allergic rhinitis    Anxiety    Bacterial vaginosis    Chronic post-traumatic stress disorder (PTSD)    Cocaine abuse (HCC)    Depression    ETOH abuse    GERD (gastroesophageal reflux disease)    Hyperlipidemia    Hypertension    off meds due to weight loss   Marijuana abuse    Uterine leiomyoma      FAMILY HISTORY   Family History  Problem Relation Age of Onset   Diabetes Mother    Hypertension Mother    Hypercholesterolemia Mother    Heart attack Father    Heart attack Brother    Colon cancer Neg Hx    Esophageal cancer Neg Hx    Rectal cancer Neg Hx    Stomach cancer Neg Hx    Liver cancer Neg Hx    Breast cancer Neg Hx     SOCIAL HISTORY:   Social History   Socioeconomic History   Marital status: Married    Spouse name: Tyrone   Number of children: 4   Years of education: Not on file   Highest education level: Not on file  Occupational History   Occupation: student    Comment: Publishing copy  Tobacco Use   Smoking status: Former    Types: Cigarettes   Smokeless tobacco: Never   Tobacco comments:    never really smoked, when she did was  about 1 cigarette a month  Vaping Use   Vaping Use: Never used  Substance and Sexual Activity   Alcohol use: Not Currently    Comment: quit drinking 2016   Drug use: Not Currently    Types: Cocaine, Marijuana    Comment: reformed went through rehab   Sexual activity: Not on file  Other Topics Concern   Not on file  Social History Narrative   Not on file   Social Determinants of Health   Financial Resource Strain: Not on file  Food Insecurity: Not on file  Transportation Needs: Not on file  Physical Activity: Not on file  Stress: Not on file  Social Connections: Not on file  Intimate Partner Violence: Not on file    ALLERGIES:    No Known Allergies  CURRENT MEDICATIONS:    Current Outpatient Medications  Medication Sig Dispense Refill   amLODipine (NORVASC) 5 MG tablet TAKE 1 TABLET(5 MG) BY MOUTH AT BEDTIME. START TAKING WHEN YOU DECREASE YOUR CLONIDINE TO EVERY DAY 90 tablet 3   ezetimibe (ZETIA) 10 MG tablet TAKE 1 TABLET(10 MG) BY MOUTH DAILY 90 tablet 3   famotidine (PEPCID) 40 MG tablet TAKE  1 TABLET(40 MG) BY MOUTH AT BEDTIME 90 tablet 3   rosuvastatin (CRESTOR) 40 MG tablet TAKE 1 TABLET BY MOUTH EVERY DAY. YOU ARE OVERDUE FOR AN APPT 90 tablet 0   esomeprazole (NEXIUM) 40 MG capsule TAKE 1 CAPSULE(40 MG) BY MOUTH DAILY BEFORE BREAKFAST (Patient not taking: Reported on 09/06/2022) 30 capsule 0   fluticasone (FLONASE) 50 MCG/ACT nasal spray Place 2 sprays into both nostrils daily. (Patient not taking: Reported on 06/02/2022) 16 g 6   ibuprofen (ADVIL) 800 MG tablet TAKE 1 TABLET(800 MG) BY MOUTH DAILY 30 tablet 0   Vitamin D, Ergocalciferol, (DRISDOL) 1.25 MG (50000 UT) CAPS capsule  (Patient not taking: Reported on 03/09/2022)     No current facility-administered medications for this visit.    REVIEW OF SYSTEMS:   [X]  denotes positive finding, [ ]  denotes negative finding Cardiac  Comments:  Chest pain or chest pressure:    Shortness of breath upon exertion:     Short of breath when lying flat:    Irregular heart rhythm:        Vascular    Pain in calf, thigh, or hip brought on by ambulation:    Pain in feet at night that wakes you up from your sleep:     Blood clot in your veins:    Leg swelling:         Pulmonary    Oxygen at home:    Productive cough:     Wheezing:         Neurologic    Sudden weakness in arms or legs:     Sudden numbness in arms or legs:     Sudden onset of difficulty speaking or slurred speech:    Temporary loss of vision in one eye:     Problems with dizziness:         Gastrointestinal    Blood in stool:      Vomited blood:         Genitourinary    Burning when urinating:     Blood in urine:        Psychiatric    Major depression:         Hematologic    Bleeding problems:    Problems with blood clotting too easily:        Skin    Rashes or ulcers:        Constitutional    Fever or chills:     PHYSICAL EXAM:   Vitals:   09/06/22 1337  BP: (!) 143/87  Pulse: 61  Resp: 20  Temp: 97.8 F (36.6 C)  SpO2: 96%  Weight: 167 lb (75.8 kg)  Height: 5\' 6"  (1.676 m)    GENERAL: The patient is a well-nourished female, in no acute distress. The vital signs are documented above. CARDIAC: There is a regular rate and rhythm.  VASCULAR: Palpable pedal pulses PULMONARY: Nonlabored respirations MUSCULOSKELETAL: There are no major deformities or cyanosis. NEUROLOGIC: No focal weakness or paresthesias are detected. SKIN: There are no ulcers or rashes noted. PSYCHIATRIC: The patient has a normal affect.  STUDIES:   I have reviewed her vascular studies with the following findings: ABI/TBIToday's ABIToday's TBIPrevious ABIPrevious TBI  +-------+-----------+-----------+------------+------------+  Right 1.06       0.77                                 +-------+-----------+-----------+------------+------------+  Left  1.09  0.90                                  +-------+-----------+-----------+------------+------------+      ASSESSMENT and PLAN   Leg pain: The patient's symptoms have completely resolved.  In addition, she had normal vascular lab studies as well as palpable pedal pulses.  Therefore I reassured her that her leg discomfort is not related to arterial insufficiency.   Charlena Cross, MD, FACS [facility] [phone] Pager [phone]

## 2022-09-16 ENCOUNTER — Encounter: Payer: Self-pay | Admitting: Gastroenterology

## 2022-09-23 ENCOUNTER — Other Ambulatory Visit: Payer: Self-pay

## 2022-09-23 DIAGNOSIS — M5416 Radiculopathy, lumbar region: Secondary | ICD-10-CM

## 2022-09-23 NOTE — Progress Notes (Signed)
Pt called asking for repeat lumbar facet injections.  Order placed. She is able to get these every 2 months per DRI/insurance.

## 2022-10-06 ENCOUNTER — Encounter: Payer: Self-pay | Admitting: Sports Medicine

## 2022-10-06 DIAGNOSIS — M5416 Radiculopathy, lumbar region: Secondary | ICD-10-CM

## 2022-10-19 ENCOUNTER — Other Ambulatory Visit: Payer: Self-pay | Admitting: Sports Medicine

## 2022-10-19 ENCOUNTER — Other Ambulatory Visit: Payer: Self-pay

## 2022-10-19 DIAGNOSIS — M5416 Radiculopathy, lumbar region: Secondary | ICD-10-CM

## 2022-10-19 DIAGNOSIS — E785 Hyperlipidemia, unspecified: Secondary | ICD-10-CM

## 2022-10-19 MED ORDER — EZETIMIBE 10 MG PO TABS: ORAL_TABLET | ORAL | 0 refills | Status: DC

## 2022-10-19 NOTE — Telephone Encounter (Signed)
Patient presents to clinic regarding concerns with Zetia prescription.   This was previously sent to pharmacy by Dr. Robyne Peers on 03/09/2022. As Dr. Robyne Peers is no longer enrolled in Medicaid, they will need this to be resent by new provider.   Will forward to PCP.   Veronda Prude, RN

## 2022-10-20 ENCOUNTER — Encounter: Payer: Self-pay | Admitting: Sports Medicine

## 2022-10-25 ENCOUNTER — Encounter: Payer: Self-pay | Admitting: Sports Medicine

## 2022-10-27 NOTE — Discharge Instructions (Signed)

## 2022-10-28 ENCOUNTER — Ambulatory Visit
Admission: RE | Admit: 2022-10-28 | Discharge: 2022-10-28 | Disposition: A | Discharge status: Home / Self Care | Payer: MEDICAID | Source: Ambulatory Visit | Attending: Sports Medicine | Admitting: Sports Medicine

## 2022-10-28 DIAGNOSIS — M5416 Radiculopathy, lumbar region: Secondary | ICD-10-CM

## 2022-10-28 MED ORDER — IOPAMIDOL (ISOVUE-M 200) INJECTION 41%
1.0000 mL | Freq: Once | INTRAMUSCULAR | Status: AC
  Administered 2022-10-28: 1 mL via INTRA_ARTICULAR

## 2022-10-28 MED ORDER — METHYLPREDNISOLONE ACETATE 40 MG/ML INJ SUSP (RADIOLOG
80.0000 mg | Freq: Once | INTRAMUSCULAR | Status: AC
  Administered 2022-10-28: 80 mg via INTRA_ARTICULAR

## 2022-11-08 ENCOUNTER — Telehealth (HOSPITAL_COMMUNITY): Payer: Self-pay

## 2022-11-08 NOTE — Telephone Encounter (Signed)
Pt walked in seeking assistance with Drinking. She is interested in CDIOP. Please call pt at [phone] to schedule.,

## 2022-11-09 ENCOUNTER — Other Ambulatory Visit: Payer: Self-pay

## 2022-11-09 ENCOUNTER — Ambulatory Visit (INDEPENDENT_AMBULATORY_CARE_PROVIDER_SITE_OTHER): Payer: MEDICAID | Admitting: Student

## 2022-11-09 ENCOUNTER — Telehealth (HOSPITAL_COMMUNITY): Payer: Self-pay

## 2022-11-09 VITALS — BP 133/92 | HR 85 | Ht 66.0 in | Wt 169.8 lb

## 2022-11-09 DIAGNOSIS — F4310 Post-traumatic stress disorder, unspecified: Secondary | ICD-10-CM

## 2022-11-09 MED ORDER — PRAZOSIN HCL 1 MG PO CAPS
1.0000 mg | ORAL_CAPSULE | Freq: Every day | ORAL | 0 refills | Status: DC
Start: 2022-11-09 — End: 2022-11-16

## 2022-11-09 NOTE — Telephone Encounter (Signed)
This therapist returned Patient's call from yesterday to [facility] inquiring about CD-IOP services.  This therapist reached Voice Mail and left a HIPAA compliant message requesting a return call.
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
## 2022-11-09 NOTE — Telephone Encounter (Signed)
Aidyn returns this therapist call.  Therapist verified her identify by confirming her name and date of birth.  Shatasia says she is looking for CD IOP. She said she goes to [facility] in the morning hours and works in the afternoons.  This therapist explains that our CD-IOP operates from 9am to 12pm on Monday, Wednesday and Friday.  Letetia says she wants to attend IOP in the evenings and prefers IOP over individual.  Therapist tells Amorina that [facility] offers an evening group. Therapist provides the phone number and Ahri says she will call.

## 2022-11-09 NOTE — Patient Instructions (Signed)
It was wonderful to see you today. Thank you for allowing me to be a part of your care. Below is a short summary of what we discussed at your visit today:  I suspect your nightmares could be due to your underlying PTSD.  I have sent in prescription for prazosin which you take 1 mg nightly before bedtime.  I have also placed ambulatory referral to psychiatry I believe you will benefit from seeing a specialist.  I have also included list of therapist covered under Medicaid.  Please call any of these numbers and schedule an appointment with them.  Follow-up in 1 week lets see how you are doing with the medication and how your symptoms are going.  If you have any questions or concerns, please do not hesitate to contact us via phone or MyChart message.   Jerre Simon, MD [facility]     Therapy and Counseling Resources Most providers on this list will take Medicaid. Patients with commercial insurance or Medicare should contact their insurance company to get a list of in network providers.  [facility] (takes children) Location 1: [address] [place], [place] [address] Location 2: [address] [place], [place] [address] [phone]   [facility] (spanish speaking therapist available)(habla espanol)(take medicare and medicaid)  [address], [place], [place] [address], [email or web address] [phone]  [facility]Psychiatry and Counseling [address] [place], [place] [address] [phone]  [facility]   [address], Suite [place], [place] [address]      [phone]  [facility] (spanish available) [address]  [place], [place] [address] [phone]  [facility] (take [facility] and medicare) [address]  [place], [place] [address]       [phone]     [facility] (virtual only) [phone]  [facility]ov[facility]ussma[facility] [address]  [address], [place], [place] [phone]  [facility]:  [address] [place] [phone]  [facility]:  [address] [place]ie Diener [phone]  [facility] (under & uninsured) [address]   [place] [phone]    [email or web address]    [facility] [address]  [place]    [phone]  [facility] -[address]     Phone:  [phone]     [facility]-  [address]  [phone]   [facility] [address]      [place], [place] [phone]  [facility]: [address] [place], [place], [place]  [phone]   [facility] (Spanish therapist) [email or web address]  [address] [place]  [address]   [place] [address]    [phone]    [facility]   [address],  [place], [place]  [phone]   [facility]  [address] [place]  [phone]  [facility]  [address] [place], [place]        [phone]  Open Access/Walk In Clinic under & uninsured  [facility]  [address] [place], [place] [phone] Crisis [phone]  [facility] [address],  (Spanish)   [address], [place]: [phone]) 8:30 - 12; 1 - 2:30  [facility] [employer],  [address] [place], [place]    ([phone]):8:30 -  12; 2 - 3PM  [facility] [employer],  [address],  [place]; [phone]):   Mon - Fri 8 AM - 5 PM  [facility] [address] [place]  MWF 12:30 to 3:00 or call to schedule an appointment  [phone]  Specific Provider options Psychology Today  [email or web address] click on find a therapist  enter your zip code left side and select or tailor a therapist for your specific need.   [facility] Provider Directory [email or web address]  (Medicaid)   Follow all drop down to find a provider  Social Support program [facility] [phone] or PhotoSolver.pl [address], [place], [place] Recovery support and educational   24- Hour Availability:   [facility]  [address] [place], [place] [phone] Crisis [phone]  [facility] [phone]  [place] [facility]  [phone]   [facility]  [phone] (after hours)  Therapeutic Alternative/Mobile Crisis   [phone]  [place] National Suicide Hotline  (815)090-6074 Len Childs)  Call 911 or go to emergency room  [facility]  [phone]);  [place] and [employer]  [phone]); [place], [place], [place], [place], [place], [place], [place]
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
## 2022-11-12 ENCOUNTER — Encounter: Payer: Self-pay | Admitting: Pharmacist

## 2022-11-16 ENCOUNTER — Encounter: Payer: Self-pay | Admitting: Student

## 2022-11-16 ENCOUNTER — Ambulatory Visit: Payer: MEDICAID | Admitting: Student

## 2022-11-16 DIAGNOSIS — F4310 Post-traumatic stress disorder, unspecified: Secondary | ICD-10-CM

## 2022-11-16 DIAGNOSIS — E785 Hyperlipidemia, unspecified: Secondary | ICD-10-CM

## 2022-11-16 DIAGNOSIS — I10 Essential (primary) hypertension: Secondary | ICD-10-CM

## 2022-11-16 MED ORDER — AMLODIPINE BESYLATE 5 MG PO TABS
ORAL_TABLET | ORAL | 1 refills | Status: DC
Start: 2022-11-16 — End: 2023-02-14

## 2022-11-16 MED ORDER — ROSUVASTATIN CALCIUM 40 MG PO TABS
ORAL_TABLET | ORAL | 1 refills | Status: DC
Start: 2022-11-16 — End: 2023-05-09

## 2022-11-16 MED ORDER — EZETIMIBE 10 MG PO TABS
ORAL_TABLET | ORAL | 0 refills | Status: DC
Start: 2022-11-16 — End: 2023-04-11

## 2022-11-16 MED ORDER — PRAZOSIN HCL 1 MG PO CAPS
1.0000 mg | ORAL_CAPSULE | Freq: Every day | ORAL | 0 refills | Status: DC
Start: 2022-11-16 — End: 2023-03-23

## 2022-11-16 NOTE — Patient Instructions (Signed)
It was great to see you! Thank you for allowing me to participate in your care!   I recommend that you always bring your medications to each appointment as this makes it easy to ensure we are on the correct medications and helps [place] not miss when refills are needed.  Our plans for today:  - Your blood pressure is slightly up today-what we can do is an ambulatory BP monitor in next 2-3 weeks to make sure that it is normal at home - I have refilled your medications for 90 days!   Take care and seek immediate care sooner if you develop any concerns. Please remember to show up 15 minutes before your scheduled appointment time!  Levin Erp, MD [facility]
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
## 2022-11-16 NOTE — Assessment & Plan Note (Signed)
Has been doing well while on prazosin 1 mg daily.  Denies any additional nightmares or sleepwalking symptoms. -Refilled prazosin for 90 days

## 2022-11-16 NOTE — Assessment & Plan Note (Signed)
Patient's blood pressure is not controlled today. BP: (!) 152/88. Goal of 130/80. Patient's medication regimen includes amlodipine 5 mg daily. -Changes to current regimen include continue amlodipine -Referral to pharmacy clinic due to Discrepancy between home and clinic BP -Follow up in 2 to 3 weeks for ambulatory blood pressure monitoring to see if this is whitecoat hypertension

## 2022-11-18 ENCOUNTER — Other Ambulatory Visit: Payer: Self-pay

## 2022-11-18 DIAGNOSIS — E785 Hyperlipidemia, unspecified: Secondary | ICD-10-CM

## 2022-12-09 ENCOUNTER — Encounter: Payer: Self-pay | Admitting: Pharmacist

## 2022-12-09 ENCOUNTER — Ambulatory Visit: Payer: MEDICAID | Admitting: Pharmacist

## 2022-12-09 VITALS — BP 132/92 | Ht 66.5 in | Wt 176.4 lb

## 2022-12-09 DIAGNOSIS — I10 Essential (primary) hypertension: Secondary | ICD-10-CM | POA: Diagnosis not present

## 2022-12-09 NOTE — Patient Instructions (Signed)
Blood Pressure Activity Diary Time Lying down/ Sleeping Walking/ Exercise Stressed/ Angry Headache/ Pain Dizzy  9 AM       10 AM       11 AM       12 PM       1 PM       2 PM       Time Lying down/ Sleeping Walking/ Exercise Stressed/ Angry Headache/ Pain Dizzy  3 PM       4 PM        5 PM       6 PM       7 PM       8 PM       Time Lying down/ Sleeping Walking/ Exercise Stressed/ Angry Headache/ Pain Dizzy  9 PM       10 PM       11 PM       12 AM       1 AM       2 AM       3 AM       Time Lying down/ Sleeping Walking/ Exercise Stressed/ Angry Headache/ Pain Dizzy  4 AM       5 AM       6 AM       7 AM       8 AM       9 AM       10 AM        Time you woke up: _________                  Time you went to sleep:__________  Come back tomorrow at 8:30 to have the monitor removed  Call the [facility] if you have any questions before then ([phone])  Wearing the Blood Pressure Monitor The cuff will inflate every 20 minutes during the day and every 30 minutes while you sleep. Fill out the blood pressure-activity diary during the day, especially during activities that may affect your reading -- such as exercise, stress, walking, taking your blood pressure medications  Important things to know: Avoid taking the monitor off for the next 24 hours, unless it causes you discomfort or pain. Do NOT get the monitor wet and do NOT try to clean the monitor with any cleaning products. Do NOT put the monitor on anyone else's arm. When the cuff inflates, avoid excess movement. Let the cuffed arm hang loosely, slightly away from the body. Avoid flexing the muscles or moving the hand/fingers. Remember to fill out the blood pressure activity diary. If you experience severe pain or unusual pain (not associated with getting your blood pressure checked), remove the monitor.  Troubleshooting:  Code  Troubleshooting   1  Check cuff position, tighten cuff   2, 3  Remain still  during reading   4, 87  Check air hose connections and make sure cuff is tight   85, 89  Check hose connections and make tubing is not crimped   86  Push START/STOP to restart reading   88, 91  Retry by pushing START/STOP   90  Replace batteries. If problem persists, remove monitor and bring back to   clinic at follow up   97, 98, 99  Service required - Remove monitor and bring back to clinic at follow up
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
## 2022-12-09 NOTE — Assessment & Plan Note (Signed)
History of hypertension longstanding for 5 years years currently taking; amlodipine 5 mg daily with goal presssure of <130/80 mmHg.     -Placed blood pressure cuff, provided education, patient instructed to wear cuff for 24 hours and return tomorrow to review results.

## 2022-12-10 ENCOUNTER — Ambulatory Visit: Payer: MEDICAID | Admitting: Pharmacist

## 2022-12-10 VITALS — BP 118/72 | HR 73

## 2022-12-10 DIAGNOSIS — I10 Essential (primary) hypertension: Secondary | ICD-10-CM | POA: Diagnosis not present

## 2022-12-10 NOTE — Progress Notes (Signed)
S:     Chief Complaint  Patient presents with   Medication Management    Amb BP - Day #2   62 y.o. female who presents for hypertension evaluation, education, and management.  Patient arrives in good spirits and presents without any assistance. She is dressed for work.  S PMH is significant for PTSD.  Patient returns to clinic with 24 hour blood pressure monitor and reports no issues, did not sleep well but overall, reports having a restful day while wearing the ambulatory blood pressure monitor. Patient reports they were able to wear the Ambulatory Blood Pressure Cuff for the entire 24 evaluation period.   O:  Review of Systems  All other systems reviewed and are negative.   Physical Exam Pulmonary:     Effort: Pulmonary effort is normal.  Neurological:     Mental Status: She is alert.  Psychiatric:        Mood and Affect: Mood normal.        Behavior: Behavior normal.        Thought Content: Thought content normal.        Judgment: Judgment normal.     Last 3 Office BP readings: BP Readings from Last 3 Encounters:  12/09/22 (!) 132/92  11/16/22 (!) 152/88  11/09/22 (!) 133/92    Clinical Atherosclerotic Cardiovascular Disease (ASCVD): No  The 10-year ASCVD risk score (Arnett DK, et al., 2019) is: 5.6%   Values used to calculate the score:     Age: 61 years     Sex: Female     Is Non-Hispanic African American: Yes     Diabetic: No     Tobacco smoker: No     Systolic Blood Pressure: 132 mmHg     Is BP treated: Yes     HDL Cholesterol: 92 mg/dL     Total Cholesterol: 204 mg/dL  Basic Metabolic Panel    Component Value Date/Time   NA 141 05/26/2022 1445   K 4.3 05/26/2022 1445   CL 105 05/26/2022 1445   CO2 24 05/26/2022 1445   GLUCOSE 102 (H) 05/26/2022 1445   GLUCOSE 93 02/16/2022 1035   BUN 13 05/26/2022 1445   CREATININE 1.07 (H) 05/26/2022 1445   CALCIUM 9.5 05/26/2022 1445   GFRNONAA 54 (L) 03/03/2020 1000   GFRAA 81 05/15/2019 1617    Renal  function: CrCl cannot be calculated (Patient's most recent lab result is older than the maximum 21 days allowed.).   ABPM Study Data: Arm Placement left arm  Overall Mean 24hr BP:   112/69 mmHg  HR: 72  Daytime Mean BP:  118/72 mmHg  HR: 73  Nighttime Mean BP:  95/58 mmHg  HR: 69  Dipping Pattern: Yes.    Sys:   19%   Dia: 19%   [normal dipping ~10-20%]   For Office Goal BP of <130/80 mmHg:  ABPM thresholds: Overall BP <125/75 mmHg, daytime BP <130/80 mmHg, sleeptime BP <110/65 mmHg   Patient is participating in a Managed Medicaid Plan:  Yes   A/P: History of hypertension diagnosed in 2021 and currently taking amlodipine with goal presssure of <130/80.     Found to have normal blood pressure with 24-hour ambulatory blood pressure evaluation which demonstrates an average AWAKE blood pressure of 118/72 mmHg.  Nocturnal dipping pattern is normal with 19% dipping.   Most likely patient has some degree of "white-coat HTN".   Changes to medications -Continued Amlodipine 5mg  daily  Results reviewed and written information  provided.    Written patient instructions provided. Patient verbalized understanding of treatment plan.  Total time in face to face counseling 11 minutes.    Follow-up:  Pharmacist None plannned PCP clinic visit in PRN

## 2022-12-10 NOTE — Assessment & Plan Note (Signed)
History of hypertension diagnosed in 2021 and currently taking amlodipine with goal presssure of <130/80.     Found to have normal blood pressure with 24-hour ambulatory blood pressure evaluation which demonstrates an average AWAKE blood pressure of 118/72 mmHg.  Nocturnal dipping pattern is normal with 19% dipping.   Most likely patient has some degree of "white-coat HTN".   Changes to medications -Continued Amlodipine 5mg  daily

## 2022-12-10 NOTE — Patient Instructions (Signed)
It was nice to see you today!  Thank you for completing the blood pressure monitoring evaluation.  Your goal blood pressure is < 130/80 mmHg   Medication Changes:  Continue all other medication the same.   Monitor blood pressure at home and keep a log (on a piece of paper) to bring with you to your next visit.

## 2022-12-14 NOTE — Progress Notes (Signed)
Reviewed and agree with Dr Koval's plan.   

## 2022-12-21 ENCOUNTER — Ambulatory Visit (HOSPITAL_COMMUNITY): Payer: MEDICAID | Admitting: Mental Health

## 2022-12-31 ENCOUNTER — Ambulatory Visit: Payer: MEDICAID | Admitting: Student

## 2022-12-31 NOTE — Progress Notes (Deleted)
  SUBJECTIVE:   CHIEF COMPLAINT / HPI:   Constipation:  PERTINENT  PMH / PSH: HTN, HLD, MDD, history of substance use  OBJECTIVE:  There were no vitals taken for this visit. Physical Exam   ASSESSMENT/PLAN:   Assessment & Plan  No follow-ups on file. Shelby Mattocks, DO 12/31/2022, 1:22 PM PGY-3, [facility] {    This will disappear when note is signed, click to select method of visit    :1}

## 2023-01-04 NOTE — Progress Notes (Deleted)
  SUBJECTIVE:   CHIEF COMPLAINT / HPI:   Constipation   PERTINENT  PMH / PSH: ***  Past Medical History:  Diagnosis Date   Allergic rhinitis    Anxiety    Bacterial vaginosis    Chronic post-traumatic stress disorder (PTSD)    Cocaine abuse (HCC)    Depression    ETOH abuse    GERD (gastroesophageal reflux disease)    Hyperlipidemia    Hypertension    off meds due to weight loss   Marijuana abuse    Uterine leiomyoma    OBJECTIVE:  There were no vitals taken for this visit. Physical Exam   ASSESSMENT/PLAN:   Assessment & Plan  No follow-ups on file. Bess Kinds, MD 01/04/2023, 5:15 PM PGY-***, [facility] {    This will disappear when note is signed, click to select method of visit    :1}

## 2023-01-05 ENCOUNTER — Other Ambulatory Visit: Payer: Self-pay

## 2023-01-05 ENCOUNTER — Ambulatory Visit: Payer: MEDICAID | Admitting: Student

## 2023-01-05 DIAGNOSIS — M5416 Radiculopathy, lumbar region: Secondary | ICD-10-CM

## 2023-01-06 ENCOUNTER — Other Ambulatory Visit: Payer: Self-pay | Admitting: Family Medicine

## 2023-01-06 ENCOUNTER — Encounter: Payer: Self-pay | Admitting: Family Medicine

## 2023-01-06 DIAGNOSIS — M5416 Radiculopathy, lumbar region: Secondary | ICD-10-CM

## 2023-01-12 NOTE — Discharge Instructions (Signed)

## 2023-01-13 ENCOUNTER — Ambulatory Visit
Admission: RE | Admit: 2023-01-13 | Discharge: 2023-01-13 | Disposition: A | Discharge status: Home / Self Care | Payer: MEDICAID | Source: Ambulatory Visit | Attending: Family Medicine | Admitting: Family Medicine

## 2023-01-13 DIAGNOSIS — M5416 Radiculopathy, lumbar region: Secondary | ICD-10-CM

## 2023-01-13 MED ORDER — IOPAMIDOL (ISOVUE-M 200) INJECTION 41%
1.0000 mL | Freq: Once | INTRAMUSCULAR | Status: AC
  Administered 2023-01-13: 1 mL via INTRA_ARTICULAR

## 2023-01-13 MED ORDER — METHYLPREDNISOLONE ACETATE 40 MG/ML INJ SUSP (RADIOLOG
80.0000 mg | Freq: Once | INTRAMUSCULAR | Status: AC
  Administered 2023-01-13: 80 mg via INTRA_ARTICULAR

## 2023-01-26 ENCOUNTER — Ambulatory Visit: Payer: MEDICAID | Admitting: Student

## 2023-02-01 NOTE — Progress Notes (Signed)
    SUBJECTIVE:   CHIEF COMPLAINT / HPI: Constipation  Constipation Has been having new constipation and bloating for the past couple of months. They describe their bowel movements as small and hard, like marbles. Denies any blood in stool or tarry stools. Last BM yesterday. They had a normal colonoscopy in 2016.   Left Ear Problem They also report a new symptom of ringing and popping in their left ear, described as a tiny bell sound. They had tubes in their ears as a child. No fevers or sick symptoms. Similar to "when she's on a plane".  Eye Irritation They also mention a thick mucus-like substance in their eyes upon waking in the morning, which clears after wiping. They sometimes sleep with their eyes slightly open and occasionally forget to remove their eyelashes at night.  PERTINENT  PMH / PSH: reviewed  OBJECTIVE:   BP 127/86   Pulse 66   Ht 5\' 6"  (1.676 m)   Wt 179 lb 2 oz (81.3 kg)   SpO2 100%   BMI 28.91 kg/m   General: Well appearing, NAD, awake, alert, responsive to questions Head: Normocephalic atraumatic, conjunctiva clear bilaterally, false lashes in place; TM and canal clear bilaterally, no pain with pinna manipulation, left TM with small whitish scar but no effusion or perforation present; significant nasal congestion bilaterally Respiratory: chest rises symmetrically,  no increased work of breathing Abdomen: Soft, non-tender, non-distended Extremities: Moves upper and lower extremities freely, no edema in LE  ASSESSMENT/PLAN:   Assessment & Plan Constipation, unspecified constipation type New onset constipation and patient over 60 years old.  No red flag symptoms of blood in stool however given this new change in bowel movement referred patient to GI again for colonoscopy.  Will give her medications as well to help with bowel movement in the meantime. -MiraLAX twice daily, discussed titration with patient until having regular bowel movements -Senna twice daily,   discussed titration with patient until having regular bowel movements -GI referral Eustachian tube disorder, bilateral Most consistent with eustachian tube disorder.  She is not doing any Flonase currently.  She is amenable to trying this to see if this may help. -Flonase reordered and discussed daily use -If not improving could consider ENT referral Eye irritation Suspect most likely related to her false lashes use at nighttime. No infectious symptoms and benign eye exam. -Remain flashes at nighttime -Optometry follow-up -Warm compresses/lubricating drops   Levin Erp, MD [facility]

## 2023-02-02 ENCOUNTER — Ambulatory Visit (INDEPENDENT_AMBULATORY_CARE_PROVIDER_SITE_OTHER): Payer: MEDICAID | Admitting: Student

## 2023-02-02 ENCOUNTER — Encounter: Payer: Self-pay | Admitting: Student

## 2023-02-02 VITALS — BP 127/86 | HR 66 | Ht 66.0 in | Wt 179.1 lb

## 2023-02-02 DIAGNOSIS — H6993 Unspecified Eustachian tube disorder, bilateral: Secondary | ICD-10-CM | POA: Diagnosis not present

## 2023-02-02 DIAGNOSIS — K5900 Constipation, unspecified: Secondary | ICD-10-CM | POA: Diagnosis not present

## 2023-02-02 DIAGNOSIS — H5789 Other specified disorders of eye and adnexa: Secondary | ICD-10-CM

## 2023-02-02 MED ORDER — POLYETHYLENE GLYCOL 3350 17 GM/SCOOP PO POWD
17.0000 g | Freq: Two times a day (BID) | ORAL | 1 refills | Status: AC | PRN
Start: 2023-02-02 — End: ?

## 2023-02-02 MED ORDER — SENNA 8.6 MG PO TABS
1.0000 | ORAL_TABLET | Freq: Two times a day (BID) | ORAL | 0 refills | Status: DC
Start: 2023-02-02 — End: 2023-05-26

## 2023-02-02 NOTE — Patient Instructions (Signed)
It was great to see you! Thank you for allowing me to participate in your care!   Our plans for today:  - I am referring to GI for another colonoscopy--I am starting miralax 2 times daily and senna 2 times daily, once having regular bowel movements you can back down to just miralax daily - This appear to be eustachian tube disorder -- if using flonase and not improving we can do ENT referral - take lashes off at night, warm compresses over eyes  Take care and seek immediate care sooner if you develop any concerns.  Levin Erp, MD

## 2023-02-03 NOTE — Assessment & Plan Note (Signed)
Most consistent with eustachian tube disorder.  She is not doing any Flonase currently.  She is amenable to trying this to see if this may help. -Flonase reordered and discussed daily use -If not improving could consider ENT referral

## 2023-02-12 ENCOUNTER — Other Ambulatory Visit: Payer: Self-pay | Admitting: Student

## 2023-02-12 DIAGNOSIS — I10 Essential (primary) hypertension: Secondary | ICD-10-CM

## 2023-03-23 ENCOUNTER — Other Ambulatory Visit: Payer: Self-pay | Admitting: Student

## 2023-03-23 DIAGNOSIS — F4310 Post-traumatic stress disorder, unspecified: Secondary | ICD-10-CM

## 2023-04-04 ENCOUNTER — Ambulatory Visit (INDEPENDENT_AMBULATORY_CARE_PROVIDER_SITE_OTHER): Payer: MEDICAID | Admitting: Family Medicine

## 2023-04-04 VITALS — BP 132/84 | Ht 66.0 in | Wt 170.0 lb

## 2023-04-04 DIAGNOSIS — M5416 Radiculopathy, lumbar region: Secondary | ICD-10-CM | POA: Diagnosis not present

## 2023-04-04 MED ORDER — KETOROLAC TROMETHAMINE 60 MG/2ML IM SOLN
60.0000 mg | Freq: Once | INTRAMUSCULAR | Status: AC
Start: 2023-04-04 — End: 2023-04-04
  Administered 2023-04-04: 60 mg via INTRAMUSCULAR

## 2023-04-04 NOTE — Patient Instructions (Signed)
We will go ahead with an epidural injection for spinal stenosis - call me a week after you have this done to let me know how you're doing. Consider gabapentin, nortriptyline for nerve pain in the future. Consider neurosurgery consultation if injections aren't helping enough. Focus on flexion based strengthening and avoid extending your back as much as possible. You were given a toradol injection today.

## 2023-04-05 ENCOUNTER — Encounter: Payer: Self-pay | Admitting: Family Medicine

## 2023-04-05 NOTE — Progress Notes (Signed)
PCP: Levin Erp, MD  Subjective:   HPI: Patient is a 63 y.o. female here for low back pain.  05/27/22: Seen multiple times by [facility] for this complaint for past year. Las seen 2/26 for lumbar radiculopathy/bilateral hamstring pain. Received toradol shot at that time which seemed to help with pain. MRI ordered but not gotten yet. Seen in [facility] clinic yesterday for similar pain-prescribed meloxicam but patient has not picked this up as she prefers IM injections instead of oral pills. Checked mag (normal), lytes and LFTs (slightly elevated). They placed [facility]VS consult for ABIs. She is on long standing statin therapy (rosuvastatin 40 mg) as well which could be contributing.  Today she says the pain is worst when she wakes up in the morning. It feels better after she flexes forward and also after rubbing the backs of her thighs. The pain is in her bilateral buttocks, hamstrings and down to calf but never to her foot. She says moving around helps her feel better. She does exercise bike and walking on treadmill which helps. She cannot walk at an incline because that makes it hurt much more. She describes the pain as a sore feeling - "like after you have had a hard workout." No shocks down to feet. No weakness/sensation loss.  She says that every morning she gets in a tub with hot water and massages her legs which has been helping. She says she feels this sensation everyday but some days are better than others. No recent trauma to back- she says maybe 20-30 years ago she had possibly fractured her tailbone but didn't go to doctor and just used donut pillow for a bit.  She is not taking anything for pain control, she does not want to rely on oral medication. Sometimes takes liquid ibuprofen. She will be starting physical therapy next month.  04/04/23: Patient returns with continued low back/buttock pain radiating down both legs. Has been severe over past 2 weeks. Gets better with exercise. Worse with  extension, better with flexion. No new injury. Had facet injections 5/23, 8/29, and 11/14 with transient relief from all of these though pain recurred in each instance.  Past Medical History:  Diagnosis Date   Allergic rhinitis    Anxiety    Bacterial vaginosis    Chronic post-traumatic stress disorder (PTSD)    Cocaine abuse (HCC)    Depression    ETOH abuse    GERD (gastroesophageal reflux disease)    Hyperlipidemia    Hypertension    off meds due to weight loss   Marijuana abuse    Uterine leiomyoma     Current Outpatient Medications on File Prior to Visit  Medication Sig Dispense Refill   amLODipine (NORVASC) 5 MG tablet TAKE 1 TABLET(5 MG) BY MOUTH AT BEDTIME 90 tablet 1   esomeprazole (NEXIUM) 40 MG capsule TAKE 1 CAPSULE(40 MG) BY MOUTH DAILY BEFORE BREAKFAST (Patient not taking: Reported on 09/06/2022) 30 capsule 0   ezetimibe (ZETIA) 10 MG tablet TAKE 1 TABLET(10 MG) BY MOUTH DAILY 90 tablet 0   famotidine (PEPCID) 40 MG tablet TAKE 1 TABLET(40 MG) BY MOUTH AT BEDTIME (Patient not taking: Reported on 12/09/2022) 90 tablet 3   fluticasone (FLONASE) 50 MCG/ACT nasal spray Place 2 sprays into both nostrils daily. (Patient not taking: Reported on 06/02/2022) 16 g 6   polyethylene glycol powder (GLYCOLAX/MIRALAX) 17 GM/SCOOP powder Take 17 g by mouth 2 (two) times daily as needed. 3350 g 1   prazosin (MINIPRESS) 1 MG capsule TAKE  1 CAPSULE(1 MG) BY MOUTH AT BEDTIME 90 capsule 0   rosuvastatin (CRESTOR) 40 MG tablet TAKE 1 TABLET BY MOUTH EVERY DAY. 90 tablet 1   senna (SENOKOT) 8.6 MG TABS tablet Take 1 tablet (8.6 mg total) by mouth 2 (two) times daily. 120 tablet 0   No current facility-administered medications on file prior to visit.    Past Surgical History:  Procedure Laterality Date   ABDOMINAL HYSTERECTOMY     left ovaries, took cervix.  Performed for heavy periods and anemia   BICEPT TENODESIS Left 03/06/2020   Procedure: BICEPS TENODESIS;  Surgeon: Bjorn Pippin, MD;   Location: [facility];  Service: Orthopedics;  Laterality: Left;   CHOLECYSTECTOMY     SHOULDER ARTHROSCOPY WITH ROTATOR CUFF REPAIR AND SUBACROMIAL DECOMPRESSION Left 03/06/2020   Procedure: SHOULDER ARTHROSCOPY WITH ROTATOR CUFF REPAIR AND SUBACROMIAL DECOMPRESSION PARTIAL ACROMIOPLASTY AND DISTAL CLAVICULECTOMY;  Surgeon: Bjorn Pippin, MD;  Location: [facility];  Service: Orthopedics;  Laterality: Left;   thumb surgery     right   TOE SURGERY Left    TONSILLECTOMY     TYMPANOSTOMY TUBE PLACEMENT      No Known Allergies  BP 132/84   Ht 5\' 6"  (1.676 m)   Wt 170 lb (77.1 kg)   BMI 27.44 kg/m      10/31/2020    9:47 AM 11/14/2020    9:15 AM 11/28/2020    8:50 AM 11/28/2020    9:16 AM 12/17/2020    3:21 PM 01/28/2021    9:53 AM  [facility]  Frequency of aerobic exercise (# of days/week) 4 4 4 4 4 4   Average time in minutes 40 40 40 5 5 5   Frequency of strengthening activities (# of days/week) 4 4 4 2 2 2         No data to display              Objective:  Physical Exam:  Gen: NAD, comfortable in exam room  Back: No gross deformity, scoliosis. No midline or bony TTP. FROM, pain with extension. Strength LEs 5/5 all muscle groups.   2+ MSRs in patellar and achilles tendons, equal bilaterally. Negative SLRs. Sensation intact to light touch bilaterally.   Assessment & Plan:  1. Low back pain with radiation into bilateral lower extremities - transient benefit with facet injections.  We discussed options including trial of epidural injection(s) instead, gabapentin or nortriptyline, neurosurgery consultation.  She will continue with her home exercise program.  IM toradol given today.  Will set up epidural injection at L3-4 where spinal stenosis is moderate.

## 2023-04-05 NOTE — Progress Notes (Signed)
.  POV

## 2023-04-08 ENCOUNTER — Ambulatory Visit: Payer: MEDICAID | Admitting: Nurse Practitioner

## 2023-04-08 NOTE — Progress Notes (Deleted)
 04/08/2023 Tonya Mckenzie [id number] 1961/02/01   Chief Complaint:  History of Present Illness: Tonya Mckenzie. Tonya Mckenzie is a 63 year old female with a past medical history of anxiety, depression, PTSD, hypertension, hyperlipidemia, marijuana and cocaine use, GERD and constipation.  Status post cholecystectomy and hysterectomy. She is known by Dr. Legrand.      Latest Ref Rng & Units 02/16/2022   10:35 AM 08/21/2020   11:49 AM 07/08/2017    7:40 PM  CBC  WBC 4.0 - 10.5 K/uL 7.0  8.2  10.6   Hemoglobin 12.0 - 15.0 g/dL 86.3  85.4  85.6   Hematocrit 36.0 - 46.0 % 41.2  43.8  44.6   Platelets 150.0 - 400.0 K/uL 213.0  246  208         Latest Ref Rng & Units 05/26/2022    2:45 PM 03/09/2022   10:43 AM 02/16/2022   10:35 AM  CMP  Glucose 70 - 99 mg/dL 897  99  93   BUN 8 - 27 mg/dL 13  14  10    Creatinine 0.57 - 1.00 mg/dL 8.92  9.06  9.00   Sodium 134 - 144 mmol/L 141  144  143   Potassium 3.5 - 5.2 mmol/L 4.3  4.3  3.7   Chloride 96 - 106 mmol/L 105  105  108   CO2 20 - 29 mmol/L 24  23  28    Calcium  8.7 - 10.3 mg/dL 9.5  9.7  9.1   Total Protein 6.0 - 8.5 g/dL 6.6  6.3  6.3   Total Bilirubin 0.0 - 1.2 mg/dL 0.2  0.4  0.4   Alkaline Phos 44 - 121 IU/L 111  106  86   AST 0 - 40 IU/L 29  67  100   ALT 0 - 32 IU/L 54  141  171     PAST GI PROCEDURES:   EGD 04/16/2016: - Normal esophagus.  - Erythematous mucosa in the antrum. Biopsied.  - Normal examined duodenum  Colonoscopy 05/09/2014: -Normal colonoscopy -Recall colonoscopy 10 years  Past Medical History:  Diagnosis Date   Allergic rhinitis    Anxiety    Bacterial vaginosis    Chronic post-traumatic stress disorder (PTSD)    Cocaine abuse (HCC)    Depression    ETOH abuse    GERD (gastroesophageal reflux disease)    Hyperlipidemia    Hypertension    off meds due to weight loss   Marijuana abuse    Uterine leiomyoma    Past Surgical History:  Procedure Laterality Date   ABDOMINAL HYSTERECTOMY     left ovaries, took  cervix.  Performed for heavy periods and anemia   BICEPT TENODESIS Left 03/06/2020   Procedure: BICEPS TENODESIS;  Surgeon: Cristy Bonner DASEN, MD;  Location: [facility];  Service: Orthopedics;  Laterality: Left;   CHOLECYSTECTOMY     SHOULDER ARTHROSCOPY WITH ROTATOR CUFF REPAIR AND SUBACROMIAL DECOMPRESSION Left 03/06/2020   Procedure: SHOULDER ARTHROSCOPY WITH ROTATOR CUFF REPAIR AND SUBACROMIAL DECOMPRESSION PARTIAL ACROMIOPLASTY AND DISTAL CLAVICULECTOMY;  Surgeon: Cristy Bonner DASEN, MD;  Location: [facility];  Service: Orthopedics;  Laterality: Left;   thumb surgery     right   TOE SURGERY Left    TONSILLECTOMY     TYMPANOSTOMY TUBE PLACEMENT      Current Medications, Allergies, Past Medical History, Past Surgical History, Family History and Social History were reviewed in [employer] record.   Review  of Systems:   Constitutional: Negative for fever, sweats, chills or weight loss.  Respiratory: Negative for shortness of breath.   Cardiovascular: Negative for chest pain, palpitations and leg swelling.  Gastrointestinal: See HPI.  Musculoskeletal: Negative for back pain or muscle aches.  Neurological: Negative for dizziness, headaches or paresthesias.    Physical Exam: There were no vitals taken for this visit. General: in no acute distress. Head: Normocephalic and atraumatic. Eyes: No scleral icterus. Conjunctiva pink . Ears: Normal auditory acuity. Mouth: Dentition intact. No ulcers or lesions.  Lungs: Clear throughout to auscultation. Heart: Regular rate and rhythm, no murmur. Abdomen: Soft, nontender and nondistended. No masses or hepatomegaly. Normal bowel sounds x 4 quadrants.  Rectal: *** Musculoskeletal: Symmetrical with no gross deformities. Extremities: No edema. Neurological: Alert oriented x 4. No focal deficits.  Psychological: Alert and cooperative. Normal mood and affect  Assessment and Recommendations: *** I

## 2023-04-09 ENCOUNTER — Other Ambulatory Visit: Payer: Self-pay | Admitting: Student

## 2023-04-09 DIAGNOSIS — E785 Hyperlipidemia, unspecified: Secondary | ICD-10-CM

## 2023-04-12 ENCOUNTER — Other Ambulatory Visit: Payer: Self-pay | Admitting: Family Medicine

## 2023-04-12 DIAGNOSIS — M5416 Radiculopathy, lumbar region: Secondary | ICD-10-CM

## 2023-04-12 NOTE — Progress Notes (Signed)
Marland Kitchen  POV

## 2023-04-14 ENCOUNTER — Encounter: Payer: Self-pay | Admitting: Family Medicine

## 2023-04-15 NOTE — Discharge Instructions (Addendum)
Post Procedure Spinal Discharge Instruction Sheet  You may resume a regular diet and any medications that you routinely take (including pain medications) unless otherwise noted by MD.  No driving day of procedure.  Light activity throughout the rest of the day.  Do not do any strenuous work, exercise, bending or lifting.  The day following the procedure, you can resume normal physical activity but you should refrain from exercising or physical therapy for at least three days thereafter.  You may apply ice to the injection site, 20 minutes on, 20 minutes off, as needed. Do not apply ice directly to skin.    Common Side Effects:  Headaches- take your usual medications as directed by your physician.  Increase your fluid intake.  Caffeinated beverages may be helpful.  Lie flat in bed until your headache resolves.  Restlessness or inability to sleep- you may have trouble sleeping for the next few days.  Ask your referring physician if you need any medication for sleep.  Facial flushing or redness- should subside within a few days.  Increased pain- a temporary increase in pain a day or two following your procedure is not unusual.  Take your pain medication as prescribed by your referring physician.  Leg cramps  Please contact our office at [phone] for the following symptoms: Fever greater than 100 degrees. Headaches unresolved with medication after 2-3 days. Increased swelling, pain, or redness at injection site.   Thank you for visiting [facility] today.   YOU MAY RESUME ASPIRIN CONTAINING PRODUCTS (BC POWDER) TODAY.

## 2023-04-18 ENCOUNTER — Inpatient Hospital Stay
Admission: RE | Admit: 2023-04-18 | Discharge: 2023-04-18 | Disposition: A | Discharge status: Home / Self Care | Payer: MEDICAID | Source: Ambulatory Visit | Attending: Family Medicine | Admitting: Family Medicine

## 2023-04-22 NOTE — Discharge Instructions (Addendum)
 Post Procedure Spinal Discharge Instruction Sheet  You may resume a regular diet and any medications that you routinely take (including pain medications) unless otherwise noted by MD.  No driving day of procedure.  Light activity throughout the rest of the day.  Do not do any strenuous work, exercise, bending or lifting.  The day following the procedure, you can resume normal physical activity but you should refrain from exercising or physical therapy for at least three days thereafter.  You may apply ice to the injection site, 20 minutes on, 20 minutes off, as needed. Do not apply ice directly to skin.    Common Side Effects:  Headaches- take your usual medications as directed by your physician.  Increase your fluid intake.  Caffeinated beverages may be helpful.  Lie flat in bed until your headache resolves.  Restlessness or inability to sleep- you may have trouble sleeping for the next few days.  Ask your referring physician if you need any medication for sleep.  Facial flushing or redness- should subside within a few days.  Increased pain- a temporary increase in pain a day or two following your procedure is not unusual.  Take your pain medication as prescribed by your referring physician.  Leg cramps  Please contact our office at [phone] for the following symptoms: Fever greater than 100 degrees. Headaches unresolved with medication after 2-3 days. Increased swelling, pain, or redness at injection site.  MAY RESUME BD POWDER POST-PROCEDURE  Thank you for visiting [facility] today.

## 2023-04-25 ENCOUNTER — Ambulatory Visit
Admission: RE | Admit: 2023-04-25 | Discharge: 2023-04-25 | Disposition: A | Discharge status: Home / Self Care | Payer: MEDICAID | Source: Ambulatory Visit | Attending: Family Medicine | Admitting: Family Medicine

## 2023-04-25 DIAGNOSIS — M5416 Radiculopathy, lumbar region: Secondary | ICD-10-CM

## 2023-04-25 MED ORDER — IOPAMIDOL (ISOVUE-M 200) INJECTION 41%
1.0000 mL | Freq: Once | INTRAMUSCULAR | Status: AC
  Administered 2023-04-25: 1 mL via EPIDURAL

## 2023-04-25 MED ORDER — METHYLPREDNISOLONE ACETATE 40 MG/ML INJ SUSP (RADIOLOG
80.0000 mg | Freq: Once | INTRAMUSCULAR | Status: AC
  Administered 2023-04-25: 80 mg via EPIDURAL

## 2023-05-03 ENCOUNTER — Other Ambulatory Visit: Payer: Self-pay | Admitting: Family Medicine

## 2023-05-03 ENCOUNTER — Ambulatory Visit: Payer: MEDICAID | Admitting: Gastroenterology

## 2023-05-03 DIAGNOSIS — Z1231 Encounter for screening mammogram for malignant neoplasm of breast: Secondary | ICD-10-CM

## 2023-05-07 ENCOUNTER — Other Ambulatory Visit: Payer: Self-pay | Admitting: Student

## 2023-05-07 DIAGNOSIS — E785 Hyperlipidemia, unspecified: Secondary | ICD-10-CM

## 2023-05-09 ENCOUNTER — Other Ambulatory Visit: Payer: Self-pay

## 2023-05-09 DIAGNOSIS — M79651 Pain in right thigh: Secondary | ICD-10-CM

## 2023-05-09 DIAGNOSIS — M5416 Radiculopathy, lumbar region: Secondary | ICD-10-CM

## 2023-05-09 NOTE — Progress Notes (Signed)
 Pt had ESI x 2 weeks and is still having leg pain. She called over to [facility] and they suggested to call us for another ESI order.  Per Dr. Pearletha Forge - Ok to repeat [facility] but if this doesn't help enough I'd recommend she consult with neurosurgery as discussed at last office visit.

## 2023-05-10 ENCOUNTER — Other Ambulatory Visit: Payer: Self-pay | Admitting: Family Medicine

## 2023-05-10 DIAGNOSIS — M79651 Pain in right thigh: Secondary | ICD-10-CM

## 2023-05-10 DIAGNOSIS — M48061 Spinal stenosis, lumbar region without neurogenic claudication: Secondary | ICD-10-CM

## 2023-05-10 DIAGNOSIS — M5416 Radiculopathy, lumbar region: Secondary | ICD-10-CM

## 2023-05-16 ENCOUNTER — Encounter: Payer: Self-pay | Admitting: Family Medicine

## 2023-05-17 ENCOUNTER — Telehealth (INDEPENDENT_AMBULATORY_CARE_PROVIDER_SITE_OTHER): Payer: Self-pay | Admitting: Primary Care

## 2023-05-17 NOTE — Telephone Encounter (Signed)
 Spoke to pt about appt.. Will be present

## 2023-05-18 NOTE — Discharge Instructions (Signed)

## 2023-05-19 ENCOUNTER — Inpatient Hospital Stay
Admission: RE | Admit: 2023-05-19 | Discharge: 2023-05-19 | Disposition: A | Discharge status: Home / Self Care | Payer: MEDICAID | Source: Ambulatory Visit | Attending: Family Medicine | Admitting: Family Medicine

## 2023-05-24 ENCOUNTER — Ambulatory Visit (INDEPENDENT_AMBULATORY_CARE_PROVIDER_SITE_OTHER): Payer: MEDICAID | Admitting: Primary Care

## 2023-05-26 ENCOUNTER — Ambulatory Visit (INDEPENDENT_AMBULATORY_CARE_PROVIDER_SITE_OTHER): Payer: MEDICAID | Admitting: Family Medicine

## 2023-05-26 ENCOUNTER — Other Ambulatory Visit: Payer: Self-pay | Admitting: Student

## 2023-05-26 ENCOUNTER — Encounter: Payer: Self-pay | Admitting: Family Medicine

## 2023-05-26 ENCOUNTER — Telehealth: Payer: Self-pay | Admitting: Student

## 2023-05-26 VITALS — BP 130/80

## 2023-05-26 DIAGNOSIS — K5900 Constipation, unspecified: Secondary | ICD-10-CM

## 2023-05-26 DIAGNOSIS — M5416 Radiculopathy, lumbar region: Secondary | ICD-10-CM

## 2023-05-26 DIAGNOSIS — E785 Hyperlipidemia, unspecified: Secondary | ICD-10-CM

## 2023-05-26 MED ORDER — METHYLPREDNISOLONE ACETATE 40 MG/ML IJ SUSP
80.0000 mg | Freq: Once | INTRAMUSCULAR | Status: AC
  Administered 2023-05-26: 80 mg via INTRAMUSCULAR

## 2023-05-26 MED ORDER — KETOROLAC TROMETHAMINE 60 MG/2ML IM SOLN
60.0000 mg | Freq: Once | INTRAMUSCULAR | Status: AC
  Administered 2023-05-26: 60 mg via INTRAMUSCULAR

## 2023-05-26 MED ORDER — EZETIMIBE 10 MG PO TABS: ORAL_TABLET | ORAL | 0 refills | Status: DC

## 2023-05-26 MED ORDER — FAMOTIDINE 40 MG PO TABS: 40.0000 mg | ORAL_TABLET | Freq: Every day | ORAL | 0 refills | Status: DC

## 2023-05-26 MED ORDER — GABAPENTIN 100 MG PO CAPS: ORAL_CAPSULE | ORAL | 0 refills | Status: DC

## 2023-05-26 NOTE — Telephone Encounter (Signed)
Routed message to PCP. Tonya Mckenzie, CMA  

## 2023-05-26 NOTE — Patient Instructions (Signed)
 It was a pleasure seeing you today.  You received a Toradol and Steroid injection (depo-medrol) to help with you pain.  We are starting you on a medication called Gabapentin to help with pain.  The main side effect of this medication is that it can make you sleepy/tired, therefore you should only take at bedtime.  You will take it every night at bedtime. - You will take Gabapentin 100mg  at bedtime x 1 week, then increase to 300mg  at bedtime thereafter  You should plan to schedule your back injections after your new insurance is active.  You should follow-up with Dr Pearletha Forge in 4 weeks to see how you are doing with the Gabapentin.  If you have any questions or concerns, please do not hesitate to call the office at [phone] or send a MyChart message.  Have a great weekend!

## 2023-05-26 NOTE — Progress Notes (Addendum)
 DATE OF VISIT: 05/26/2023        Tonya Mckenzie DOB: 1960/09/05 MRN: [id number]  CC:  f/u back and thigh pain  History of present Illness: Tonya Mckenzie is a 63 y.o. female who presents for a follow-up visit for back and thigh pain Has been a chronic, longstanding issue for many years, first seen by us  for this 04/15/2021 Seeing us  about 3-4 times a year for this condition last several years, last visit with Dr. Peggy Bowens 04/04/2023 Symptoms continue to be relatively unchanged, continues to have low back pain radiating into the buttock and posterior leg stopping at the knee bilaterally She is in the process of getting scheduled for lumbar epidural injections, but she is in the process of changing her insurance and new insurance does not take effect until 05/31/2023.  Waiting to schedule until that time She has done relatively well with IM Toradol  and Depo-Medrol  injections in the past, last 04/04/2023 Dr. Peggy Bowens has discussed consideration of neuropathic agents in the past, but she was hesitant to proceed with these Dr. Peggy Bowens also discussed consideration of referral to neurosurgery, but patient was hesitant to proceed with this as well She denies any new injury or trauma Denies any changes in bowel or bladder Denies any lower extremity weakness Currently taking Tylenol , ibuprofen , BC powder without improvement  Medications:  Outpatient Encounter Medications as of 05/26/2023  Medication Sig   gabapentin  (NEURONTIN ) 100 MG capsule Take one tab at bedtime for 7 days then take 3 tabs at bedtime there after   amLODipine  (NORVASC ) 5 MG tablet TAKE 1 TABLET(5 MG) BY MOUTH AT BEDTIME   esomeprazole  (NEXIUM ) 40 MG capsule TAKE 1 CAPSULE(40 MG) BY MOUTH DAILY BEFORE BREAKFAST (Patient not taking: Reported on 09/06/2022)   ezetimibe  (ZETIA ) 10 MG tablet TAKE 1 TABLET(10 MG) BY MOUTH DAILY   famotidine  (PEPCID ) 40 MG tablet Take 1 tablet (40 mg total) by mouth daily.   fluticasone  (FLONASE ) 50 MCG/ACT nasal spray  Place 2 sprays into both nostrils daily. (Patient not taking: Reported on 06/02/2022)   polyethylene glycol powder (GLYCOLAX /MIRALAX ) 17 GM/SCOOP powder Take 17 g by mouth 2 (two) times daily as needed.   prazosin  (MINIPRESS ) 1 MG capsule TAKE 1 CAPSULE(1 MG) BY MOUTH AT BEDTIME   rosuvastatin  (CRESTOR ) 40 MG tablet TAKE 1 TABLET BY MOUTH EVERY DAY   senna (SENOKOT) 8.6 MG TABS tablet TAKE 1 TABLET(8.6 MG) BY MOUTH TWICE DAILY   [EXPIRED] ketorolac  (TORADOL ) injection 60 mg    [EXPIRED] methylPREDNISolone  acetate (DEPO-MEDROL ) injection 80 mg    No facility-administered encounter medications on file as of 05/26/2023.    Allergies: has no known allergies.  Physical Examination: Vitals: BP 130/80  GENERAL:  Tonya Mckenzie is a 63 y.o. female appearing their stated age, alert and oriented x 3, in no apparent distress.  MSK: Lumbar spine without any gross deformity.  No midline tenderness.  Mild bilateral paraspinal tenderness.  Negative SLR bilaterally.  Lower extremity strength 5/5 bilaterally.  Normal gait.  Good range of motion without pain. NEURO: sensation intact to light touch, DTR 2/4 lower extremity bilaterally VASC: no edema Assessment & Plan Lumbar radiculopathy Acute on chronic bilateral back pain radiating into both legs, ongoing for several years but acutely worsening. -Only transient benefit from prior facet injections -Has done relatively well with IM Toradol  and Depo-Medrol  injections in the past -In process of getting scheduled for epidural injections once her insurance changes 05/31/23  Plan: -Prior visit notes reviewed -Given IM Toradol   60 mg and IM Depo-Medrol  80 mg in the office today -Discussed treatment with neuropathic agents such as gabapentin  and nortriptyline.  She is interested in trial of gabapentin .  Risks and benefits discussed.  Rx gabapentin  100 mg 1 tab p.o. nightly x 1 week, then increase to 3 tabs p.o. nightly thereafter.  Did advise may cause drowsiness so  should only take at bedtime. -Should proceed with scheduling of her epidurals once her insurance changes -Follow-up with Dr. Merle Starcher in 4 weeks to assess her progress, sooner as needed   Patient expressed understanding & agreement with above.  Encounter Diagnosis  Name Primary?   Lumbar radiculopathy Yes    No orders of the defined types were placed in this encounter.  Addendum by Dr Peggy Bowens:  I had previously seen Ms. Weltman on 04/04/2023 as noted above in the history section.  Discussed with her on the phone today as well - her pain level is 10+/10 at worst.  She has been doing home exercise program (flexion based) for more than 6 weeks between 2/3 and 3/27 visits.  Has had a single epidural injection in past with some relief though discussed can take up to 3 injections.  Would like to repeat this injection - scheduled tomorrow but advised may not be able to get approval prior to this.  Will fax note with this addendum and 04/04/23 note to insurance.

## 2023-05-26 NOTE — Assessment & Plan Note (Signed)
 Acute on chronic bilateral back pain radiating into both legs, ongoing for several years but acutely worsening. -Only transient benefit from prior facet injections -Has done relatively well with IM Toradol and Depo-Medrol injections in the past -In process of getting scheduled for epidural injections once her insurance changes 05/31/23  Plan: -Prior visit notes reviewed -Given IM Toradol 60 mg and IM Depo-Medrol 80 mg in the office today -Discussed treatment with neuropathic agents such as gabapentin and nortriptyline.  She is interested in trial of gabapentin.  Risks and benefits discussed.  Rx gabapentin 100 mg 1 tab p.o. nightly x 1 week, then increase to 3 tabs p.o. nightly thereafter.  Did advise may cause drowsiness so should only take at bedtime. -Should proceed with scheduling of her epidurals once her insurance changes -Follow-up with Dr. Parks Neptune in 4 weeks to assess her progress, sooner as needed

## 2023-05-28 ENCOUNTER — Other Ambulatory Visit: Payer: Self-pay | Admitting: Family Medicine

## 2023-06-08 NOTE — Discharge Instructions (Signed)

## 2023-06-09 ENCOUNTER — Inpatient Hospital Stay
Admission: RE | Admit: 2023-06-09 | Discharge: 2023-06-09 | Disposition: A | Discharge status: Home / Self Care | Payer: Self-pay | Source: Ambulatory Visit | Attending: Family Medicine | Admitting: Family Medicine

## 2023-06-10 ENCOUNTER — Ambulatory Visit: Payer: MEDICAID | Admitting: Gastroenterology

## 2023-06-17 ENCOUNTER — Ambulatory Visit: Payer: MEDICAID

## 2023-06-18 ENCOUNTER — Other Ambulatory Visit: Payer: Self-pay | Admitting: Student

## 2023-06-18 DIAGNOSIS — F4310 Post-traumatic stress disorder, unspecified: Secondary | ICD-10-CM

## 2023-06-24 ENCOUNTER — Ambulatory Visit

## 2023-06-28 NOTE — Discharge Instructions (Signed)

## 2023-06-29 ENCOUNTER — Inpatient Hospital Stay
Admission: RE | Admit: 2023-06-29 | Discharge: 2023-06-29 | Disposition: A | Discharge status: Home / Self Care | Source: Ambulatory Visit | Attending: Family Medicine | Admitting: Family Medicine

## 2023-06-30 ENCOUNTER — Ambulatory Visit: Payer: MEDICAID | Admitting: Family Medicine

## 2023-07-01 ENCOUNTER — Ambulatory Visit
Admission: RE | Admit: 2023-07-01 | Discharge: 2023-07-01 | Disposition: A | Discharge status: Home / Self Care | Source: Ambulatory Visit | Attending: Family Medicine | Admitting: Family Medicine

## 2023-07-01 DIAGNOSIS — Z1231 Encounter for screening mammogram for malignant neoplasm of breast: Secondary | ICD-10-CM

## 2023-07-04 NOTE — Discharge Instructions (Signed)

## 2023-07-05 ENCOUNTER — Ambulatory Visit
Admission: RE | Admit: 2023-07-05 | Discharge: 2023-07-05 | Disposition: A | Discharge status: Home / Self Care | Source: Ambulatory Visit | Attending: Family Medicine | Admitting: Family Medicine

## 2023-07-05 DIAGNOSIS — M5416 Radiculopathy, lumbar region: Secondary | ICD-10-CM

## 2023-07-05 DIAGNOSIS — M79651 Pain in right thigh: Secondary | ICD-10-CM

## 2023-07-05 MED ORDER — METHYLPREDNISOLONE ACETATE 40 MG/ML INJ SUSP (RADIOLOG
80.0000 mg | Freq: Once | INTRAMUSCULAR | Status: AC
  Administered 2023-07-05: 80 mg via EPIDURAL

## 2023-07-05 MED ORDER — IOPAMIDOL (ISOVUE-M 200) INJECTION 41%
1.0000 mL | Freq: Once | INTRAMUSCULAR | Status: AC
Start: 2023-07-05 — End: 2023-07-05
  Administered 2023-07-05: 1 mL via EPIDURAL

## 2023-07-10 ENCOUNTER — Other Ambulatory Visit: Payer: Self-pay | Admitting: Student

## 2023-07-10 DIAGNOSIS — E785 Hyperlipidemia, unspecified: Secondary | ICD-10-CM

## 2023-07-11 ENCOUNTER — Ambulatory Visit: Payer: MEDICAID | Admitting: Gastroenterology

## 2023-07-11 NOTE — Progress Notes (Deleted)
 Chief Complaint:GERD, abdominal pain Primary GI Doctor:Dr. Dominic Friendly  HPI: 63 y.o. female  with a past medical history of hyperlipidemia, hypertension, marijuana use, cocaine use, PTSD with anxiety and depression, and diabetes. Last seen by Mylinda Asa, PA on 02/16/22 for constipation and epigastric abd pain with GERD. Patient returns to clinic today for evaluation of ***   GI history: Status post cholecystectomy and hysterectomy.   05/09/2014 colonoscopy Dr. Arvie Latus for screening purposes normal colonoscopy recall 10 years.  Excellent prep with Suprep. Recall 04/2024 04/2016 EGD for chronic epigastric burning with biopsies unremarkable 11/26/2021 office visit for reflux with benign EGD, discussed lifestyle changes, did trial of Nexium  and Pepcid .       Interval History  Patient admits/denies GERD Patient taking Esomeprazole  40mg  and Pepcid  40mg  Patient admits/denies dysphagia Patient admits/denies nausea, vomiting, or weight loss  Patient admits/denies altered bowel habits Patient admits/denies abdominal pain Patient admits/denies rectal bleeding   Denies/Admits alcohol Denies/Admits smoking Denies/Admits NSAID use. Denies/Admits they are on blood thinners.  Patients last colonoscopy Patients last EGD  Patient's family history includes  Wt Readings from Last 3 Encounters:  04/04/23 170 lb (77.1 kg)  02/02/23 179 lb 2 oz (81.3 kg)  12/09/22 176 lb 6.4 oz (80 kg)      Past Medical History:  Diagnosis Date   Allergic rhinitis    Anxiety    Bacterial vaginosis    Chronic post-traumatic stress disorder (PTSD)    Cocaine abuse (HCC)    Depression    ETOH abuse    GERD (gastroesophageal reflux disease)    Hyperlipidemia    Hypertension    off meds due to weight loss   Marijuana abuse    Uterine leiomyoma     Past Surgical History:  Procedure Laterality Date   ABDOMINAL HYSTERECTOMY     left ovaries, took cervix.  Performed for heavy periods and anemia   BICEPT  TENODESIS Left 03/06/2020   Procedure: BICEPS TENODESIS;  Surgeon: Micheline Ahr, MD;  Location: [facility];  Service: Orthopedics;  Laterality: Left;   CHOLECYSTECTOMY     SHOULDER ARTHROSCOPY WITH ROTATOR CUFF REPAIR AND SUBACROMIAL DECOMPRESSION Left 03/06/2020   Procedure: SHOULDER ARTHROSCOPY WITH ROTATOR CUFF REPAIR AND SUBACROMIAL DECOMPRESSION PARTIAL ACROMIOPLASTY AND DISTAL CLAVICULECTOMY;  Surgeon: Micheline Ahr, MD;  Location: [facility];  Service: Orthopedics;  Laterality: Left;   thumb surgery     right   TOE SURGERY Left    TONSILLECTOMY     TYMPANOSTOMY TUBE PLACEMENT      Current Outpatient Medications  Medication Sig Dispense Refill   amLODipine  (NORVASC ) 5 MG tablet TAKE 1 TABLET(5 MG) BY MOUTH AT BEDTIME 90 tablet 1   esomeprazole  (NEXIUM ) 40 MG capsule TAKE 1 CAPSULE(40 MG) BY MOUTH DAILY BEFORE BREAKFAST (Patient not taking: Reported on 09/06/2022) 30 capsule 0   ezetimibe  (ZETIA ) 10 MG tablet TAKE 1 TABLET(10 MG) BY MOUTH DAILY 90 tablet 0   famotidine  (PEPCID ) 40 MG tablet Take 1 tablet (40 mg total) by mouth daily. 90 tablet 0   fluticasone  (FLONASE ) 50 MCG/ACT nasal spray Place 2 sprays into both nostrils daily. (Patient not taking: Reported on 06/02/2022) 16 g 6   gabapentin  (NEURONTIN ) 100 MG capsule Take one tab at bedtime for 7 days then take 3 tabs at bedtime there after 70 capsule 0   polyethylene glycol powder (GLYCOLAX /MIRALAX ) 17 GM/SCOOP powder Take 17 g by mouth 2 (two) times daily as needed. 3350 g 1   prazosin  (MINIPRESS )  1 MG capsule TAKE 1 CAPSULE(1 MG) BY MOUTH AT BEDTIME 90 capsule 0   rosuvastatin  (CRESTOR ) 40 MG tablet TAKE 1 TABLET BY MOUTH EVERY DAY 90 tablet 1   senna (SENOKOT) 8.6 MG TABS tablet TAKE 1 TABLET(8.6 MG) BY MOUTH TWICE DAILY 120 tablet 0   No current facility-administered medications for this visit.    Allergies as of 07/11/2023   (No Known Allergies)    Family History  Problem Relation Age of Onset    Diabetes Mother    Hypertension Mother    Hypercholesterolemia Mother    Heart attack Father    Heart attack Brother    Colon cancer Neg Hx    Esophageal cancer Neg Hx    Rectal cancer Neg Hx    Stomach cancer Neg Hx    Liver cancer Neg Hx    Breast cancer Neg Hx     Review of Systems:    Constitutional: No weight loss, fever, chills, weakness or fatigue HEENT: Eyes: No change in vision               Ears, Nose, Throat:  No change in hearing or congestion Skin: No rash or itching Cardiovascular: No chest pain, chest pressure or palpitations   Respiratory: No SOB or cough Gastrointestinal: See HPI and otherwise negative Genitourinary: No dysuria or change in urinary frequency Neurological: No headache, dizziness or syncope Musculoskeletal: No new muscle or joint pain Hematologic: No bleeding or bruising Psychiatric: No history of depression or anxiety    Physical Exam:  Vital signs: There were no vitals taken for this visit.  Constitutional:   Pleasant *** female appears to be in NAD, Well developed, Well nourished, alert and cooperative Head:  Normocephalic and atraumatic. Eyes:   PEERL, EOMI. No icterus. Conjunctiva pink. Ears:  Normal auditory acuity. Neck:  Supple Throat: Oral cavity and pharynx without inflammation, swelling or lesion.  Respiratory: Respirations even and unlabored. Lungs clear to auscultation bilaterally.   No wheezes, crackles, or rhonchi.  Cardiovascular: Normal S1, S2. Regular rate and rhythm. No peripheral edema, cyanosis or pallor.  Gastrointestinal:  Soft, nondistended, nontender. No rebound or guarding. Normal bowel sounds. No appreciable masses or hepatomegaly. Rectal:  Not performed.  Anoscopy: Msk:  Symmetrical without gross deformities. Without edema, no deformity or joint abnormality.  Neurologic:  Alert and  oriented x4;  grossly normal neurologically.  Skin:   Dry and intact without significant lesions or rashes. Psychiatric:  Oriented to person, place and time. Demonstrates good judgement and reason without abnormal affect or behaviors.  RELEVANT LABS AND IMAGING: CBC    Latest Ref Rng & Units 02/16/2022   10:35 AM 08/21/2020   11:49 AM 07/08/2017    7:40 PM  CBC  WBC 4.0 - 10.5 K/uL 7.0  8.2  10.6   Hemoglobin 12.0 - 15.0 g/dL 16.1  09.6  04.5   Hematocrit 36.0 - 46.0 % 41.2  43.8  44.6   Platelets 150.0 - 400.0 K/uL 213.0  246  208      CMP     Latest Ref Rng & Units 05/26/2022    2:45 PM 03/09/2022   10:43 AM 02/16/2022   10:35 AM  CMP  Glucose 70 - 99 mg/dL 409  99  93   BUN 8 - 27 mg/dL 13  14  10    Creatinine 0.57 - 1.00 mg/dL 8.11  9.14  7.82   Sodium 134 - 144 mmol/L 141  144  143   Potassium  3.5 - 5.2 mmol/L 4.3  4.3  3.7   Chloride 96 - 106 mmol/L 105  105  108   CO2 20 - 29 mmol/L 24  23  28    Calcium  8.7 - 10.3 mg/dL 9.5  9.7  9.1   Total Protein 6.0 - 8.5 g/dL 6.6  6.3  6.3   Total Bilirubin 0.0 - 1.2 mg/dL 0.2  0.4  0.4   Alkaline Phos 44 - 121 IU/L 111  106  86   AST 0 - 40 IU/L 29  67  100   ALT 0 - 32 IU/L 54  141  171      Lab Results  Component Value Date   TSH 1.28 02/16/2022     Assessment: 1. ***  Plan: -Recall colonoscopy 04/2024   Thank you for the courtesy of this consult. Please call me with any questions or concerns.   Eileen Croswell, FNP-C [facility] 07/11/2023, 7:09 AM  Cc: Genora Kidd, MD

## 2023-07-15 ENCOUNTER — Ambulatory Visit (INDEPENDENT_AMBULATORY_CARE_PROVIDER_SITE_OTHER): Payer: MEDICAID | Admitting: Primary Care

## 2023-07-15 ENCOUNTER — Encounter (INDEPENDENT_AMBULATORY_CARE_PROVIDER_SITE_OTHER): Payer: Self-pay | Admitting: Primary Care

## 2023-07-15 ENCOUNTER — Telehealth (INDEPENDENT_AMBULATORY_CARE_PROVIDER_SITE_OTHER): Payer: Self-pay | Admitting: Primary Care

## 2023-07-15 VITALS — BP 127/86 | HR 71 | Resp 16 | Ht 66.0 in | Wt 171.4 lb

## 2023-07-15 DIAGNOSIS — F4310 Post-traumatic stress disorder, unspecified: Secondary | ICD-10-CM

## 2023-07-15 DIAGNOSIS — E782 Mixed hyperlipidemia: Secondary | ICD-10-CM | POA: Diagnosis not present

## 2023-07-15 DIAGNOSIS — I10 Essential (primary) hypertension: Secondary | ICD-10-CM | POA: Diagnosis not present

## 2023-07-15 DIAGNOSIS — Z6827 Body mass index (BMI) 27.0-27.9, adult: Secondary | ICD-10-CM | POA: Diagnosis not present

## 2023-07-15 DIAGNOSIS — Z131 Encounter for screening for diabetes mellitus: Secondary | ICD-10-CM

## 2023-07-15 DIAGNOSIS — E559 Vitamin D deficiency, unspecified: Secondary | ICD-10-CM | POA: Diagnosis not present

## 2023-07-15 DIAGNOSIS — E663 Overweight: Secondary | ICD-10-CM | POA: Diagnosis not present

## 2023-07-15 DIAGNOSIS — Z2821 Immunization not carried out because of patient refusal: Secondary | ICD-10-CM
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
## 2023-07-15 NOTE — Telephone Encounter (Signed)
 Copied from CRM [id number]. Topic: Clinical - Prescription Issue >> Jul 15, 2023  3:01 PM Tonya Mckenzie wrote: Reason for CRM: PT called in because was told during office visit today 05/16 that medication would be sent in to help her get a nights rest due to the night terrors, looked over pt chart do not see any medication changes from today's visit, pt does not remember the medication name. Please follow up with pt [phone]

## 2023-07-15 NOTE — Telephone Encounter (Signed)
 Will forward to provider

## 2023-07-16 ENCOUNTER — Other Ambulatory Visit (INDEPENDENT_AMBULATORY_CARE_PROVIDER_SITE_OTHER): Payer: Self-pay | Admitting: Primary Care

## 2023-07-16 MED ORDER — FLUOXETINE HCL 10 MG PO TABS: 10.0000 mg | ORAL_TABLET | Freq: Every day | ORAL | 0 refills | Status: DC

## 2023-07-18 ENCOUNTER — Telehealth: Payer: Self-pay

## 2023-07-18 NOTE — Telephone Encounter (Signed)
 Copied from CRM [id number]. Topic: Clinical - Medication Question >> Jul 18, 2023  2:26 PM Chrystal Crape R wrote: Pt stated Pharmacy has informed her that her FLUoxetine  (PROZAC ) 10 MG tablet has to be in the form of a capsule for insurance to pay. That order can sent in to the same pharmacy [id number] - Jonette Nestle, [place] - [address] AT [facility] OF [address], [place] [address] Phone: [phone]  Fax: [phone]

## 2023-07-19 ENCOUNTER — Other Ambulatory Visit (INDEPENDENT_AMBULATORY_CARE_PROVIDER_SITE_OTHER): Payer: Self-pay

## 2023-07-19 MED ORDER — FLUOXETINE HCL 10 MG PO CAPS: 10.0000 mg | ORAL_CAPSULE | Freq: Every day | ORAL | 0 refills | Status: DC

## 2023-07-19 NOTE — Telephone Encounter (Signed)
 This has been taken care of.

## 2023-07-19 NOTE — Telephone Encounter (Signed)
 Rx has been sent

## 2023-07-19 NOTE — Telephone Encounter (Signed)
Pt called back to check status of request

## 2023-07-21 ENCOUNTER — Telehealth: Payer: Self-pay | Admitting: *Deleted

## 2023-07-21 NOTE — Progress Notes (Signed)
 Complex Care Management Note Care Guide Note  07/21/2023 Name: Tonya Mckenzie MRN: [id number] DOB: Jul 25, 1960   Complex Care Management Outreach Attempts: An unsuccessful telephone outreach was attempted today to offer the patient information about available complex care management services.  Follow Up Plan:  Additional outreach attempts will be made to offer the patient complex care management information and services.   Encounter Outcome:  No Answer  Barnie Bora  [facility]  [facility], [facility] Guide  Direct Dial: [phone]  Fax [phone]

## 2023-07-22 ENCOUNTER — Encounter (INDEPENDENT_AMBULATORY_CARE_PROVIDER_SITE_OTHER): Payer: Self-pay | Admitting: Primary Care

## 2023-07-22 NOTE — Progress Notes (Signed)
 Complex Care Management Note  Care Guide Note 07/22/2023 Name: Tonya Mckenzie MRN: [id number] DOB: May 01, 1960  Tonya Mckenzie is a 63 y.o. year old female who sees Marius Siemens, NP for primary care. I reached out to Haydee Lipa by phone today to offer complex care management services.  Ms. Ofallon was given information about Complex Care Management services today including:   The Complex Care Management services include support from the care team which includes your Nurse Care Manager, Clinical Social Worker, or Pharmacist.  The Complex Care Management team is here to help remove barriers to the health concerns and goals most important to you. Complex Care Management services are voluntary, and the patient may decline or stop services at any time by request to their care team member.   Complex Care Management Consent Status: Patient agreed to services and verbal consent obtained.   Follow up plan:  Telephone appointment with complex care management team member scheduled for:  08/10/23  Encounter Outcome:  Patient Scheduled  Barnie Bora  [facility]  [facility], [facility] Guide  Direct Dial: [phone]  Fax [phone]

## 2023-07-26 ENCOUNTER — Telehealth (INDEPENDENT_AMBULATORY_CARE_PROVIDER_SITE_OTHER): Payer: Self-pay

## 2023-07-26 NOTE — Telephone Encounter (Unsigned)
 Copied from CRM [id number]. Topic: General - Other >> Jul 26, 2023 11:04 AM Emylou G wrote: Reason for CRM: FLUoxetine  (PROZAC ) 10 MG capsule is keeping her awake said she found a few Trazodone  and it is working.. Can she get this instead?

## 2023-08-02 ENCOUNTER — Ambulatory Visit: Admitting: Gastroenterology

## 2023-08-10 ENCOUNTER — Other Ambulatory Visit: Payer: Self-pay | Admitting: Licensed Clinical Social Worker

## 2023-08-10 DIAGNOSIS — F332 Major depressive disorder, recurrent severe without psychotic features: Secondary | ICD-10-CM

## 2023-08-10 DIAGNOSIS — F4310 Post-traumatic stress disorder, unspecified: Secondary | ICD-10-CM

## 2023-08-10 NOTE — Addendum Note (Signed)
 Addended by: Elie Grove on: 08/10/2023 11:51 AM   Modules accepted: Orders

## 2023-08-10 NOTE — Patient Instructions (Signed)
 Visit Information  Tonya Mckenzie was given information about Medicaid Managed Care team care coordination services as a part of their [facility] Medicaid benefit. Tonya Mckenzie verbally consented to engagement with the [facility] Managed Care team.   If you are experiencing a medical emergency, please call 911 or report to your local emergency department or urgent care.   If you have a non-emergency medical problem during routine business hours, please contact your provider's office and ask to speak with a nurse.   For questions related to your [place] health plan, please call: [phone] or go here:[email or web address]  If you would like to schedule transportation through your [facility] plan, please call the following number at least 2 days in advance of your appointment: [phone].   You can also use the MTM portal or MTM mobile app to manage your rides. Reimbursement for transportation is available through [facility]! For the portal, please go to [email or web address]  Call the [facility] Crisis Line at [phone], at any time, 24 hours a day, 7 days a week. If you are in danger or need immediate medical attention call 911.  If you would like help to quit smoking, call 1-800-QUIT-NOW (347-639-8740) OR Espaol: 1-855-Djelo-Ya (8-756-433-2951) o para ms informacin haga clic aqu or Text READY to 884-166 to register via text  Tonya Mckenzie - following are the goals we discussed in your visit today:   Goals Addressed             This Visit's Progress    LCSW [facility] Social Work Care Plan       Problems:   Limited access to food, Limited social support, and Mental Health Concerns  and Stress  CSW Clinical Goal(s):   Over the next 90 days the Patient will attend all scheduled medical appointments as evidenced by patient report and care team review of appointment completion in electronic MEDICAL RECORD NUMBERby VBCI LCSW demonstrate a reduction in symptoms related to  Insomnia/Sleep Difficulties, Mood Instability, PTSD. Pt will follow up with [facility] as directed by Social Work.  Interventions:  Mental Health:  Evaluation of current treatment plan related to Insomnia/Sleep Difficulties and PTSD Active listening / Reflection utilized Behavioral Activation reviewed Crisis Resource Education / information provided Depression screen reviewed Discussed referral for psychiatry: [facility] referral completed for psychiatry on 08/10/23 Discussed referral options to connect for ongoing therapy: [facility] referral completed for counseling on 08/10/23 Emotional Support Provided Made referral to [facility] for PTSD and Insomnia symptom management Motivational Interviewing employed PHQ2/PHQ9 completed Problem Solving /Task Center strategies reviewed Provided general psycho-education for mental health needs Quality of sleep assessed & Sleep Hygiene techniques promoted Reviewed mental health medications and discussed importance of compliance: Patient taking prozac  for symptom relief and reports that it is effectively working for her at this time. Suicidal Ideation/Homicidal Ideation assessed: No SI/HI reported  Patient Goals/Self-Care Activities:  Call [facility] to schedule your counseling appointment. Connect with provider for ongoing mental health treatment.   Continue taking your medication as prescribed.   Increase coping skills, healthy habits, self-management skills, and stress reduction  Plan:   The care management team will reach out to the patient again over the next 30 days.        Please see education materials related to mental health and community health resources provided as Financial risk analyst.   Patient verbalizes understanding of instructions and care plan provided today and agrees to view in MyChart. Active MyChart status and patient understanding of how to access instructions and care plan  via MyChart confirmed with patient.     Licensed Clinical Social Worker  will contact you on 08/25/23 at 9:30 am  Tonya Mckenzie, BSW, MSW, [employer] Licensed Clinical Social Worker [employer]   [email or web address] [email or web address] Direct Dial: [phone]

## 2023-08-12 ENCOUNTER — Ambulatory Visit (INDEPENDENT_AMBULATORY_CARE_PROVIDER_SITE_OTHER)

## 2023-08-12 DIAGNOSIS — I10 Essential (primary) hypertension: Secondary | ICD-10-CM

## 2023-08-12 DIAGNOSIS — R7309 Other abnormal glucose: Secondary | ICD-10-CM

## 2023-08-12 DIAGNOSIS — E782 Mixed hyperlipidemia: Secondary | ICD-10-CM

## 2023-08-12 NOTE — Progress Notes (Signed)
 Pt came into the office to get blood work from 07/15/23

## 2023-08-13 LAB — CBC WITH DIFFERENTIAL/PLATELET
Basophils Absolute: 0 10*3/uL (ref 0.0–0.2)
Basos: 0 %
EOS (ABSOLUTE): 0.1 10*3/uL (ref 0.0–0.4)
Eos: 1 %
Hematocrit: 43.1 % (ref 34.0–46.6)
Hemoglobin: 13.6 g/dL (ref 11.1–15.9)
Immature Grans (Abs): 0 10*3/uL (ref 0.0–0.1)
Immature Granulocytes: 0 %
Lymphocytes Absolute: 1.5 10*3/uL (ref 0.7–3.1)
Lymphs: 20 %
MCH: 29.2 pg (ref 26.6–33.0)
MCHC: 31.6 g/dL (ref 31.5–35.7)
MCV: 93 fL (ref 79–97)
Monocytes Absolute: 0.6 10*3/uL (ref 0.1–0.9)
Monocytes: 7 %
Neutrophils Absolute: 5.4 10*3/uL (ref 1.4–7.0)
Neutrophils: 72 %
Platelets: 226 10*3/uL (ref 150–450)
RBC: 4.65 x10E6/uL (ref 3.77–5.28)
RDW: 12 % (ref 11.7–15.4)
WBC: 7.6 10*3/uL (ref 3.4–10.8)

## 2023-08-13 LAB — CMP14+EGFR
ALT: 132 IU/L — ABNORMAL HIGH (ref 0–32)
AST: 70 IU/L — ABNORMAL HIGH (ref 0–40)
Albumin: 4.4 g/dL (ref 3.9–4.9)
Alkaline Phosphatase: 109 IU/L (ref 44–121)
BUN/Creatinine Ratio: 15 (ref 12–28)
BUN: 13 mg/dL (ref 8–27)
Bilirubin Total: 0.2 mg/dL (ref 0.0–1.2)
CO2: 22 mmol/L (ref 20–29)
Calcium: 9.3 mg/dL (ref 8.7–10.3)
Chloride: 106 mmol/L (ref 96–106)
Creatinine, Ser: 0.89 mg/dL (ref 0.57–1.00)
Globulin, Total: 2.2 g/dL (ref 1.5–4.5)
Glucose: 104 mg/dL — ABNORMAL HIGH (ref 70–99)
Potassium: 4.8 mmol/L (ref 3.5–5.2)
Sodium: 142 mmol/L (ref 134–144)
Total Protein: 6.6 g/dL (ref 6.0–8.5)
eGFR: 74 mL/min/{1.73_m2} (ref >59)

## 2023-08-13 LAB — LIPID PANEL
Chol/HDL Ratio: 2.3 ratio (ref 0.0–4.4)
Cholesterol, Total: 157 mg/dL (ref 100–199)
HDL: 67 mg/dL (ref >39)
LDL Chol Calc (NIH): 79 mg/dL (ref 0–99)
Triglycerides: 55 mg/dL (ref 0–149)
VLDL Cholesterol Cal: 11 mg/dL (ref 5–40)

## 2023-08-13 LAB — HEMOGLOBIN A1C
Est. average glucose Bld gHb Est-mCnc: 137 mg/dL
Hgb A1c MFr Bld: 6.4 % — ABNORMAL HIGH (ref 4.8–5.6)

## 2023-08-15 ENCOUNTER — Telehealth (INDEPENDENT_AMBULATORY_CARE_PROVIDER_SITE_OTHER): Payer: Self-pay | Admitting: Primary Care

## 2023-08-15 ENCOUNTER — Ambulatory Visit (INDEPENDENT_AMBULATORY_CARE_PROVIDER_SITE_OTHER): Admitting: Mental Health

## 2023-08-15 DIAGNOSIS — F4310 Post-traumatic stress disorder, unspecified: Secondary | ICD-10-CM | POA: Diagnosis not present

## 2023-08-15 NOTE — Telephone Encounter (Signed)
 Error

## 2023-08-15 NOTE — Telephone Encounter (Signed)
 Will forward to provider
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
## 2023-08-15 NOTE — Telephone Encounter (Signed)
 Per pt is calling in reference to 07/26/23 encounter notes. Pt is confused on wether provider wants pt to schedule an appt for (Trazodone ) or if provider is going to refill prescription. Pt would like a follow up with her from nurse or provider because the E2C2 nurse is also confused from encounter notes from 5/27. Nurse is wanting Provider or CMA to follow up with clear instructions.

## 2023-08-16 ENCOUNTER — Other Ambulatory Visit (INDEPENDENT_AMBULATORY_CARE_PROVIDER_SITE_OTHER): Payer: Self-pay | Admitting: Primary Care

## 2023-08-16 DIAGNOSIS — F4310 Post-traumatic stress disorder, unspecified: Secondary | ICD-10-CM

## 2023-08-17 ENCOUNTER — Other Ambulatory Visit (INDEPENDENT_AMBULATORY_CARE_PROVIDER_SITE_OTHER): Payer: Self-pay | Admitting: Primary Care

## 2023-08-18 ENCOUNTER — Other Ambulatory Visit: Payer: Self-pay | Admitting: Student

## 2023-08-19 ENCOUNTER — Ambulatory Visit (INDEPENDENT_AMBULATORY_CARE_PROVIDER_SITE_OTHER): Payer: Self-pay | Admitting: Primary Care

## 2023-08-24 ENCOUNTER — Telehealth (INDEPENDENT_AMBULATORY_CARE_PROVIDER_SITE_OTHER): Payer: Self-pay | Admitting: Primary Care

## 2023-08-24 ENCOUNTER — Ambulatory Visit: Admitting: Gastroenterology

## 2023-08-24 NOTE — Telephone Encounter (Unsigned)
 Copied from CRM [id number]. Topic: Clinical - Medication Refill >> Aug 24, 2023 11:43 AM Ivette P wrote: Medication: famotidine  (PEPCID ) 40 MG tablet  Has the patient contacted their pharmacy? Yes (Agent: If no, request that the patient contact the pharmacy for the refill. If patient does not wish to contact the pharmacy document the reason why and proceed with request.) (Agent: If yes, when and what did the pharmacy advise?)  This is the patient's preferred pharmacy:  [id number] - [place], [place] - [address] AT [facility] OF [address]  [place] [address] Phone: [phone] Fax: [phone]   Is this the correct pharmacy for this prescription? Yes If no, delete pharmacy and type the correct one.   Has the prescription been filled recently? No, 05/26/2023  Is the patient out of the medication? Yes  Has the patient been seen for an appointment in the last year OR does the patient have an upcoming appointment? Yes  Can we respond through MyChart? Yes  Agent: Please be advised that Rx refills may take up to 3 business days. We ask that you follow-up with your pharmacy.

## 2023-08-25 ENCOUNTER — Other Ambulatory Visit: Payer: Self-pay | Admitting: Licensed Clinical Social Worker

## 2023-08-25 NOTE — Patient Outreach (Addendum)
 Complex Care Management   Visit Note  08/25/2023  Name:  Tonya Mckenzie MRN: [id number] DOB: [birth year]  Situation: Referral received for Complex Care Management related to Mental/Behavioral Health diagnosis PTSD I obtained verbal consent from Patient.  Visit completed with VBCI LCSW  on the phone  Background:   Past Medical History:  Diagnosis Date   Allergic rhinitis    Anxiety    Bacterial vaginosis    Chronic post-traumatic stress disorder (PTSD)    Cocaine abuse (HCC)    Depression    ETOH abuse    GERD (gastroesophageal reflux disease)    Hyperlipidemia    Hypertension    off meds due to weight loss   Marijuana abuse    Uterine leiomyoma    Patient Reported Symptoms:  Cognitive Cognitive Status: Able to follow simple commands, Alert and oriented to person, place, and time, Insightful and able to interpret abstract concepts, Normal speech and language skills Cognitive/Intellectual Conditions Management [RPT]: None reported or documented in medical history or problem list      Neurological Neurological Review of Symptoms: No symptoms reported    HEENT HEENT Symptoms Reported: No symptoms reported      Cardiovascular Cardiovascular Symptoms Reported: No symptoms reported    Respiratory Respiratory Symptoms Reported: No symptoms reported    Endocrine Patient reports the following symptoms related to hypoglycemia or hyperglycemia : No symptoms reported    Gastrointestinal Gastrointestinal Symptoms Reported: No symptoms reported      Genitourinary Genitourinary Symptoms Reported: No symptoms reported    Integumentary Integumentary Symptoms Reported: No symptoms reported    Musculoskeletal Musculoskelatal Symptoms Reviewed: Muscle pain, Weakness Other Musculoskeletal Symptoms: Pain in back ogf legs still but recent injection is helping with pain thankfully Musculoskeletal Conditions: Mobility limited, Back pain Musculoskeletal Management Strategies: Activity, Adequate  rest Musculoskeletal Self-Management Outcome: 2 (bad) Falls in the past year?: No Number of falls in past year: 1 or less Was there an injury with Fall?: No Fall Risk Category Calculator: 0 Patient Fall Risk Level: Low Fall Risk    Psychosocial Psychosocial Symptoms Reported: Anxiety - if selected complete GAD, Depression - if selected complete PHQ 2-9, Sadness - if selected complete PHQ 2-9 Additional Psychological Details: Pt is having ongoing night terrors and PTSD symptoms due to past trauma. She reports sleep walking last night again. She is successfully scheduled now for both therapy and psychiatry at [facility] and is agreeable to walk to this physical location (it is near her residence) and go in as walk in patient. Crisis support provided. No SI/HI reported. Behavioral Health Conditions: Anxiety, Depression, Post traumatic stress Behavioral Management Strategies: Medication therapy, Coping strategies Behavioral Health Self-Management Outcome: 2 (bad) Major Change/Loss/Stressor/Fears (CP): Traumatic event Techniques to Cope with Loss/Stress/Change: Medication        08/25/2023    9:58 AM  Depression screen PHQ 2/9  Decreased Interest 1  Down, Depressed, Hopeless 1  PHQ - 2 Score 2  Altered sleeping 3  Tired, decreased energy 3  Change in appetite 1  Feeling bad or failure about yourself  0  Trouble concentrating 0  Moving slowly or fidgety/restless 0  Suicidal thoughts 0  PHQ-9 Score 9  Difficult doing work/chores Very difficult      08/25/2023   10:02 AM 08/10/2023   11:26 AM 07/15/2023    9:13 AM 05/15/2019    4:14 PM  GAD 7 : Generalized Anxiety Score  Nervous, Anxious, on Edge 1 1 2  0  Control/stop worrying 1 1  2 0  Worry too much - different things 2 0  0  Trouble relaxing 2 1  0  Restless 2 2  0  Easily annoyed or irritable 2 0  0  Afraid - awful might happen 2 0  0  Total GAD 7 Score 12 5  0  Anxiety Difficulty Very difficult Somewhat difficult Somewhat difficult  Not difficult at all    SDOH Screenings   Food Insecurity: Food Insecurity Present (08/25/2023)  Housing: Low Risk  (08/10/2023)  Transportation Needs: No Transportation Needs (08/10/2023)  Utilities: Not At Risk (08/10/2023)  Alcohol Screen: Low Risk  (08/10/2023)  Depression (PHQ2-9): Medium Risk (08/25/2023)  Financial Resource Strain: Medium Risk (08/10/2023)  Social Connections: Unknown (08/10/2023)  Stress: Stress Concern Present (08/25/2023)  Tobacco Use: Medium Risk (07/22/2023)   There were no vitals filed for this visit.  Medications Reviewed Today     Reviewed by Merlynn Lyle CROME, LCSW (Social Worker) on 08/25/23 at [phone]  Med List Status: <None>   Medication Order Taking? Sig Documenting Provider Last Dose Status Informant  amLODipine  (NORVASC ) 5 MG tablet [id number] No TAKE 1 TABLET(5 MG) BY MOUTH AT BEDTIME Jagadish, Mayuri, MD Taking Active   ezetimibe  (ZETIA ) 10 MG tablet [id number] No TAKE 1 TABLET(10 MG) BY MOUTH DAILY Jagadish, Mayuri, MD Taking Active   famotidine  (PEPCID ) 40 MG tablet [id number] No Take 1 tablet (40 mg total) by mouth daily. Christia Budds, MD Taking Active   FLUoxetine  (PROZAC ) 10 MG capsule [id number]  Take 1 capsule (10 mg total) by mouth daily. Celestia Rosaline SQUIBB, NP  Active   rosuvastatin  (CRESTOR ) 40 MG tablet [id number] No TAKE 1 TABLET BY MOUTH EVERY DAY Christia Budds, MD Taking Active             Recommendation:   PCP Follow-up Continue Current Plan of Care  Follow Up Plan:   Telephone follow-up in 1 month  Lyle Merlynn, BSW, MSW, LCSW Licensed Clinical Social Worker [employer]   [facility] [email or web address] Direct Dial: [phone]

## 2023-08-25 NOTE — Telephone Encounter (Signed)
 Requested medications are due for refill today.  yes  Requested medications are on the active medications list.  yes  Last refill. 05/26/2023 #90 0 rf  Future visit scheduled.   yes  Notes to clinic.  Rx signed by another provider.     Requested Prescriptions  Pending Prescriptions Disp Refills   famotidine  (PEPCID ) 40 MG tablet 90 tablet 0    Sig: Take 1 tablet (40 mg total) by mouth daily.     Gastroenterology:  H2 Antagonists Passed - 08/25/2023  3:19 PM      Passed - Valid encounter within last 12 months    Recent Outpatient Visits           1 month ago Herpes zoster vaccination declined   [facility] Celestia Rosaline SQUIBB, NP

## 2023-08-25 NOTE — Patient Instructions (Addendum)
 Visit Information  Thank you for taking time to visit with me today. Please don't hesitate to contact me if I can be of assistance to you before our next scheduled appointment.  Your next care management appointment is by telephone on 09/12/23 at 930 AM  Please call the care guide team at [phone] if you need to cancel, schedule, or reschedule an appointment.   Please call the Suicide and Crisis Lifeline: 988 go to [facility] [address] [place] [phone]) if you are experiencing a Mental Health or Behavioral Health Crisis or need someone to talk to.   Lyle Rung, BSW, MSW, LCSW Licensed Clinical Social Worker [employer]   [facility] [email or web address] Direct Dial: [phone]

## 2023-09-12 ENCOUNTER — Telehealth: Admitting: Licensed Clinical Social Worker

## 2023-09-13 ENCOUNTER — Ambulatory Visit: Admitting: Gastroenterology

## 2023-09-14 ENCOUNTER — Other Ambulatory Visit: Payer: Self-pay | Admitting: Licensed Clinical Social Worker

## 2023-09-14 ENCOUNTER — Encounter: Payer: Self-pay | Admitting: Licensed Clinical Social Worker

## 2023-09-14 NOTE — Patient Instructions (Signed)
 Visit Information  Thank you for taking time to visit with me today. Please don't hesitate to contact me if I can be of assistance to you before our next scheduled appointment.  Your next care management appointment is by telephone on 09/30/23 at 1030 am  Please call the care guide team at [phone] if you need to cancel, schedule, or reschedule an appointment.   Please call the Suicide and Crisis Lifeline: 988 call the [place]  National Suicide Prevention Lifeline: 2721632566 or TTY: 952-517-2916 TTY (660)151-2368) to talk to a trained counselor go to [facility] [address], [place] [phone]) if you are experiencing a Mental Health or Behavioral Health Crisis or need someone to talk to.  Lyle Rung, BSW, MSW, LCSW Licensed Clinical Social Worker [employer]   [email or web address] Direct Dial: [phone]

## 2023-09-14 NOTE — Patient Outreach (Signed)
 Complex Care Management   Visit Note  09/14/2023  Name:  Tonya Mckenzie MRN: [id number] DOB: 03-24-1960  Situation: Referral received for Complex Care Management related to SDOH needs. I obtained verbal consent from Patient.  Visit completed with VBCI LCSW  on the phone  Background:   Past Medical History:  Diagnosis Date   Allergic rhinitis    Anxiety    Bacterial vaginosis    Chronic post-traumatic stress disorder (PTSD)    Cocaine abuse (HCC)    Depression    ETOH abuse    GERD (gastroesophageal reflux disease)    Hyperlipidemia    Hypertension    off meds due to weight loss   Marijuana abuse    Uterine leiomyoma     Assessment: Patient Reported Symptoms:  Cognitive Cognitive Status: Able to follow simple commands, Alert and oriented to person, place, and time, Insightful and able to interpret abstract concepts, Normal speech and language skills Cognitive/Intellectual Conditions Management [RPT]: None reported or documented in medical history or problem list   Health Maintenance Behaviors: Annual physical exam Healing Pattern: Average Health Facilitated by: Stress management, Rest  Neurological Neurological Review of Symptoms: No symptoms reported    HEENT HEENT Symptoms Reported: No symptoms reported      Cardiovascular Cardiovascular Symptoms Reported: No symptoms reported Does patient have uncontrolled Hypertension?: Yes (Hypertension dx but pt reports this is being managed well)    Respiratory Respiratory Symptoms Reported: No symptoms reported    Endocrine Endocrine Symptoms Reported: No symptoms reported Is patient diabetic?: No    Gastrointestinal Gastrointestinal Symptoms Reported: No symptoms reported      Genitourinary Genitourinary Symptoms Reported: No symptoms reported    Integumentary Integumentary Symptoms Reported: No symptoms reported    Musculoskeletal Musculoskelatal Symptoms Reviewed: Back pain, Muscle pain, Weakness Other Musculoskeletal  Symptoms: Pain in the back of her legs but recent injections seems to be helping some. Additional Musculoskeletal Details: Pain management coping skill education provided Musculoskeletal Management Strategies: Adequate rest, Activity, Coping strategies, Routine screening, Medication therapy Musculoskeletal Self-Management Outcome: 2 (bad) Falls in the past year?: No Number of falls in past year: 1 or less Was there an injury with Fall?: No Fall Risk Category Calculator: 0 Patient Fall Risk Level: Low Fall Risk    Psychosocial Psychosocial Symptoms Reported: Depression - if selected complete PHQ 2-9 Additional Psychological Details: Pt is having ongoing night terrors and PTSD symptoms due to past trauma. She reports sleep walking last night again. She is successfully scheduled now for both therapy and psychiatry at [facility] and is agreeable to walk to this physical location (it is near her residence) and go in as walk in patient. She still has not done this and was encouraged to consider this option but patient reports that she is mentally feeling better and will consider just calling [facility] herself to see if about an earlier appointment. Crisis support provided. No SI/HI reported. Behavioral Management Strategies: Coping strategies, Medication therapy Behavioral Health Self-Management Outcome: 3 (uncertain) Behavioral Health Comment: Patient is in school and attending class, goes to church functions and gains socialiation daily Major Change/Loss/Stressor/Fears (CP): Traumatic event Behaviors When Feeling Stressed/Fearful: Restless Techniques to Cope with Loss/Stress/Change: Diversional activities, Medication Quality of Family Relationships: involved      09/14/2023    1:08 PM  Depression screen PHQ 2/9  Decreased Interest 1  Down, Depressed, Hopeless 0  PHQ - 2 Score 1  Altered sleeping 3  Tired, decreased energy 2  Change in appetite 1  Feeling bad or failure about yourself  0  Trouble  concentrating 0  Moving slowly or fidgety/restless 0  Suicidal thoughts 0  PHQ-9 Score 7  Difficult doing work/chores Somewhat difficult    There were no vitals filed for this visit.  Medications Reviewed Today     Reviewed by Merlynn Lyle CROME, LCSW (Social Worker) on 09/14/23 at 1250  Med List Status: <None>   Medication Order Taking? Sig Documenting Provider Last Dose Status Informant  amLODipine  (NORVASC ) 5 MG tablet [id number] No TAKE 1 TABLET(5 MG) BY MOUTH AT BEDTIME Jagadish, Mayuri, MD Taking Active   ezetimibe  (ZETIA ) 10 MG tablet [id number] No TAKE 1 TABLET(10 MG) BY MOUTH DAILY Jagadish, Mayuri, MD Taking Active   famotidine  (PEPCID ) 40 MG tablet [id number]  TAKE 1 TABLET(40 MG) BY MOUTH DAILY Celestia Rosaline SQUIBB, NP  Active   FLUoxetine  (PROZAC ) 10 MG capsule [id number]  Take 1 capsule (10 mg total) by mouth daily. Celestia Rosaline SQUIBB, NP  Active   rosuvastatin  (CRESTOR ) 40 MG tablet [id number] No TAKE 1 TABLET BY MOUTH EVERY DAY Christia Budds, MD Taking Active             Recommendation:   PCP Follow-up Specialty provider follow-up [facility] Continue Current Plan of Care  Follow Up Plan:   Telephone follow-up in 1 month August 1st at 1030 am  Lyle Merlynn, BSW, MSW, LCSW Licensed Clinical Social Worker [employer]   [email or web address] Direct Dial: [phone]

## 2023-09-30 ENCOUNTER — Other Ambulatory Visit: Payer: Self-pay | Admitting: Licensed Clinical Social Worker

## 2023-09-30 NOTE — Patient Outreach (Addendum)
 Complex Care Management   Visit Note  09/30/2023  Name:  Tonya Mckenzie MRN: [id number] DOB: [birth year]  Situation: Referral received for Complex Care Management related to Mental/Behavioral Health diagnosis -Depression I obtained verbal consent from Patient.  Patient apologizes for missing scheduled call earlier today but wanted to confirm that she is not in a crisis and her behavioral health symptoms are improving since starting medication management. Appointment successfully rescheduled for next week to follow up and assess after her initial counseling session at [facility] on 10/04/23.   Background:   Past Medical History:  Diagnosis Date   Allergic rhinitis    Anxiety    Bacterial vaginosis    Chronic post-traumatic stress disorder (PTSD)    Cocaine abuse (HCC)    Depression    ETOH abuse    GERD (gastroesophageal reflux disease)    Hyperlipidemia    Hypertension    off meds due to weight loss   Marijuana abuse    Uterine leiomyoma        09/14/2023    1:08 PM  Depression screen PHQ 2/9  Decreased Interest 1  Down, Depressed, Hopeless 0  PHQ - 2 Score 1  Altered sleeping 3  Tired, decreased energy 2  Change in appetite 1  Feeling bad or failure about yourself  0  Trouble concentrating 0  Moving slowly or fidgety/restless 0  Suicidal thoughts 0  PHQ-9 Score 7  Difficult doing work/chores Somewhat difficult    There were no vitals filed for this visit.  Medications Reviewed Today   Medications were not reviewed in this encounter     Recommendation:   Continue Current Plan of Care  Lyle Rung, BSW, MSW, LCSW Licensed Clinical Social Worker [employer]   [email or web address] Direct Dial: [phone]

## 2023-10-04 ENCOUNTER — Ambulatory Visit (HOSPITAL_COMMUNITY): Admitting: Mental Health

## 2023-10-04 ENCOUNTER — Telehealth (INDEPENDENT_AMBULATORY_CARE_PROVIDER_SITE_OTHER): Payer: Self-pay | Admitting: Primary Care

## 2023-10-04 NOTE — Telephone Encounter (Unsigned)
 Copied from CRM [id number]. Topic: Clinical - Medication Question >> Oct 04, 2023 11:29 AM Delon DASEN wrote: Reason for CRM: FLUoxetine  (PROZAC ) 10 MG capsule- patient has been experiencing constipation since starting this medication- [phone]

## 2023-10-04 NOTE — Telephone Encounter (Unsigned)
 Copied from CRM #[id number]. Topic: Clinical - Medication Refill >> Oct 04, 2023 11:26 AM Delon T wrote: Medication: FLUoxetine  (PROZAC ) 10 MG capsule  Has the patient contacted their pharmacy? Yes (Agent: If no, request that the patient contact the pharmacy for the refill. If patient does not wish to contact the pharmacy document the reason why and proceed with request.) (Agent: If yes, when and what did the pharmacy advise?)  This is the patient's preferred pharmacy:  [id number] - [place], [place] - [address] AT [facility] OF [address] [place] [address] Phone: [phone] Fax: [phone]    Is this the correct pharmacy for this prescription? Yes If no, delete pharmacy and type the correct one.   Has the prescription been filled recently? Yes  Is the patient out of the medication? No  Has the patient been seen for an appointment in the last year OR does the patient have an upcoming appointment? Yes  Can we respond through MyChart? Yes  Agent: Please be advised that Rx refills may take up to 3 business days. We ask that you follow-up with your pharmacy.

## 2023-10-05 ENCOUNTER — Ambulatory Visit: Admitting: Gastroenterology

## 2023-10-05 NOTE — Progress Notes (Deleted)
 SABRA

## 2023-10-06 ENCOUNTER — Other Ambulatory Visit (INDEPENDENT_AMBULATORY_CARE_PROVIDER_SITE_OTHER): Payer: Self-pay | Admitting: Primary Care

## 2023-10-06 ENCOUNTER — Other Ambulatory Visit: Payer: Self-pay | Admitting: Licensed Clinical Social Worker

## 2023-10-06 DIAGNOSIS — E785 Hyperlipidemia, unspecified: Secondary | ICD-10-CM

## 2023-10-06 NOTE — Telephone Encounter (Signed)
 Copied from CRM #[id number]. Topic: Clinical - Medication Refill >> Oct 06, 2023  2:17 PM Shardie S wrote: Medication: FLUoxetine  (PROZAC ) 10 MG capsule ezetimibe  (ZETIA ) 10 MG tablet  Has the patient contacted their pharmacy? Yes (Agent: If no, request that the patient contact the pharmacy for the refill. If patient does not wish to contact the pharmacy document the reason why and proceed with request.) (Agent: If yes, when and what did the pharmacy advise?)  This is the patient's preferred pharmacy:  [id number] - [place], [place] - [address] AT [facility] OF [address] [place] [address] Phone: [phone] Fax: [phone]    Is this the correct pharmacy for this prescription? Yes If no, delete pharmacy and type the correct one.   Has the prescription been filled recently? No  Is the patient out of the medication? Yes  Has the patient been seen for an appointment in the last year OR does the patient have an upcoming appointment? Yes  Can we respond through MyChart? No  Agent: Please be advised that Rx refills may take up to 3 business days. We ask that you follow-up with your pharmacy.

## 2023-10-06 NOTE — Patient Outreach (Signed)
 Complex Care Management   Visit Note  10/06/2023  Name:  Tonya Mckenzie MRN: [id number] DOB: 1960/08/30  Situation: Referral received for Complex Care Management related to Mental/Behavioral Health diagnosis PTSD. I obtained verbal consent from Parent.  Visit completed with patient  on the phone  Background:   Past Medical History:  Diagnosis Date   Allergic rhinitis    Anxiety    Bacterial vaginosis    Chronic post-traumatic stress disorder (PTSD)    Cocaine abuse (HCC)    Depression    ETOH abuse    GERD (gastroesophageal reflux disease)    Hyperlipidemia    Hypertension    off meds due to weight loss   Marijuana abuse    Uterine leiomyoma     Assessment: Patient Reported Symptoms:  Cognitive Cognitive Status: Able to follow simple commands, Alert and oriented to person, place, and time, Normal speech and language skills Cognitive/Intellectual Conditions Management [RPT]: None reported or documented in medical history or problem list   Health Maintenance Behaviors: Annual physical exam Healing Pattern: Average Health Facilitated by: Stress management, Rest  Neurological Neurological Review of Symptoms: No symptoms reported Neurological Management Strategies: Adequate rest, Coping strategies Neurological Self-Management Outcome: 4 (good)  HEENT HEENT Symptoms Reported: No symptoms reported HEENT Management Strategies: Routine screening HEENT Self-Management Outcome: 4 (good)    Respiratory Respiratory Symptoms Reported: No symptoms reported    Endocrine Endocrine Symptoms Reported: No symptoms reported Is patient diabetic?: No    Gastrointestinal Gastrointestinal Symptoms Reported: No symptoms reported      Genitourinary Genitourinary Symptoms Reported: No symptoms reported    Integumentary Integumentary Symptoms Reported: No symptoms reported    Musculoskeletal Musculoskelatal Symptoms Reviewed: Back pain, Muscle pain Other Musculoskeletal Symptoms: Pain in the  back of her legs but recent injections seems to be helping some. Musculoskeletal Management Strategies: Adequate rest, Coping strategies, Medication therapy, Routine screening Musculoskeletal Self-Management Outcome: 3 (uncertain)      Psychosocial Psychosocial Symptoms Reported: Nightmares Additional Psychological Details: Pt is having ongoing night terrors and PTSD symptoms due to past trauma. She reports sleep walking last night again. She is successfully scheduled now for both therapy and psychiatry at [facility] and is agreeable to walk to this physical location (it is near her residence) and go in as walk in patient. She still has not done this and was encouraged to consider this option but patient reports that she is mentally feeling better and will consider just calling [facility] herself to see if about an earlier appointment. Crisis support provided. No SI/HI reported. Behavioral Management Strategies: Medication therapy, Coping strategies, Adequate rest Behavioral Health Self-Management Outcome: 4 (good) Major Change/Loss/Stressor/Fears (CP): Traumatic event Techniques to Cope with Loss/Stress/Change: Diversional activities, Medication Quality of Family Relationships: involved Do you feel physically threatened by others?: No      10/06/2023    3:18 PM  Depression screen PHQ 2/9  Decreased Interest 1  Down, Depressed, Hopeless 0  PHQ - 2 Score 1  Altered sleeping 3  Tired, decreased energy 2  Change in appetite 1  Feeling bad or failure about yourself  0  Trouble concentrating 0  Moving slowly or fidgety/restless 0  Suicidal thoughts 0  PHQ-9 Score 7  Difficult doing work/chores Somewhat difficult   SDOH Screenings   Food Insecurity: Food Insecurity Present (10/06/2023)  Housing: Low Risk  (10/06/2023)  Transportation Needs: No Transportation Needs (10/06/2023)  Utilities: Not At Risk (10/06/2023)  Alcohol Screen: Low Risk  (08/10/2023)  Depression (PHQ2-9): Medium Risk (10/06/2023)  Financial Resource Strain: Medium Risk (08/10/2023)  Social Connections: Unknown (08/10/2023)  Stress: No Stress Concern Present (10/06/2023)  Recent Concern: Stress - Stress Concern Present (09/14/2023)  Tobacco Use: Medium Risk (07/22/2023)   There were no vitals filed for this visit.  Medications Reviewed Today     Reviewed by Merlynn Lyle CROME, LCSW (Social Worker) on 10/06/23 at 1516  Med List Status: <None>   Medication Order Taking? Sig Documenting Provider Last Dose Status Informant  amLODipine  (NORVASC ) 5 MG tablet [id number] No TAKE 1 TABLET(5 MG) BY MOUTH AT BEDTIME Jagadish, Mayuri, MD Taking Active   ezetimibe  (ZETIA ) 10 MG tablet [id number] No TAKE 1 TABLET(10 MG) BY MOUTH DAILY Jagadish, Mayuri, MD Taking Active   famotidine  (PEPCID ) 40 MG tablet [id number]  TAKE 1 TABLET(40 MG) BY MOUTH DAILY Celestia Rosaline SQUIBB, NP  Active   FLUoxetine  (PROZAC ) 10 MG capsule [id number]  Take 1 capsule (10 mg total) by mouth daily. Celestia Rosaline SQUIBB, NP  Active   rosuvastatin  (CRESTOR ) 40 MG tablet [id number] No TAKE 1 TABLET BY MOUTH EVERY DAY Jagadish, Mayuri, MD Taking Active             Recommendation:   PCP Follow-up Continue Current Plan of Care  Follow Up Plan:   Telephone follow-up in 1 month  Lyle Merlynn, BSW, MSW, LCSW Licensed Clinical Social Worker [employer]   [facility] [email or web address] Direct Dial: [phone]

## 2023-10-06 NOTE — Patient Instructions (Signed)
 Visit Information  Thank you for taking time to visit with me today. Please don't hesitate to contact me if I can be of assistance to you before our next scheduled appointment.  Our next appointment is by telephone on 10/31/23 at 1 Please call the care guide team at [phone] if you need to cancel or reschedule your appointment.   Following is a copy of your care plan:   Goals Addressed             This Visit's Progress    LCSW VBCI Social Work Care Plan       Problems:   Limited access to food, Limited social support, and Mental Health Concerns  and Stress  CSW Clinical Goal(s):   Over the next 90 days the Patient will attend all scheduled medical appointments as evidenced by patient report and care team review of appointment completion in electronic MEDICAL RECORD NUMBERby VBCI LCSW  Over the next 90 days patient will improve symptoms of Behavioral Health - PTSD, Anxiety, Depression Goal: Reduce PTSD and anxiety symptoms within 90 days, demonstrated by improved sleep and decreased frequency of night terrors. Interventions: Referral to [facility] for psychiatry and counseling services (completed 08/10/23 and briefly again on 09/14/23), review and support medication adherence (Prozac  09/14/23-patient reports medication is finally working starting to show improvement), provided psycho-education on sleep hygiene and coping strategies, crisis resource education and motivational interviewing, regular assessment using PHQ-9 and GAD-7. Outcome: Patient reports already improved mood, better sleep quality, and fewer PTSD symptoms with new medication adjustment and having upcoming behavioral health appointments to look forward to. Patient missed her 10/04/23 initial therapy appointment at [facility] due to a respiratory infection but this appointment was successfully rescheduled for next month,    Over the next 60 days, patient will improve Musculoskeletal Pain - Back Pain and Limited Mobility Goal: Improve pain  control and mobility over 60 days, enabling increased daily activity/movement to increase blood flow and circulation. Interventions: Encourage balance of activity and rest, monitor pain response to recent injection and adjust plan as needed, educate on safe movement techniques and fall prevention, assess for use of assistive devices if needed. Emotional support and pain management coping skill education (provided again on 09/14/23 and 10/06/23.) Expected Outcome: Patient will report reduced back pain and improve ability to perform daily tasks.  Over the next 90 days, patient will increase her Social Support Needs - Limited Food Access and Overall Support Goal: Connect with at least one community food resource within 30 days and report improved food security. Interventions: Provide mailed list of local food pantries and community supports, assess transportation options and ability to access resources, provide emotional support and connect to social services, follow up to confirm patient engagement with resources (7/16 completed.) Outcome: Patient reports successfully receiving list of social support need resources. Patient will start utilizing food assistance and decreased stress related to food insecurity.  Interventions:  Mental Health:  Evaluation of current treatment plan related to Insomnia/Sleep Difficulties and PTSD Active listening / Reflection utilized Behavioral Activation reviewed Crisis Resource Education / information provided Depression screen reviewed Discussed referral for psychiatry: [facility] referral completed for psychiatry on 08/10/23 Discussed referral options to connect for ongoing therapy: [facility] referral completed for counseling on 08/10/23. Patient has now been successfully scheduled for both therapy and psychiatry but these appointments are not until August and September so patient was strongly encouraged to consider their walk in clinic and agreed to go there today. Emotional  Support Provided Made referral  to [facility] for PTSD and Insomnia symptom management Motivational Interviewing employed PHQ2/PHQ9 completed Problem Solving /Task Center strategies reviewed Provided general psycho-education for mental health needs Quality of sleep assessed & Sleep Hygiene techniques promoted Reviewed mental health medications and discussed importance of compliance: Patient taking prozac  for symptom relief and reports that it is effectively working for her at this time. Mailed mental health resources and food pantry list to patient's address as patient prefers this over email. Patient confirmed receiving these resources.  Suicidal Ideation/Homicidal Ideation assessed: No SI/HI reported Patient was provided positive reinforcement for actively combating her negative thought patterns.   Patient Goals/Self-Care Activities: -Call [facility] or go in as a walk in patient to get seen that very day, to check for any openings or to reschedule your counseling appointment for an earlier date. -Connect with provider for ongoing mental health treatment.   -Continue taking your medication as prescribed.   Increase coping skills, healthy habits, self-management skills, and stress reduction  Plan:   The care management team will reach out to the patient again over the next 30 days.         Please call the Suicide and Crisis Lifeline: 988 call the [place]  National Suicide Prevention Lifeline: 4025639798 or TTY: 956-834-1978 TTY 908-442-9913) to talk to a trained counselor go to [facility] [address], [place] [phone]) if you are experiencing a Mental Health or Behavioral Health Crisis or need someone to talk to.  Patient verbalizes understanding of instructions and care plan provided today and agrees to view in MyChart. Active MyChart status and patient understanding of how to access instructions and care plan via MyChart confirmed with patient.      Lyle Rung, BSW, MSW, LCSW Licensed Clinical Social Worker [facility]   [email or web address] Direct Dial: [phone]

## 2023-10-10 NOTE — Telephone Encounter (Signed)
 Requested medication (s) are due for refill today: yes  Requested medication (s) are on the active medication list: yes  Last refill:  07/11/23  Future visit scheduled: no  Notes to clinic:  message stating that a PA is needed for medications, routing for review     Requested Prescriptions  Pending Prescriptions Disp Refills   FLUoxetine  (PROZAC ) 10 MG capsule 90 capsule 0    Sig: Take 1 capsule (10 mg total) by mouth daily.     Psychiatry:  Antidepressants - SSRI Passed - 10/10/2023  2:14 PM      Passed - Completed PHQ-2 or PHQ-9 in the last 360 days      Passed - Valid encounter within last 6 months    Recent Outpatient Visits           2 months ago Herpes zoster vaccination declined   [facility] Celestia Rosaline SQUIBB, NP               ezetimibe  (ZETIA ) 10 MG tablet 90 tablet 0    Sig: TAKE 1 TABLET(10 MG) BY MOUTH DAILY     Cardiovascular:  Antilipid - Sterol Transport Inhibitors Failed - 10/10/2023  2:14 PM      Failed - AST in normal range and within 360 days    AST  Date Value Ref Range Status  08/12/2023 70 (H) 0 - 40 IU/L Final         Failed - ALT in normal range and within 360 days    ALT  Date Value Ref Range Status  08/12/2023 132 (H) 0 - 32 IU/L Final         Failed - Lipid Panel in normal range within the last 12 months    Cholesterol, Total  Date Value Ref Range Status  08/12/2023 157 100 - 199 mg/dL Final   LDL Chol Calc (NIH)  Date Value Ref Range Status  08/12/2023 79 0 - 99 mg/dL Final   LDL Direct  Date Value Ref Range Status  08/02/2019 71 0 - 99 mg/dL Final   HDL  Date Value Ref Range Status  08/12/2023 67 >39 mg/dL Final   Triglycerides  Date Value Ref Range Status  08/12/2023 55 0 - 149 mg/dL Final         Passed - Patient is not pregnant      Passed - Valid encounter within last 12 months    Recent Outpatient Visits           2 months ago Herpes zoster vaccination declined   [place]  [facility] Celestia Rosaline SQUIBB, NP

## 2023-10-11 ENCOUNTER — Telehealth (INDEPENDENT_AMBULATORY_CARE_PROVIDER_SITE_OTHER): Payer: Self-pay | Admitting: Primary Care

## 2023-10-11 NOTE — Telephone Encounter (Signed)
 Pt came in stating that she asked for medication to be refill. Pt stated that she needed to set up a sooner appt because she did not want to run out of medication. I let her know that I can schedule that appt and see if medication can be refilled. (Fluoxetine  10 MG) 90 day supply.

## 2023-10-17 ENCOUNTER — Other Ambulatory Visit (INDEPENDENT_AMBULATORY_CARE_PROVIDER_SITE_OTHER): Payer: Self-pay | Admitting: Primary Care

## 2023-10-17 ENCOUNTER — Telehealth: Payer: Self-pay

## 2023-10-17 MED ORDER — FLUOXETINE HCL 10 MG PO CAPS: 10.0000 mg | ORAL_CAPSULE | Freq: Every day | ORAL | 1 refills | Status: DC

## 2023-10-17 NOTE — Telephone Encounter (Signed)
 Copied from CRM #[id number]. Topic: Clinical - Prescription Issue >> Oct 17, 2023  8:18 AM Berneda FALCON wrote: Reason for CRM: Patient states the insurance will not cover the tablets but will cover the capsules.  FLUoxetine  (PROZAC ) 10 MG capsule  [id number] - [place], [place] - [address] AT [facility] OF [address] [place] [address] Phone: [phone] Fax: [phone] Hours: Open 24 hours  Patient callback is [phone]

## 2023-10-25 ENCOUNTER — Telehealth (INDEPENDENT_AMBULATORY_CARE_PROVIDER_SITE_OTHER): Payer: Self-pay | Admitting: Primary Care

## 2023-10-25 NOTE — Telephone Encounter (Signed)
 Spoke to pt about upcoming appt.. Will be present

## 2023-10-26 ENCOUNTER — Ambulatory Visit (INDEPENDENT_AMBULATORY_CARE_PROVIDER_SITE_OTHER): Admitting: Primary Care

## 2023-10-26 ENCOUNTER — Encounter (INDEPENDENT_AMBULATORY_CARE_PROVIDER_SITE_OTHER): Payer: Self-pay | Admitting: Primary Care

## 2023-10-26 VITALS — BP 128/81 | HR 66 | Temp 98.0°F | Resp 12 | Ht 66.0 in | Wt 178.8 lb

## 2023-10-26 DIAGNOSIS — I10 Essential (primary) hypertension: Secondary | ICD-10-CM

## 2023-10-26 DIAGNOSIS — F332 Major depressive disorder, recurrent severe without psychotic features: Secondary | ICD-10-CM | POA: Diagnosis not present

## 2023-10-26 DIAGNOSIS — Z760 Encounter for issue of repeat prescription: Secondary | ICD-10-CM | POA: Diagnosis not present

## 2023-10-26 DIAGNOSIS — K5900 Constipation, unspecified: Secondary | ICD-10-CM

## 2023-10-26 MED ORDER — SENNA-DOCUSATE SODIUM 8.6-50 MG PO TABS
1.0000 | ORAL_TABLET | Freq: Every day | ORAL | 1 refills | Status: AC
Start: 2023-10-26 — End: ?

## 2023-10-26 MED ORDER — FLUOXETINE HCL 10 MG PO TABS: 10.0000 mg | ORAL_TABLET | Freq: Every day | ORAL | 0 refills | Status: DC

## 2023-10-26 MED ORDER — AMLODIPINE BESYLATE 5 MG PO TABS: 5.0000 mg | ORAL_TABLET | Freq: Every day | ORAL | 1 refills | Status: AC

## 2023-10-26 MED ORDER — ROSUVASTATIN CALCIUM 10 MG PO TABS: 40.0000 mg | ORAL_TABLET | Freq: Every day | ORAL | 1 refills | Status: DC
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
## 2023-10-28 ENCOUNTER — Ambulatory Visit: Admitting: Family Medicine

## 2023-10-31 ENCOUNTER — Telehealth: Admitting: Licensed Clinical Social Worker

## 2023-11-01 ENCOUNTER — Encounter (HOSPITAL_COMMUNITY): Payer: Self-pay | Admitting: Student in an Organized Health Care Education/Training Program

## 2023-11-01 ENCOUNTER — Other Ambulatory Visit: Payer: Self-pay | Admitting: Licensed Clinical Social Worker

## 2023-11-01 ENCOUNTER — Ambulatory Visit (INDEPENDENT_AMBULATORY_CARE_PROVIDER_SITE_OTHER): Admitting: Student in an Organized Health Care Education/Training Program

## 2023-11-01 VITALS — BP 112/76 | HR 80 | Ht 66.0 in | Wt 180.0 lb

## 2023-11-01 DIAGNOSIS — F330 Major depressive disorder, recurrent, mild: Secondary | ICD-10-CM

## 2023-11-01 DIAGNOSIS — F4312 Post-traumatic stress disorder, chronic: Secondary | ICD-10-CM | POA: Diagnosis not present

## 2023-11-01 MED ORDER — FLUOXETINE HCL 20 MG PO CAPS: 20.0000 mg | ORAL_CAPSULE | Freq: Every day | ORAL | 1 refills | Status: DC

## 2023-11-01 MED ORDER — PRAZOSIN HCL 1 MG PO CAPS: 1.0000 mg | ORAL_CAPSULE | Freq: Every day | ORAL | 0 refills | Status: DC
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
## 2023-11-01 NOTE — Patient Outreach (Signed)
 Complex Care Management   Visit Note  11/02/2023  Name:  Tonya Mckenzie MRN: [id number] DOB: 04/01/1960  Situation: Referral received for Complex Care Management related to Mental/Behavioral Health diagnosis Anxiety, PTSD/night terrors and stress management needs. I obtained verbal consent from Patient.  Visit completed with Patient  on the phone  Background:   Past Medical History:  Diagnosis Date   Allergic rhinitis    Anxiety    Bacterial vaginosis    Chronic post-traumatic stress disorder (PTSD)    Cocaine abuse (HCC)    Depression    ETOH abuse    GERD (gastroesophageal reflux disease)    Hyperlipidemia    Hypertension    off meds due to weight loss   Marijuana abuse    Uterine leiomyoma     Assessment: Patient Reported Symptoms:  Cognitive Cognitive Status: Able to follow simple commands, Alert and oriented to person, place, and time, Normal speech and language skills Cognitive/Intellectual Conditions Management [RPT]: None reported or documented in medical history or problem list   Health Maintenance Behaviors: Annual physical exam Healing Pattern: Average Health Facilitated by: Rest, Stress management  Neurological Neurological Review of Symptoms: No symptoms reported Neurological Management Strategies: Adequate rest, Coping strategies, Routine screening Neurological Self-Management Outcome: 4 (good)  Psychosocial Psychosocial Symptoms Reported: Nightmares Additional Psychological Details: Pt is having ongoing night terrors and PTSD symptoms due to past trauma. She reports sleep walking last night again. She is successfully enrolled in both therapy and psychiatry programs at [facility] and is is aware of their walk in clinic if needed. Pt reports that she is mentally feeling better. Crisis support provided. No SI/HI reported. Behavioral Management Strategies: Medication therapy, Coping strategies, Adequate rest Behavioral Health Self-Management Outcome: 4 (good) Major  Change/Loss/Stressor/Fears (CP): Traumatic event Techniques to Cope with Loss/Stress/Change: Diversional activities, Medication Quality of Family Relationships: involved Do you feel physically threatened by others?: No    11/02/2023    PHQ2-9 Depression Screening   Little interest or pleasure in doing things Not at all  Feeling down, depressed, or hopeless Several days  PHQ-2 - Total Score 1  Trouble falling or staying asleep, or sleeping too much More than half the days  Feeling tired or having little energy Several days  Poor appetite or overeating  More than half the days  Feeling bad about yourself - or that you are a failure or have let yourself or your family down Not at all  Trouble concentrating on things, such as reading the newspaper or watching television Not at all  Moving or speaking so slowly that other people could have noticed.  Or the opposite - being so fidgety or restless that you have been moving around a lot more than usual Not at all  Thoughts that you would be better off dead, or hurting yourself in some way Not at all  PHQ2-9 Total Score 6  If you checked off any problems, how difficult have these problems made it for you to do your work, take care of things at home, or get along with other people Somewhat difficult  Depression Interventions/Treatment Medication, Counseling, Currently on Treatment    There were no vitals filed for this visit.  Medications Reviewed Today     Reviewed by Merlynn Lyle CROME, LCSW (Social Worker) on 11/02/23 at [phone]  Med List Status: <None>   Medication Order Taking? Sig Documenting Provider Last Dose Status Informant  amLODipine  (NORVASC ) 5 MG tablet [id number]  Take 1 tablet (5 mg total) by mouth  daily. Celestia Rosaline SQUIBB, NP  Active   famotidine  (PEPCID ) 40 MG tablet [id number]  TAKE 1 TABLET(40 MG) BY MOUTH DAILY Celestia Rosaline SQUIBB, NP  Active   FLUoxetine  (PROZAC ) 20 MG capsule [id number]  Take 1 capsule (20 mg total) by mouth  daily. Carrion-Carrero, Marlo, MD  Active   prazosin  (MINIPRESS ) 1 MG capsule [id number]  Take 1 capsule (1 mg total) by mouth at bedtime. Carrion-Carrero, Marlo, MD  Active   rosuvastatin  (CRESTOR ) 10 MG tablet [id number]  Take 4 tablets (40 mg total) by mouth daily. Celestia Rosaline SQUIBB, NP  Active   sennosides-docusate sodium  (SENOKOT-S) 8.6-50 MG tablet [id number]  Take 1 tablet by mouth daily. Celestia Rosaline SQUIBB, NP  Active             Recommendation:   PCP Follow-up Continue Current Plan of Care  Follow Up Plan:   Telephone follow-up in 1 month  Lyle Rung, BSW, MSW, LCSW Licensed Clinical Social Worker [employer]   [facility] [email or web address] Direct Dial: [phone]
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
## 2023-11-02 ENCOUNTER — Encounter: Payer: Self-pay | Admitting: Gastroenterology

## 2023-11-02 ENCOUNTER — Ambulatory Visit: Admitting: Family Medicine

## 2023-11-02 ENCOUNTER — Ambulatory Visit: Admitting: Gastroenterology

## 2023-11-02 VITALS — BP 108/78 | Ht 65.0 in | Wt 179.0 lb

## 2023-11-02 VITALS — BP 110/70 | HR 68 | Ht 65.0 in | Wt 179.0 lb

## 2023-11-02 DIAGNOSIS — M5416 Radiculopathy, lumbar region: Secondary | ICD-10-CM

## 2023-11-02 DIAGNOSIS — Z9049 Acquired absence of other specified parts of digestive tract: Secondary | ICD-10-CM | POA: Diagnosis not present

## 2023-11-02 DIAGNOSIS — K5904 Chronic idiopathic constipation: Secondary | ICD-10-CM

## 2023-11-02 DIAGNOSIS — K5909 Other constipation: Secondary | ICD-10-CM | POA: Diagnosis not present

## 2023-11-02 DIAGNOSIS — K219 Gastro-esophageal reflux disease without esophagitis: Secondary | ICD-10-CM | POA: Diagnosis not present

## 2023-11-02 MED ORDER — FAMOTIDINE 20 MG PO TABS: 20.0000 mg | ORAL_TABLET | Freq: Every day | ORAL | 2 refills | Status: DC

## 2023-11-02 MED ORDER — ESOMEPRAZOLE MAGNESIUM 40 MG PO CPDR: 40.0000 mg | DELAYED_RELEASE_CAPSULE | Freq: Every day | ORAL | 2 refills | Status: DC

## 2023-11-02 NOTE — Patient Instructions (Addendum)
 Weight loss My fitness pal - counts calories  GERD Recommend GERD diet, no late meals 3-4 hours before lying down Nexium  40mg  in the morning Pepcid  20mg  at bedtime  Constipation recommend high fiber diet Samples of Linzess 72 mcg po daily, take 1 tablet 30-45 minutes before first meal of day with water   If samples work well, Clinical cytogeneticist office to send prescription  _______________________________________________________  If your blood pressure at your visit was 140/90 or greater, please contact your primary care physician to follow up on this.  _______________________________________________________  If you are age 38 or older, your body mass index should be between 23-30. Your Body mass index is 29.79 kg/m. If this is out of the aforementioned range listed, please consider follow up with your Primary Care Provider.  If you are age 28 or younger, your body mass index should be between 19-25. Your Body mass index is 29.79 kg/m. If this is out of the aformentioned range listed, please consider follow up with your Primary Care Provider.   ________________________________________________________  The [facility] providers would like to encourage you to use MYCHART to communicate with providers for non-urgent requests or questions.  Due to long hold times on the telephone, sending your provider a message by [facility] may be a faster and more efficient way to get a response.  Please allow 48 business hours for a response.  Please remember that this is for non-urgent requests.  _______________________________________________________  [facility] is using a team-based approach to care.  Your team is made up of your doctor and two to three APPS. Our APPS (Nurse Practitioners and Physician Assistants) work with your physician to ensure care continuity for you. They are fully qualified to address your health concerns and develop a treatment plan. They communicate directly with your  gastroenterologist to care for you. Seeing the Advanced Practice Practitioners on your physician's team can help you by facilitating care more promptly, often allowing for earlier appointments, access to diagnostic testing, procedures, and other specialty referrals.    Thank you for trusting me with your gastrointestinal care. Deanna May, FNP-C

## 2023-11-02 NOTE — Addendum Note (Signed)
 Addended by: CARVIN CROCK on: 11/02/2023 12:59 PM   Modules accepted: Level of Service

## 2023-11-02 NOTE — Patient Instructions (Signed)
 Visit Information  Tonya Mckenzie was given information about Medicaid Managed Care team care coordination services as a part of their [facility] Medicaid benefit.   If you would like to schedule transportation through your [facility] plan, please call the following number at least 2 days in advance of your appointment: [phone].   You can also use the MTM portal or MTM mobile app to manage your rides. Reimbursement for transportation is available through [facility]! For the portal, please go to [email or web address]  Call the [facility] Crisis Line at [phone], at any time, 24 hours a day, 7 days a week. If you are in danger or need immediate medical attention call 911.   Tonya Mckenzie - following are the goals we discussed in your visit today:   Goals Addressed             This Visit's Progress    LCSW VBCI Social Work Care Plan       Problems:   Limited access to food, Limited social support, and Mental Health Concerns  and Stress  CSW Clinical Goal(s):   Over the next 90 days the Patient will attend all scheduled medical appointments as evidenced by patient report and care team review of appointment completion in electronic MEDICAL RECORD NUMBERby VBCI LCSW  Over the next 90 days patient will improve symptoms of Behavioral Health - PTSD, Anxiety, Depression  Goal: Reduce PTSD and anxiety symptoms within 90 days, demonstrated by improved sleep and decreased frequency of night terrors. Interventions: Referral to [facility] for psychiatry and counseling services (completed 08/10/23 and briefly again on 09/14/23), review and support medication adherence (Prozac  09/14/23-patient reports medication is finally working starting to show improvement), provided psycho-education on sleep hygiene and coping strategies, crisis resource education and motivational interviewing, regular assessment using PHQ-9 and GAD-7. Outcome: Patient reports already improved mood, better sleep quality, and fewer PTSD  symptoms with new medication adjustment and having upcoming behavioral health appointments to look forward to. Patient missed her 10/04/23 initial therapy appointment at [facility] due to a respiratory infection but this appointment was successfully rescheduled for next month,    Over the next 60 days, patient will improve Musculoskeletal Pain - Back Pain and Limited Mobility Goal: Improve pain control and mobility over 60 days, enabling increased daily activity/movement to increase blood flow and circulation. Interventions: Encourage balance of activity and rest, monitor pain response to recent injection and adjust plan as needed, educate on safe movement techniques and fall prevention, assess for use of assistive devices if needed. Emotional support and pain management coping skill education (provided again on 09/14/23 and 10/06/23.) Expected Outcome: Patient will report reduced back pain and improve ability to perform daily tasks.  Over the next 90 days, patient will increase her Social Support Needs - Limited Food Access and Overall Support Goal: Connect with at least one community food resource within 30 days and report improved food security. Interventions: Provide mailed list of local food pantries and community supports, assess transportation options and ability to access resources, provide emotional support and connect to social services, follow up to confirm patient engagement with resources (7/16 completed.) Outcome: Patient reports successfully receiving list of social support need resources. Patient will start utilizing food assistance and decreased stress related to food insecurity.  Interventions:  Mental Health:  Evaluation of current treatment plan related to Insomnia/Sleep Difficulties and PTSD Active listening / Reflection utilized Behavioral Activation reviewed Crisis Resource Education / information provided Depression screen reviewed Discussed referral for psychiatry: [facility] referral  completed for psychiatry on 08/10/23 Discussed referral options to connect for ongoing therapy: [facility] referral completed for counseling on 08/10/23. Patient has now been successfully scheduled for both therapy and psychiatry but these appointments are not until August and September so patient was strongly encouraged to consider their walk in clinic and agreed to go there today. 11/01/23 update- Patient is now active with both therapy and psychiatry at [facility].  Emotional Support Provided Motivational Interviewing employed PHQ2/PHQ9 completed Problem Solving /Task Center strategies reviewed Provided general psycho-education for mental health needs Quality of sleep assessed & Sleep Hygiene techniques promoted Reviewed mental health medications and discussed importance of compliance: Patient taking prozac  for symptom relief and reports that it is effectively working for her at this time. Patient successfully attended psychiatry appointment at [facility] yesterday on 11/01/23. Mailed mental health resources and food pantry list to patient's address as patient prefers this over email. Patient confirmed receiving these resources.  Suicidal Ideation/Homicidal Ideation assessed: No SI/HI reported Patient was provided positive reinforcement for actively combating her negative thought patterns.   Patient Goals/Self-Care Activities: -Call [facility] or go in as a walk in patient to get seen that very day if needed -Connect with provider for ongoing mental health treatment.   -Continue taking your medication as prescribed.   Increase coping skills, healthy habits, self-management skills, and stress reduction  Plan:   The care management team will reach out to the patient again over the next 30 days.         Patient verbalizes understanding of instructions and care plan provided today and agrees to view in MyChart. Active MyChart status and patient understanding of how to access instructions and care plan via MyChart  confirmed with patient.     Licensed Clinical Social Worker will call you on 12/02/23 at 11:15 am  Lyle Rung, BSW, MSW, [employer] Licensed Clinical Social Worker [employer]   [facility] [email or web address] Direct Dial: [phone]

## 2023-11-02 NOTE — Progress Notes (Signed)
 Chief Complaint: follow-up GERD and CIC Primary GI Doctor:Dr. Legrand  HPI: 63 y.o. female  with a past medical history of hyperlipidemia, hypertension, marijuana use, cocaine use, PTSD with anxiety and depression, prediabetes.  And others listed below, returns to clinic today for evaluation of  GERD, and constipation. Status post cholecystectomy and hysterectomy.   05/09/2014 colonoscopy Dr. Debrah for screening purposes normal colonoscopy recall 10 years.  Excellent prep with Suprep. 04/2024 04/2016 EGD for chronic epigastric burning with biopsies unremarkable 11/26/2021 office visit for reflux with benign EGD, discussed lifestyle changes, did trial of Nexium  and Pepcid .   Also thought possible muscular component on exam discussed trying salon pas patches and avoiding NSAIDs. 02/16/22 GI follow-up with Alan, Tonya Mckenzie for evaluation of GERD.   Interval History    Patient presents for follow-up on GERD and chronic constipation.  Patient reports she is only taking Pepcid  40 mg once daily for her GERD which is not managing her symptoms.  Patient has taken Nexium  in the past which was effective however she states her symptoms were improving therefore she stopped medication.  Patient reports she has daily pyrosis and regurgitation.  Patient will wake up in the middle of the night regurgitating food.  Patient admits she does eat late at night.  Patient denies dysphagia.  We spent several minutes discussing dietary modifications.  Patient has recently gained 8 pounds therefore we discussed weight loss.  Patient has membership at [employer].    She also has chronic constipation and has used OTC suppository laxatives, OTC Miralax , and high fiber diet without much relief. She has bowel movement every 3-4 days. No blood in stool.   She uses goody powders, very seldom.   She drinks occasional beer. Nonsmoker  Wt Readings from Last 3 Encounters:  11/02/23 179 lb (81.2 kg)  10/26/23 178 lb 12.8 oz (81.1  kg)  07/15/23 171 lb 6.4 oz (77.7 kg)    Past Medical History:  Diagnosis Date   Allergic rhinitis    Anxiety    Bacterial vaginosis    Chronic post-traumatic stress disorder (PTSD)    Cocaine abuse (HCC)    Depression    ETOH abuse    GERD (gastroesophageal reflux disease)    Hyperlipidemia    Hypertension    off meds due to weight loss   Marijuana abuse    Uterine leiomyoma    Past Surgical History:  Procedure Laterality Date   ABDOMINAL HYSTERECTOMY     left ovaries, took cervix.  Performed for heavy periods and anemia   BICEPT TENODESIS Left 03/06/2020   Procedure: BICEPS TENODESIS;  Surgeon: Cristy Bonner DASEN, MD;  Location: [facility];  Service: Orthopedics;  Laterality: Left;   CHOLECYSTECTOMY     SHOULDER ARTHROSCOPY WITH ROTATOR CUFF REPAIR AND SUBACROMIAL DECOMPRESSION Left 03/06/2020   Procedure: SHOULDER ARTHROSCOPY WITH ROTATOR CUFF REPAIR AND SUBACROMIAL DECOMPRESSION PARTIAL ACROMIOPLASTY AND DISTAL CLAVICULECTOMY;  Surgeon: Cristy Bonner DASEN, MD;  Location: [facility];  Service: Orthopedics;  Laterality: Left;   thumb surgery     right   TOE SURGERY Left    TONSILLECTOMY     TYMPANOSTOMY TUBE PLACEMENT      Current Outpatient Medications  Medication Sig Dispense Refill   amLODipine  (NORVASC ) 5 MG tablet Take 1 tablet (5 mg total) by mouth daily. 90 tablet 1   famotidine  (PEPCID ) 40 MG tablet TAKE 1 TABLET(40 MG) BY MOUTH DAILY 90 tablet 0   FLUoxetine  (PROZAC ) 20 MG capsule Take  1 capsule (20 mg total) by mouth daily. 30 capsule 1   prazosin  (MINIPRESS ) 1 MG capsule Take 1 capsule (1 mg total) by mouth at bedtime. 30 capsule 0   rosuvastatin  (CRESTOR ) 10 MG tablet Take 4 tablets (40 mg total) by mouth daily. 90 tablet 1   sennosides-docusate sodium  (SENOKOT-S) 8.6-50 MG tablet Take 1 tablet by mouth daily. 90 tablet 1   XIIDRA 5 % SOLN Apply 1 drop to eye 2 (two) times daily.     No current facility-administered medications for this  visit.    Allergies as of 11/02/2023   (No Known Allergies)    Family History  Problem Relation Age of Onset   Diabetes Mother    Hypertension Mother    Hypercholesterolemia Mother    Heart attack Father    Heart attack Brother    Colon cancer Neg Hx    Esophageal cancer Neg Hx    Rectal cancer Neg Hx    Stomach cancer Neg Hx    Liver cancer Neg Hx    Breast cancer Neg Hx     Review of Systems:    Constitutional: No weight loss, fever, chills, weakness or fatigue HEENT: Eyes: No change in vision               Ears, Nose, Throat:  No change in hearing or congestion Skin: No rash or itching Cardiovascular: No chest pain, chest pressure or palpitations   Respiratory: No SOB or cough Gastrointestinal: See HPI and otherwise negative Genitourinary: No dysuria or change in urinary frequency Neurological: No headache, dizziness or syncope Musculoskeletal: No new muscle or joint pain Hematologic: No bleeding or bruising Psychiatric: No history of depression or anxiety    Physical Exam:  Vital signs: BP 110/70   Pulse 68   Ht 5' 5 (1.651 m)   Wt 179 lb (81.2 kg)   BMI 29.79 kg/m   Constitutional:   Pleasant A.A. female appears to be in NAD, Well developed, Well nourished, alert and cooperative Throat: Oral cavity and pharynx without inflammation, swelling or lesion.  Respiratory: Respirations even and unlabored. Lungs clear to auscultation bilaterally.   No wheezes, crackles, or rhonchi.  Cardiovascular: Normal S1, S2. Regular rate and rhythm. No peripheral edema, cyanosis or pallor.  Gastrointestinal:  Soft, nondistended, nontender. No rebound or guarding.  Hyppoactive bowel sounds. No appreciable masses or hepatomegaly. Rectal:  Not performed.  Msk:  Symmetrical without gross deformities. Without edema, no deformity or joint abnormality.  Neurologic:  Alert and  oriented x4;  grossly normal neurologically.  Skin:   Dry and intact without significant lesions or  rashes.  RELEVANT LABS AND IMAGING: CBC    Latest Ref Rng & Units 08/12/2023   10:53 AM 02/16/2022   10:35 AM 08/21/2020   11:49 AM  CBC  WBC 3.4 - 10.8 x10E3/uL 7.6  7.0  8.2   Hemoglobin 11.1 - 15.9 g/dL 86.3  86.3  85.4   Hematocrit 34.0 - 46.6 % 43.1  41.2  43.8   Platelets 150 - 450 x10E3/uL 226  213.0  246      CMP     Latest Ref Rng & Units 08/12/2023   10:53 AM 05/26/2022    2:45 PM 03/09/2022   10:43 AM  CMP  Glucose 70 - 99 mg/dL 895  897  99   BUN 8 - 27 mg/dL 13  13  14    Creatinine 0.57 - 1.00 mg/dL 9.10  8.92  9.06   Sodium  134 - 144 mmol/L 142  141  144   Potassium 3.5 - 5.2 mmol/L 4.8  4.3  4.3   Chloride 96 - 106 mmol/L 106  105  105   CO2 20 - 29 mmol/L 22  24  23    Calcium  8.7 - 10.3 mg/dL 9.3  9.5  9.7   Total Protein 6.0 - 8.5 g/dL 6.6  6.6  6.3   Total Bilirubin 0.0 - 1.2 mg/dL 0.2  0.2  0.4   Alkaline Phos 44 - 121 IU/L 109  111  106   AST 0 - 40 IU/L 70  29  67   ALT 0 - 32 IU/L 132  54  141      Lab Results  Component Value Date   TSH 1.28 02/16/2022     Assessment:    63 year old female patient with history of GERD that is uncontrolled on Pepcid  40 mg p.o. daily.  Will go ahead and restart patient on Nexium  40 mg in the morning and Pepcid  at bedtime.  Reinforced strict GERD diet and not eating late at night.  Also encouraged weight loss.    Patient also has chronic constipation not relieved with over-the-counter laxatives and suppositories.  Will go ahead and provide patient samples of pro secretory agent Linzess.     Patient up to date on colonoscopy, next due March 2026.  Plan: - Reinforce GERD diet, no late meals - Restart Nexium  40mg  po daily before breakfast - Take Pepcid  20mg  at bedtime -counseled on limiting NSAID use -encouraged weight loss -recommend high fiber diet -Samples of Linzess 72 mcg po daily  -Recall colonoscopy 04/2024  Thank you for the courtesy of this consult. Please call me with any questions or concerns.   Decklan Mau, FNP-C [facility] 11/02/2023, 11:05 AM  Cc: Tonya Rosaline SQUIBB, NP

## 2023-11-03 NOTE — Progress Notes (Signed)
 PCP: Celestia Rosaline SQUIBB, NP  Subjective:   HPI: Patient is a 63 y.o. female here for low back and leg pain.  3/27: Has been a chronic, longstanding issue for many years, first seen by us  for this 04/15/2021 Seeing us  about 3-4 times a year for this condition last several years, last visit with Dr. Cleatrice 04/04/2023 Symptoms continue to be relatively unchanged, continues to have low back pain radiating into the buttock and posterior leg stopping at the knee bilaterally She is in the process of getting scheduled for lumbar epidural injections, but she is in the process of changing her insurance and new insurance does not take effect until 05/31/2023.  Waiting to schedule until that time She has done relatively well with IM Toradol  and Depo-Medrol  injections in the past, last 04/04/2023 Dr. Cleatrice has discussed consideration of neuropathic agents in the past, but she was hesitant to proceed with these Dr. Cleatrice also discussed consideration of referral to neurosurgery, but patient was hesitant to proceed with this as well She denies any new injury or trauma Denies any changes in bowel or bladder Denies any lower extremity weakness Currently taking Tylenol , ibuprofen , BC powder without improvement  9/3: Patient reports most recent epidural injection took pain away completely but started feeling this coming back in past 2 weeks. That injection was 07/05/23. Pain worse in morning. Feels the need to stretch. No numbness/tingling in legs. Worse with extension. No bowel/bladder dysfunction.  Past Medical History:  Diagnosis Date   Allergic rhinitis    Anxiety    Bacterial vaginosis    Chronic post-traumatic stress disorder (PTSD)    Cocaine abuse (HCC)    Depression    ETOH abuse    GERD (gastroesophageal reflux disease)    Hyperlipidemia    Hypertension    off meds due to weight loss   Marijuana abuse    Uterine leiomyoma     Current Outpatient Medications on File Prior to Visit   Medication Sig Dispense Refill   amLODipine  (NORVASC ) 5 MG tablet Take 1 tablet (5 mg total) by mouth daily. 90 tablet 1   esomeprazole  (NEXIUM ) 40 MG capsule Take 1 capsule (40 mg total) by mouth daily at 12 noon. 30 capsule 2   famotidine  (PEPCID ) 20 MG tablet Take 1 tablet (20 mg total) by mouth at bedtime. 30 tablet 2   FLUoxetine  (PROZAC ) 20 MG capsule Take 1 capsule (20 mg total) by mouth daily. 30 capsule 1   prazosin  (MINIPRESS ) 1 MG capsule Take 1 capsule (1 mg total) by mouth at bedtime. 30 capsule 0   rosuvastatin  (CRESTOR ) 10 MG tablet Take 4 tablets (40 mg total) by mouth daily. 90 tablet 1   sennosides-docusate sodium  (SENOKOT-S) 8.6-50 MG tablet Take 1 tablet by mouth daily. 90 tablet 1   XIIDRA 5 % SOLN Apply 1 drop to eye 2 (two) times daily.     No current facility-administered medications on file prior to visit.    Past Surgical History:  Procedure Laterality Date   ABDOMINAL HYSTERECTOMY     left ovaries, took cervix.  Performed for heavy periods and anemia   BICEPT TENODESIS Left 03/06/2020   Procedure: BICEPS TENODESIS;  Surgeon: Cristy Bonner DASEN, MD;  Location: [facility];  Service: Orthopedics;  Laterality: Left;   CHOLECYSTECTOMY     SHOULDER ARTHROSCOPY WITH ROTATOR CUFF REPAIR AND SUBACROMIAL DECOMPRESSION Left 03/06/2020   Procedure: SHOULDER ARTHROSCOPY WITH ROTATOR CUFF REPAIR AND SUBACROMIAL DECOMPRESSION PARTIAL ACROMIOPLASTY AND DISTAL CLAVICULECTOMY;  Surgeon: Cristy Bonner DASEN, MD;  Location:  SURGERY CENTER;  Service: Orthopedics;  Laterality: Left;   thumb surgery     right   TOE SURGERY Left    TONSILLECTOMY     TYMPANOSTOMY TUBE PLACEMENT      No Known Allergies  BP 108/78   Ht 5' 5 (1.651 m)   Wt 179 lb (81.2 kg)   BMI 29.79 kg/m      10/31/2020    9:47 AM 11/14/2020    9:15 AM 11/28/2020    8:50 AM 11/28/2020    9:16 AM 12/17/2020    3:21 PM 01/28/2021    9:53 AM  [facility]  Frequency of  aerobic exercise (# of days/week) 4 4 4 4 4 4   Average time in minutes 40 40 40 5 5 5   Frequency of strengthening activities (# of days/week) 4 4 4 2 2 2         No data to display              Objective:  Physical Exam:  Gen: NAD, comfortable in exam room  Back: No gross deformity, scoliosis. No tenderness paraspinal muscles.  No midline or bony TTP. FROM but pain with extension. Strength LEs 5/5 all muscle groups.   2+ MSRs in patellar and achilles tendons, equal bilaterally. Negative SLRs. Sensation intact to light touch bilaterally.   Assessment & Plan:  1. 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  Moderate spinal stenosis at L3-4 and mild-mod at L4-5 seem to be the most symptomatic.  Has moderate facet arthropathy at these levels too.  Last ESI at L4-5 on left helped tremendously - will repeat this.  BC powders as needed.  Flexion based exercises.

## 2023-11-04 ENCOUNTER — Other Ambulatory Visit: Payer: Self-pay | Admitting: Family Medicine

## 2023-11-04 DIAGNOSIS — M5416 Radiculopathy, lumbar region: Secondary | ICD-10-CM

## 2023-11-07 ENCOUNTER — Telehealth: Payer: Self-pay | Admitting: Gastroenterology

## 2023-11-07 DIAGNOSIS — K5904 Chronic idiopathic constipation: Secondary | ICD-10-CM

## 2023-11-07 MED ORDER — LINACLOTIDE 72 MCG PO CAPS: 72.0000 ug | ORAL_CAPSULE | Freq: Every day | ORAL | 3 refills | Status: DC

## 2023-11-07 NOTE — Telephone Encounter (Signed)
 Have sent Linzess  72 mcg 30 with 3 refills to the patients pharmacy

## 2023-11-07 NOTE — Telephone Encounter (Signed)
 Patient requesting prescription for linzess . States the medication works very well. Please advise.

## 2023-11-09 NOTE — Progress Notes (Signed)
 ____________________________________________________________  Attending physician addendum:  Thank you for sending this case to me. I have reviewed the entire note and agree with the plan.  She needs follow-up with you as well.  If she has persistent reflux symptoms despite adherence to antireflux diet and lifestyle recommendations as well as increased acid suppression medicine as prescribed, may need to be reevaluated for endoscopic or surgical fundoplication candidacy.  Victory Brand, MD  ____________________________________________________________

## 2023-11-09 NOTE — Progress Notes (Signed)
 December schedule isn't open yet. 3 month recall placed.

## 2023-11-17 ENCOUNTER — Other Ambulatory Visit (INDEPENDENT_AMBULATORY_CARE_PROVIDER_SITE_OTHER): Payer: Self-pay | Admitting: Primary Care

## 2023-11-18 ENCOUNTER — Ambulatory Visit (INDEPENDENT_AMBULATORY_CARE_PROVIDER_SITE_OTHER): Admitting: Primary Care

## 2023-11-18 NOTE — Telephone Encounter (Signed)
 Requested medications are due for refill today.  Yes - pt takes 40 mg daily  Requested medications are on the active medications list.  yes  Last refill. 10/26/2023 #90 1 rf  Future visit scheduled.   no  Notes to clinic.  Pt medication to this pt. Please review for refill.    Requested Prescriptions  Pending Prescriptions Disp Refills   rosuvastatin  (CRESTOR ) 10 MG tablet [Pharmacy Med Name: ROSUVASTATIN  10MG  TABLETS] 368 tablet     Sig: TAKE 4 TABLETS(40 MG) BY MOUTH DAILY     Cardiovascular:  Antilipid - Statins 2 Failed - 11/18/2023 12:45 PM      Failed - Lipid Panel in normal range within the last 12 months    Cholesterol, Total  Date Value Ref Range Status  08/12/2023 157 100 - 199 mg/dL Final   LDL Chol Calc (NIH)  Date Value Ref Range Status  08/12/2023 79 0 - 99 mg/dL Final   LDL Direct  Date Value Ref Range Status  08/02/2019 71 0 - 99 mg/dL Final   HDL  Date Value Ref Range Status  08/12/2023 67 >39 mg/dL Final   Triglycerides  Date Value Ref Range Status  08/12/2023 55 0 - 149 mg/dL Final         Passed - Cr in normal range and within 360 days    Creatinine, Ser  Date Value Ref Range Status  08/12/2023 0.89 0.57 - 1.00 mg/dL Final         Passed - Patient is not pregnant      Passed - Valid encounter within last 12 months    Recent Outpatient Visits           3 weeks ago Essential hypertension   [facility] Celestia Rosaline SQUIBB, NP   4 months ago Herpes zoster vaccination declined   [facility] Celestia Rosaline SQUIBB, NP

## 2023-11-21 NOTE — Discharge Instructions (Signed)

## 2023-11-22 ENCOUNTER — Inpatient Hospital Stay
Admission: RE | Admit: 2023-11-22 | Discharge: 2023-11-22 | Disposition: A | Discharge status: Home / Self Care | Source: Ambulatory Visit | Attending: Family Medicine | Admitting: Family Medicine

## 2023-11-25 ENCOUNTER — Encounter: Payer: Self-pay | Admitting: Family Medicine

## 2023-11-28 ENCOUNTER — Telehealth (HOSPITAL_COMMUNITY): Payer: Self-pay

## 2023-11-28 DIAGNOSIS — F4312 Post-traumatic stress disorder, chronic: Secondary | ICD-10-CM

## 2023-11-28 MED ORDER — PRAZOSIN HCL 1 MG PO CAPS: 1.0000 mg | ORAL_CAPSULE | Freq: Every day | ORAL | 2 refills | Status: DC

## 2023-11-28 NOTE — Telephone Encounter (Signed)
 received fax requesting a refill on the prazosin . pt was last seen on 9-2 next appt 10-7

## 2023-11-28 NOTE — Telephone Encounter (Signed)
 Reviewed dispense history.  Sent to pharmacy: Prazosin  1 mg nightly, 30 tablets, 2 refills  [name], MD PGY-3, [facility]

## 2023-11-29 ENCOUNTER — Telehealth (HOSPITAL_COMMUNITY): Payer: Self-pay

## 2023-11-29 ENCOUNTER — Ambulatory Visit (HOSPITAL_COMMUNITY): Admitting: Mental Health

## 2023-11-29 DIAGNOSIS — F4310 Post-traumatic stress disorder, unspecified: Secondary | ICD-10-CM

## 2023-11-29 NOTE — Telephone Encounter (Signed)
 Contacted and spoke with patient at [phone].  She reports difficulty getting to sleep.    She reports she purchased melatonin (10 mg) this week, tried it for a night and found it helpful.  She reports she paused on taking it as she was concerned it may interact with her medication.  Patient encouraged to continue taking melatonin, reassurance provided, no known interactions between melatonin and patient's prescribed medications.  Advised that patient contact us  again if sleep does not improve.  All questions were answered.  Patient has follow-up appointment with me on 12/06/2023.  Kathalina Ostermann Carrin Carrero, MD PGY-3, [facility]

## 2023-11-29 NOTE — Progress Notes (Signed)
 Non-bill:2:05pm - 2:22pm- Therapist sent link for tele-therapy appointment ( initially in person however pt contacted [facility] OP to transfer to virtual). Upon connection pt could not be heard by therapist, with pt able to hear therapist. Therapist attempted to guide to unmute and adjust settings with no success. Attempted to contact pt with no success with phone going to voicemail. Therapist contacted front desk for support in which further attempts were made to adjust settings to allow microphone use with no success. Pt was reschedule for in person appointment 10/22 @ 8am.

## 2023-11-29 NOTE — Progress Notes (Signed)
 Tonya Mckenzie

## 2023-11-29 NOTE — Telephone Encounter (Signed)
Left message that rx had been sent.

## 2023-12-02 ENCOUNTER — Other Ambulatory Visit: Payer: Self-pay | Admitting: Licensed Clinical Social Worker

## 2023-12-02 NOTE — Patient Instructions (Signed)
 Visit Information  Thank you for taking time to visit with me today. Please don't hesitate to contact me if I can be of assistance to you in the future!  Following is a copy of your care plan:   Goals Addressed             This Visit's Progress    COMPLETED: LCSW VBCI Social Work Care Plan       Problems:   Limited access to food, Limited social support, and Mental Health Concerns  and Stress  CSW Clinical Goal(s):   Over the next 90 days the Patient will attend all scheduled medical appointments as evidenced by patient report and care team review of appointment completion in electronic MEDICAL RECORD NUMBERby VBCI LCSW  Over the next 90 days patient will improve symptoms of Behavioral Health - PTSD, Anxiety, Depression  Goal: Reduce PTSD and anxiety symptoms within 90 days, demonstrated by improved sleep and decreased frequency of night terrors. Interventions: Referral to [facility] for psychiatry and counseling services (completed 08/10/23 and briefly again on 09/14/23), review and support medication adherence (Prozac  09/14/23-patient reports medication is finally working starting to show improvement), provided psycho-education on sleep hygiene and coping strategies, crisis resource education and motivational interviewing, regular assessment using PHQ-9 and GAD-7. Outcome: Patient reports already improved mood, better sleep quality, and fewer PTSD symptoms with new medication adjustment and having upcoming behavioral health appointments to look forward to. Patient missed her 10/04/23 initial therapy appointment at [facility] due to a respiratory infection but this appointment was successfully rescheduled for next month,    Over the next 60 days, patient will improve Musculoskeletal Pain - Back Pain and Limited Mobility Goal: Improve pain control and mobility over 60 days, enabling increased daily activity/movement to increase blood flow and circulation. Interventions: Encourage balance of activity and  rest, monitor pain response to recent injection and adjust plan as needed, educate on safe movement techniques and fall prevention, assess for use of assistive devices if needed. Emotional support and pain management coping skill education (provided again on 09/14/23 and 10/06/23.) Expected Outcome: Patient will report reduced back pain and improve ability to perform daily tasks.  Over the next 90 days, patient will increase her Social Support Needs - Limited Food Access and Overall Support Goal: Connect with at least one community food resource within 30 days and report improved food security. Interventions: Provide mailed list of local food pantries and community supports, assess transportation options and ability to access resources, provide emotional support and connect to social services, follow up to confirm patient engagement with resources (7/16 completed.) Outcome: Patient reports successfully receiving list of social support need resources. Patient will start utilizing food assistance and decreased stress related to food insecurity.  Interventions:  Mental Health:  Evaluation of current treatment plan related to Insomnia/Sleep Difficulties and PTSD Active listening / Reflection utilized Behavioral Activation reviewed Crisis Resource Education / information provided Depression screen reviewed Discussed referral for psychiatry: [facility] referral completed for psychiatry on 08/10/23 Discussed referral options to connect for ongoing therapy: [facility] referral completed for counseling on 08/10/23. Patient has now been successfully scheduled for both therapy and psychiatry but these appointments are not until August and September so patient was strongly encouraged to consider their walk in clinic and agreed to go there today. 11/01/23 update- Patient is now active with both therapy and psychiatry at [facility].  Emotional Support Provided Motivational Interviewing employed PHQ2/PHQ9 completed Problem Solving  /Task Center strategies reviewed Provided general psycho-education for mental health  needs Quality of sleep assessed & Sleep Hygiene techniques promoted Reviewed mental health medications and discussed importance of compliance: Patient taking prozac  for symptom relief and reports that it is effectively working for her at this time. Patient successfully attended psychiatry appointment at [facility] yesterday on 11/01/23. Mailed mental health resources and food pantry list to patient's address as patient prefers this over email. Patient confirmed receiving these resources.  Suicidal Ideation/Homicidal Ideation assessed: No SI/HI reported Patient was provided positive reinforcement for actively combating her negative thought patterns.   Patient Goals/Self-Care Activities: -Call [facility] or go in as a walk in patient to get seen that very day if needed -Connect with provider for ongoing mental health treatment.   -Continue taking your medication as prescribed.   Increase coping skills, healthy habits, self-management skills, and stress reduction  Plan:   The care management team will reach out to the patient again over the next 30 days.         Please call the Suicide and Crisis Lifeline: 988 call the [place]  National Suicide Prevention Lifeline: (340)430-8476 or TTY: (941)140-5535 TTY 216-007-8584) to talk to a trained counselor call 1-800-273-TALK (toll free, 24 hour hotline) go to [facility] [address] [place] [phone]) call 911 if you are experiencing a Mental Health or Behavioral Health Crisis or need someone to talk to.  Patient verbalizes understanding of instructions and care plan provided today and agrees to view in MyChart. Active MyChart status and patient understanding of how to access instructions and care plan via MyChart confirmed with patient.     Lyle Rung, BSW, MSW, LCSW Licensed Clinical Social Worker [facility]   [facility] [email or web address] Direct Dial: [phone]

## 2023-12-02 NOTE — Patient Outreach (Signed)
 Complex Care Management   Visit Note  12/02/2023  Name:  Tonya Mckenzie MRN: [id number] DOB: 03-07-1960  Situation: Referral received for Complex Care Management related to Mental/Behavioral Health diagnosis GAD/MDD/PTSD. I obtained verbal consent from Patient.  Visit completed with Patient  on the phone  Background:   Past Medical History:  Diagnosis Date   Allergic rhinitis    Anxiety    Bacterial vaginosis    Chronic post-traumatic stress disorder (PTSD)    Cocaine abuse (HCC)    Depression    ETOH abuse    GERD (gastroesophageal reflux disease)    Hyperlipidemia    Hypertension    off meds due to weight loss   Marijuana abuse    Uterine leiomyoma     Assessment: Patient Reported Symptoms:  Cognitive Cognitive Status: Able to follow simple commands, Alert and oriented to person, place, and time, Normal speech and language skills Cognitive/Intellectual Conditions Management [RPT]: None reported or documented in medical history or problem list   Health Maintenance Behaviors: Annual physical exam, Stress management Healing Pattern: Average Health Facilitated by: Stress management, Rest  Neurological Neurological Review of Symptoms: No symptoms reported Neurological Management Strategies: Adequate rest, Routine screening, Coping strategies Neurological Self-Management Outcome: 4 (good)  HEENT HEENT Symptoms Reported: No symptoms reported HEENT Management Strategies: Routine screening HEENT Self-Management Outcome: 4 (good)    Cardiovascular Cardiovascular Symptoms Reported: No symptoms reported Does patient have uncontrolled Hypertension?: Yes    Respiratory Respiratory Symptoms Reported: No symptoms reported    Musculoskeletal Musculoskelatal Symptoms Reviewed: Muscle pain, Back pain Musculoskeletal Management Strategies: Medication therapy, Adequate rest, Coping strategies Musculoskeletal Self-Management Outcome: 3 (uncertain)      Psychosocial Psychosocial Symptoms  Reported: No symptoms reported Behavioral Management Strategies: Adequate rest, Medication therapy, Coping strategies Behavioral Health Self-Management Outcome: 4 (good) Major Change/Loss/Stressor/Fears (CP): Traumatic event Techniques to Cope with Loss/Stress/Change: Diversional activities, Medication Quality of Family Relationships: involved Do you feel physically threatened by others?: No    12/02/2023    PHQ2-9 Depression Screening   Little interest or pleasure in doing things Not at all  Feeling down, depressed, or hopeless Several days  PHQ-2 - Total Score 1  Trouble falling or staying asleep, or sleeping too much More than half the days  Feeling tired or having little energy Several days  Poor appetite or overeating  Not at all  Feeling bad about yourself - or that you are a failure or have let yourself or your family down Not at all  Trouble concentrating on things, such as reading the newspaper or watching television Not at all  Moving or speaking so slowly that other people could have noticed.  Or the opposite - being so fidgety or restless that you have been moving around a lot more than usual Not at all  Thoughts that you would be better off dead, or hurting yourself in some way Not at all  PHQ2-9 Total Score 4  If you checked off any problems, how difficult have these problems made it for you to do your work, take care of things at home, or get along with other people Not difficult at all  Depression Interventions/Treatment Currently on Treatment, Medication, Counseling    There were no vitals filed for this visit.  Medications Reviewed Today     Reviewed by Merlynn Lyle CROME, LCSW (Social Worker) on 12/02/23 at 1155  Med List Status: <None>   Medication Order Taking? Sig Documenting Provider Last Dose Status Informant  amLODipine  (NORVASC ) 5 MG  tablet [id number]  Take 1 tablet (5 mg total) by mouth daily. Celestia Rosaline SQUIBB, NP  Active   esomeprazole  (NEXIUM ) 40 MG  capsule [id number]  Take 1 capsule (40 mg total) by mouth daily at 12 noon. May, Deanna J, NP  Active   famotidine  (PEPCID ) 20 MG tablet [id number]  Take 1 tablet (20 mg total) by mouth at bedtime. May, Deanna J, NP  Active   FLUoxetine  (PROZAC ) 20 MG capsule [id number]  Take 1 capsule (20 mg total) by mouth daily. Homer Shams, MD  Active   linaclotide  (LINZESS ) 72 MCG capsule [id number]  Take 1 capsule (72 mcg total) by mouth daily before breakfast. May, Deanna J, NP  Active   prazosin  (MINIPRESS ) 1 MG capsule [id number]  Take 1 capsule (1 mg total) by mouth at bedtime. Homer Shams, MD  Active   rosuvastatin  (CRESTOR ) 10 MG tablet [id number]  TAKE 4 TABLETS(40 MG) BY MOUTH DAILY Celestia Rosaline SQUIBB, NP  Active   sennosides-docusate sodium  (SENOKOT-S) 8.6-50 MG tablet [id number]  Take 1 tablet by mouth daily. Celestia Rosaline SQUIBB, NP  Active   XIIDRA 5 % SOLN [id number]  Apply 1 drop to eye 2 (two) times daily. [provider]  Active             Recommendation:   PCP Follow-up Continue Current Plan of Care  Follow Up Plan:   Closing From:  Complex Care Management  Lyle Rung, BSW, MSW, LCSW Licensed Clinical Social Worker [facility]   [email or web address] Direct Dial: [phone]

## 2023-12-05 ENCOUNTER — Inpatient Hospital Stay: Admission: RE | Admit: 2023-12-05 | Source: Ambulatory Visit
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
## 2023-12-06 ENCOUNTER — Encounter (HOSPITAL_COMMUNITY): Admitting: Student in an Organized Health Care Education/Training Program

## 2023-12-06 ENCOUNTER — Encounter (HOSPITAL_COMMUNITY): Payer: Self-pay

## 2023-12-06 ENCOUNTER — Telehealth (HOSPITAL_COMMUNITY): Payer: Self-pay | Admitting: Student in an Organized Health Care Education/Training Program

## 2023-12-06 ENCOUNTER — Encounter: Payer: Self-pay | Admitting: Family Medicine

## 2023-12-06 ENCOUNTER — Other Ambulatory Visit (INDEPENDENT_AMBULATORY_CARE_PROVIDER_SITE_OTHER): Payer: Self-pay | Admitting: Primary Care

## 2023-12-06 NOTE — Telephone Encounter (Signed)
 Patient did not show up for the appointment.  Appointment reminders were sent to patient's phone.  Attempted to call the patient at [phone] but received no response. Left a HIPAA compliant voicemail for patient to reschedule.    Dollie Bressi Carrin Carrero, MD PGY-3, [facility] Psychiat[facility]

## 2023-12-08 NOTE — Telephone Encounter (Signed)
 Why is pt taking 4 tablets?

## 2023-12-09 ENCOUNTER — Other Ambulatory Visit

## 2023-12-21 ENCOUNTER — Ambulatory Visit (HOSPITAL_COMMUNITY): Admitting: Mental Health

## 2023-12-22 ENCOUNTER — Encounter (HOSPITAL_COMMUNITY): Payer: Self-pay | Admitting: Student in an Organized Health Care Education/Training Program

## 2023-12-22 ENCOUNTER — Ambulatory Visit (INDEPENDENT_AMBULATORY_CARE_PROVIDER_SITE_OTHER): Admitting: Student in an Organized Health Care Education/Training Program

## 2023-12-22 DIAGNOSIS — F330 Major depressive disorder, recurrent, mild: Secondary | ICD-10-CM

## 2023-12-22 DIAGNOSIS — F4312 Post-traumatic stress disorder, chronic: Secondary | ICD-10-CM | POA: Diagnosis not present

## 2023-12-22 MED ORDER — MELATONIN 10 MG PO CAPS: 10.0000 mg | ORAL_CAPSULE | Freq: Every evening | ORAL | Status: AC

## 2023-12-22 MED ORDER — FLUOXETINE HCL 20 MG PO CAPS: 20.0000 mg | ORAL_CAPSULE | Freq: Every day | ORAL | 0 refills | Status: DC

## 2023-12-22 MED ORDER — PRAZOSIN HCL 1 MG PO CAPS: 1.0000 mg | ORAL_CAPSULE | Freq: Every day | ORAL | 0 refills | Status: DC
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
## 2023-12-27 ENCOUNTER — Telehealth (HOSPITAL_COMMUNITY): Payer: Self-pay

## 2023-12-27 DIAGNOSIS — G479 Sleep disorder, unspecified: Secondary | ICD-10-CM

## 2023-12-27 MED ORDER — TRAZODONE HCL 50 MG PO TABS: 25.0000 mg | ORAL_TABLET | Freq: Every day | ORAL | 0 refills | Status: DC

## 2023-12-27 NOTE — Telephone Encounter (Signed)
 Contacted and spoke with patient at her mobile number in her chart.  She reports significant difficulty with sleep since her appointment and has not found melatonin helpful.  She specifically has trouble maintaining sleep.  She has previously trialed trazodone  50 mg and found it sedating but was agreeable to taking 25 mg nightly.  Patient encouraged to reach out if sleep continues to be poor or if she has bothersome Aes.  - Start trazodone  25 mg (half 50 mg tablet), 60 tablets, 0 refills  Jleigh Striplin Carrin Carrero, MD PGY-3, [facility]

## 2023-12-27 NOTE — Telephone Encounter (Signed)
 Patient called in reporting poor sleep for the past two nights. Per pt the melatonin she is currently taking (OTC) is not working. Patient is requesting the prescriber to write a prescription for melatonin in hopes it would help. Per patient she didn't fall asleep until 5 am and had awful sleep.

## 2024-01-15 ENCOUNTER — Other Ambulatory Visit (HOSPITAL_COMMUNITY): Payer: Self-pay | Admitting: Student in an Organized Health Care Education/Training Program

## 2024-01-15 ENCOUNTER — Other Ambulatory Visit (INDEPENDENT_AMBULATORY_CARE_PROVIDER_SITE_OTHER): Payer: Self-pay | Admitting: Primary Care

## 2024-01-15 ENCOUNTER — Other Ambulatory Visit: Payer: Self-pay | Admitting: Gastroenterology

## 2024-01-15 DIAGNOSIS — K219 Gastro-esophageal reflux disease without esophagitis: Secondary | ICD-10-CM

## 2024-01-15 DIAGNOSIS — F330 Major depressive disorder, recurrent, mild: Secondary | ICD-10-CM

## 2024-01-15 DIAGNOSIS — F4312 Post-traumatic stress disorder, chronic: Secondary | ICD-10-CM

## 2024-01-17 ENCOUNTER — Ambulatory Visit (HOSPITAL_COMMUNITY): Admitting: Mental Health

## 2024-01-17 NOTE — Telephone Encounter (Signed)
 Requested Prescriptions  Pending Prescriptions Disp Refills   rosuvastatin  (CRESTOR ) 10 MG tablet [Pharmacy Med Name: ROSUVASTATIN  10MG  TABLETS] 90 tablet 0    Sig: TAKE 1 TABLET(10 MG) BY MOUTH DAILY     Cardiovascular:  Antilipid - Statins 2 Failed - 01/17/2024  3:51 PM      Failed - Lipid Panel in normal range within the last 12 months    Cholesterol, Total  Date Value Ref Range Status  08/12/2023 157 100 - 199 mg/dL Final   LDL Chol Calc (NIH)  Date Value Ref Range Status  08/12/2023 79 0 - 99 mg/dL Final   LDL Direct  Date Value Ref Range Status  08/02/2019 71 0 - 99 mg/dL Final   HDL  Date Value Ref Range Status  08/12/2023 67 >39 mg/dL Final   Triglycerides  Date Value Ref Range Status  08/12/2023 55 0 - 149 mg/dL Final         Passed - Cr in normal range and within 360 days    Creatinine, Ser  Date Value Ref Range Status  08/12/2023 0.89 0.57 - 1.00 mg/dL Final         Passed - Patient is not pregnant      Passed - Valid encounter within last 12 months    Recent Outpatient Visits           2 months ago Essential hypertension   [facility] Celestia Rosaline SQUIBB, NP   6 months ago Herpes zoster vaccination declined   [place] [facility] Celestia Rosaline SQUIBB, NP       Future Appointments             In 3 days Tharon Lung, MD [facility] Dept Of [place]. [facility], [facility]

## 2024-01-20 ENCOUNTER — Ambulatory Visit: Admitting: Family Medicine

## 2024-01-20 VITALS — BP 117/76 | HR 70 | Ht 66.0 in | Wt 183.0 lb

## 2024-01-20 DIAGNOSIS — Z0000 Encounter for general adult medical examination without abnormal findings: Secondary | ICD-10-CM

## 2024-01-20 DIAGNOSIS — R7303 Prediabetes: Secondary | ICD-10-CM

## 2024-01-20 DIAGNOSIS — M79652 Pain in left thigh: Secondary | ICD-10-CM

## 2024-01-20 DIAGNOSIS — Z1211 Encounter for screening for malignant neoplasm of colon: Secondary | ICD-10-CM | POA: Diagnosis not present

## 2024-01-20 DIAGNOSIS — M79651 Pain in right thigh: Secondary | ICD-10-CM

## 2024-01-20 LAB — POCT GLYCOSYLATED HEMOGLOBIN (HGB A1C): HbA1c, POC (prediabetic range): 6.3 % (ref 5.7–6.4)

## 2024-01-20 MED ORDER — KETOROLAC TROMETHAMINE 30 MG/ML IJ SOLN
30.0000 mg | Freq: Once | INTRAMUSCULAR | Status: AC
  Administered 2024-01-20: 30 mg via INTRAMUSCULAR

## 2024-01-20 NOTE — Progress Notes (Signed)
 SUBJECTIVE:   Chief compliant/HPI: annual examination  Tonya Mckenzie is a 63 y.o. who presents today for an annual exam.   Discussed the use of AI scribe software for clinical note transcription with the patient, who gave verbal consent to proceed.  History of Present Illness Tonya Mckenzie is a 63 year old female who presents with leg pain and recurrent skin growths.  Lower extremity pain and muscle cramps - Pain localized to the back of both legs for over three years - Most severe in the morning; stretching before getting out of bed provides temporary relief - Pain worsens with walking - Injections in the back have significantly reduced pain, but symptoms persist - Attends [employer] four times weekly and recently walked at the park without significant relief - Occasional muscle cramps - Hydration maintained with Gatorade - BC powder taken almost once daily in the morning for pain relief  Cutaneous lesions - Recurrent skin growth that is filed down but regrows on her medial L ankle - Growth does not itch or hurt - New skin lesion present also on anterior L shin; does not itch, burn, or hurt - No scratching of the new lesion - Attempted to peel skin off to restore regular color, but lesion persists  Weight gain - Progressive weight gain despite regular physical activity and low food intake - Weight gain is concerning to her  Palpitations - Fluttering sensation in the chest - Onset after starting Prozac  prescribed by Behavioral Health  OBJECTIVE:   BP 117/76 (BP Location: Left Arm, Patient Position: Sitting, Cuff Size: Normal)   Pulse 70   Ht 5' 6 (1.676 m)   Wt 183 lb (83 kg)   SpO2 98%   BMI 29.54 kg/m   General: Alert and oriented, in NAD Skin: Warm, dry, and intact without lesions HEENT: NCAT, EOM grossly normal, midline nasal septum Respiratory: Breathing and speaking comfortably on RA Extremities: Moves all extremities grossly equally, no TTP of  BLE Neurological: No gross focal deficit, strength and sensation intact in BLE, gait normal Psychiatric: Appropriate mood and affect   ASSESSMENT/PLAN:   Assessment & Plan Lower back and leg pain/cramps with radiculopathy Chronic pain with radicular symptoms, possibly nerve-related. Previous treatments provided temporary relief. Differential includes electrolyte imbalance, thyroid dysfunction, and vitamin D  deficiency. - Ordered lab tests for electrolytes, thyroid function, and vitamin D  levels to rule out other causes of cramping - Referred to physical therapy for lower back and thighs. - Administered Toradol  injection for pain relief today at patient request - Advised against BC powder with Toradol . - Encouraged follow-up with orthopedics for further back injection approval.  Cutaneous lesions, unspecified etiology Asymptomatic. Lesion on ankle likely dermatofibroma. Lesion on anterior shin more likely post-inflammatory hyperpigmentation. Advised of benign characteristics. - Patient to present at later time for biopsy given her preference  General Health Maintenance Colonoscopy due; pneumonia vaccine discussed but deferred. - Ordered colonoscopy referral. - Discussed pneumonia vaccine; she opted to wait.  Annual Examination  See AVS for age appropriate recommendations  PHQ score 2, reviewed.  BP reviewed and at goal.   Considered the following items based upon USPSTF recommendations: Diabetes screening: ordered - 6.3, prediabetes, continued lifestyle changes discussed and handout given Lipid panel (nonfasting or fasting) discussed based upon AHA recommendations and recently completed and repeat not yet indicated.  Consider repeat every 4-6 years.   Cancer Screening Discussion  Cervical cancer screening: not indicated given history of hysterectomy with prior normal cytology.  Breast cancer screening: recently completed and repeat not yet indicated - normal Lung cancer  screening:not indicated as does not meet criteria.  See documentation below regarding indications/risks/benefits.  Colorectal cancer screening: discussed, colonoscopy ordered.   Follow up in 2-4 weeks for follow up and possible biopsy of skin lesion.   Stuart Redo, MD [facility]

## 2024-01-20 NOTE — Patient Instructions (Addendum)
 I have ordered labs and a colonoscopy for you. We also gave you toradol  and more physical therapy. DO NOT take goody powder or ibuprofen  today after the toradol . Come back in 2-4 weeks to see if this is improving and for potential biopsy of the skin lesion. If it is not bothering you, though, I would not mess with it too much!

## 2024-01-25 ENCOUNTER — Ambulatory Visit: Admitting: Family Medicine

## 2024-01-25 ENCOUNTER — Encounter: Payer: Self-pay | Admitting: Family Medicine

## 2024-01-25 VITALS — BP 115/80 | HR 74 | Ht 66.0 in | Wt 182.8 lb

## 2024-01-25 DIAGNOSIS — L989 Disorder of the skin and subcutaneous tissue, unspecified: Secondary | ICD-10-CM

## 2024-01-25 DIAGNOSIS — M79651 Pain in right thigh: Secondary | ICD-10-CM

## 2024-01-25 DIAGNOSIS — R7303 Prediabetes: Secondary | ICD-10-CM

## 2024-01-25 DIAGNOSIS — M79652 Pain in left thigh: Secondary | ICD-10-CM

## 2024-01-25 MED ORDER — MELOXICAM 15 MG PO TABS: 15.0000 mg | ORAL_TABLET | Freq: Every day | ORAL | 0 refills | Status: DC

## 2024-01-25 NOTE — Patient Instructions (Addendum)
 Please come back when able, but be sure to ask if the liquid nitrogen is back in stock. I sincerely apologize this happened today!

## 2024-01-25 NOTE — Assessment & Plan Note (Signed)
 Likely radiated from the back given improvement with injections. I have sent a referral to orthopedics for further evaluation. Meloxicam  sent for 14 days to ease the pain until PT.

## 2024-01-25 NOTE — Progress Notes (Signed)
    SUBJECTIVE:   CHIEF COMPLAINT / HPI:   Lesion biopsy Would like to have the lesion of the ankle biopsied today. Lesions continue to be asymptomatic, but she would like for them to be removed.  Pain in thighs, back Toradol  did not help much. She has been in contact with PT and has an upcoming appointment.  OBJECTIVE:   BP 115/80   Pulse 74   Ht 5' 6 (1.676 m)   Wt 182 lb 12.8 oz (82.9 kg)   SpO2 100%   BMI 29.50 kg/m   General: Alert and oriented, in NAD Skin: Warm, dry, and intact; lesions on left leg/ankle similar to prior note Neuro: No gross focal deficit, gait normal, moving all extremities grossly equally  ASSESSMENT/PLAN:   Assessment & Plan Skin lesions No cryotherapy in office today. Would like to hold off on biopsy at this time. Again, feel the lesions are benign. She will return once we have cryotherapy back in stock. Pain in both thighs Likely radiated from the back given improvement with injections. I have sent a referral to orthopedics for further evaluation. Meloxicam  sent for 14 days to ease the pain until PT.   Stuart Redo, MD [facility]

## 2024-01-28 ENCOUNTER — Other Ambulatory Visit: Payer: Self-pay | Admitting: Gastroenterology

## 2024-01-28 DIAGNOSIS — K219 Gastro-esophageal reflux disease without esophagitis: Secondary | ICD-10-CM

## 2024-01-30 ENCOUNTER — Encounter: Payer: Self-pay | Admitting: Family Medicine

## 2024-02-03 ENCOUNTER — Telehealth: Payer: Self-pay

## 2024-02-03 MED ORDER — EZETIMIBE 10 MG PO TABS: 10.0000 mg | ORAL_TABLET | Freq: Every day | ORAL | 3 refills | Status: AC

## 2024-02-03 NOTE — Telephone Encounter (Signed)
 Patient calls nurse line rquesting refill on Zetia  10 mg prescription.   Per chart review, this was discontinued.   Will forward to PCP for further advisement on refill.   If appropriate, patient would like refill sent to [facility].   Chiquita JAYSON English, RN

## 2024-02-03 NOTE — Telephone Encounter (Signed)
 Refilled Zetia . Unsure why this was discontinued in the past. Lipid panel with LDL lowest in recent check at 79 while on Crestor  and Zetia  presumably. Continue therapy. Can consider increasing statin dose at next visit to target LDL <70.

## 2024-02-06 ENCOUNTER — Ambulatory Visit: Attending: Family Medicine

## 2024-02-16 ENCOUNTER — Encounter (HOSPITAL_COMMUNITY): Admitting: Student in an Organized Health Care Education/Training Program

## 2024-02-17 ENCOUNTER — Ambulatory Visit (HOSPITAL_COMMUNITY): Admitting: Mental Health

## 2024-02-19 ENCOUNTER — Other Ambulatory Visit: Payer: Self-pay | Admitting: Gastroenterology

## 2024-02-20 ENCOUNTER — Ambulatory Visit: Admitting: Physical Medicine and Rehabilitation

## 2024-02-22 ENCOUNTER — Other Ambulatory Visit (HOSPITAL_COMMUNITY): Payer: Self-pay | Admitting: Student in an Organized Health Care Education/Training Program

## 2024-02-22 DIAGNOSIS — F4312 Post-traumatic stress disorder, chronic: Secondary | ICD-10-CM

## 2024-02-27 ENCOUNTER — Encounter: Payer: Self-pay | Admitting: Family Medicine

## 2024-02-27 ENCOUNTER — Ambulatory Visit: Payer: Self-pay | Admitting: Family Medicine

## 2024-02-27 VITALS — BP 126/83 | HR 66 | Ht 66.0 in | Wt 182.8 lb

## 2024-02-27 DIAGNOSIS — L989 Disorder of the skin and subcutaneous tissue, unspecified: Secondary | ICD-10-CM | POA: Diagnosis present

## 2024-02-27 DIAGNOSIS — M79652 Pain in left thigh: Secondary | ICD-10-CM | POA: Diagnosis not present

## 2024-02-27 DIAGNOSIS — Z1211 Encounter for screening for malignant neoplasm of colon: Secondary | ICD-10-CM | POA: Diagnosis not present

## 2024-02-27 DIAGNOSIS — M79651 Pain in right thigh: Secondary | ICD-10-CM | POA: Diagnosis not present

## 2024-02-27 DIAGNOSIS — R350 Frequency of micturition: Secondary | ICD-10-CM

## 2024-02-27 NOTE — Progress Notes (Signed)
" ° ° °  SUBJECTIVE:   CHIEF COMPLAINT / HPI:   Bilateral thigh pain Has not received a call from orthopedics or PT yet. Pain continues. She uses her exercise bike at home and exercises at [facility] which helps the pain, especially after resting overnight.  Skin lesions Would like to proceed with cryo today.  UTI symptoms Urinating more frequently and with suprapubic tenderness. No true dysuria. Thinks she may have a UTI. No flank tenderness. No fevers.  OBJECTIVE:   BP 126/83   Pulse 66   Ht 5' 6 (1.676 m)   Wt 182 lb 12.8 oz (82.9 kg)   SpO2 100%   BMI 29.50 kg/m   General: Alert and oriented, in NAD Skin: Warm, dry, and intact; subcentimeter pearly soft lesions of the left medial ankle, no TTP HEENT: NCAT, EOM grossly normal, midline nasal septum Respiratory: Breathing and speaking comfortably on RA Extremities: Moves all extremities grossly equally Neurological: No gross focal deficit Psychiatric: Appropriate mood and affect     ASSESSMENT/PLAN:   Assessment & Plan Skin lesions Diagnosis: Benign skin lesion, likely dermatofibroma vs wart Procedure: Cryotherapy Location: L medial ankle  After discussion of the risks, benefits, and alternative therapies available, the patient elected to proceed. After obtaining written informed consent, the patient's identity, procedure, and site were verified during a time out prior to proceeding procedure. The 3 lesions on the L medial ankle were treated using liquid nitrogen spray gun for 2-3 seconds per cycle, 3 cycles total, given the delicate location of the lesions. The patient tolerated the procedure well and there were no immediate complications.  Patient was provided aftercare handout and advised to return if lesion(s) did not fully resolved.   Urinary frequency UA collected though sample not able to be analyzed. I have sent a message to her asking her to return for a repeat sample, will await her response. Patient comfortable  on exam today. Differential includes UTI vs overactive bladder vs interstitial cystitis. Pain in both thighs Continues though improved with continued activity. Gave number to contact [facility] to make an appointment. Encounter for screening colonoscopy Gave number to call [facility] to schedule.   Stuart Redo, MD [facility]  "

## 2024-02-27 NOTE — Assessment & Plan Note (Signed)
 Continues though improved with continued activity. Gave number to contact [facility] to make an appointment.

## 2024-02-27 NOTE — Patient Instructions (Addendum)
 [facility] [address]  [phone]  [facility] [address] [place] [address], [place], [place] [address] Phone: [phone]  We cryotherapied the lesions today. I have given instructions for care after the procedure.  We collected a urine sample today to check for a UTI. Let me know if this is worsening before we get the results back.

## 2024-02-28 ENCOUNTER — Ambulatory Visit (HOSPITAL_COMMUNITY): Admitting: Mental Health

## 2024-03-05 ENCOUNTER — Ambulatory Visit: Admitting: Physical Medicine and Rehabilitation

## 2024-03-12 ENCOUNTER — Ambulatory Visit (HOSPITAL_COMMUNITY)

## 2024-03-12 ENCOUNTER — Other Ambulatory Visit: Payer: Self-pay | Admitting: Family Medicine

## 2024-03-12 ENCOUNTER — Encounter (HOSPITAL_COMMUNITY): Payer: Self-pay

## 2024-03-12 ENCOUNTER — Other Ambulatory Visit (INDEPENDENT_AMBULATORY_CARE_PROVIDER_SITE_OTHER): Payer: Self-pay | Admitting: Primary Care

## 2024-03-12 ENCOUNTER — Telehealth (HOSPITAL_COMMUNITY): Payer: Self-pay | Admitting: Student in an Organized Health Care Education/Training Program

## 2024-03-12 ENCOUNTER — Other Ambulatory Visit (HOSPITAL_COMMUNITY): Payer: Self-pay | Admitting: Student in an Organized Health Care Education/Training Program

## 2024-03-12 DIAGNOSIS — F4312 Post-traumatic stress disorder, chronic: Secondary | ICD-10-CM

## 2024-03-12 DIAGNOSIS — G479 Sleep disorder, unspecified: Secondary | ICD-10-CM

## 2024-03-12 DIAGNOSIS — F330 Major depressive disorder, recurrent, mild: Secondary | ICD-10-CM

## 2024-03-12 DIAGNOSIS — F4310 Post-traumatic stress disorder, unspecified: Secondary | ICD-10-CM

## 2024-03-12 NOTE — Telephone Encounter (Signed)
 Patient came in for a visit with therapist. Her medication (for nightmares and PTSD) does not seem to be working. Prozac  does seem to working, but the other med is not. Please advise. [phone] is the best callback number for her. Please advise. Thank you.
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
## 2024-03-13 MED ORDER — PRAZOSIN HCL 2 MG PO CAPS: 2.0000 mg | ORAL_CAPSULE | Freq: Every day | ORAL | 0 refills | Status: AC

## 2024-03-13 MED ORDER — TRAZODONE HCL 50 MG PO TABS: 25.0000 mg | ORAL_TABLET | Freq: Every day | ORAL | 0 refills | Status: AC

## 2024-03-13 NOTE — Telephone Encounter (Signed)
 Requested medication (s) are due for refill today: Yes  Requested medication (s) are on the active medication list: Yes  Last refill:    Future visit scheduled: No  Notes to clinic:  Is pt. Still seen at the practice?    Requested Prescriptions  Pending Prescriptions Disp Refills   amLODipine  (NORVASC ) 5 MG tablet [Pharmacy Med Name: AMLODIPINE  BESYLATE 5MG  TABLETS] 90 tablet 1    Sig: TAKE 1 TABLET(5 MG) BY MOUTH DAILY     Cardiovascular: Calcium  Channel Blockers 2 Passed - 03/13/2024  2:53 PM      Passed - Last BP in normal range    BP Readings from Last 1 Encounters:  02/27/24 126/83         Passed - Last Heart Rate in normal range    Pulse Readings from Last 1 Encounters:  02/27/24 66         Passed - Valid encounter within last 6 months    Recent Outpatient Visits           4 months ago Essential hypertension   [place] [facility] Celestia Rosaline SQUIBB, NP   8 months ago Herpes zoster vaccination declined   [place] [facility] Celestia Rosaline SQUIBB, NP

## 2024-03-13 NOTE — Telephone Encounter (Signed)
 Contacted and spoke with patient reports her current dose of prazosin  is no longer working.  She has been told by her partner that she is waking up during the night screaming.  She is amenable to increasing the dose of prazosin  from 1 mg to 2 mg nightly.  Patient encouraged to use 1 mg tablets that she has at home and to take 2 tablets nightly until refill is ready.  Patient was made aware of postural hypotension secondary to titration of prazosin , patient reassuringly has had no side effects on current dose of prazosin .  Patient is happy with current dose of Prozac  and trazodone .  Patient is scheduled to follow-up with me on 04/05/2024.  Estes Lehner Carrin Carrero, MD PGY-3, [facility]

## 2024-03-14 ENCOUNTER — Encounter: Payer: Self-pay | Admitting: Gastroenterology

## 2024-03-14 ENCOUNTER — Other Ambulatory Visit: Payer: Self-pay

## 2024-03-14 MED ORDER — ROSUVASTATIN CALCIUM 20 MG PO TABS: 20.0000 mg | ORAL_TABLET | Freq: Every day | ORAL | 0 refills | Status: AC

## 2024-03-19 ENCOUNTER — Ambulatory Visit: Admitting: Physical Medicine and Rehabilitation

## 2024-03-26 ENCOUNTER — Ambulatory Visit (HOSPITAL_COMMUNITY)

## 2024-03-27 ENCOUNTER — Ambulatory Visit: Admitting: Physical Medicine and Rehabilitation

## 2024-03-31 ENCOUNTER — Other Ambulatory Visit: Payer: Self-pay | Admitting: Family Medicine

## 2024-04-04 ENCOUNTER — Ambulatory Visit (INDEPENDENT_AMBULATORY_CARE_PROVIDER_SITE_OTHER)

## 2024-04-04 DIAGNOSIS — F4310 Post-traumatic stress disorder, unspecified: Secondary | ICD-10-CM | POA: Diagnosis not present
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
## 2024-04-05 ENCOUNTER — Encounter (HOSPITAL_COMMUNITY): Admitting: Student in an Organized Health Care Education/Training Program

## 2024-04-06 ENCOUNTER — Other Ambulatory Visit: Payer: Self-pay | Admitting: Gastroenterology

## 2024-04-06 DIAGNOSIS — K219 Gastro-esophageal reflux disease without esophagitis: Secondary | ICD-10-CM

## 2024-04-10 ENCOUNTER — Ambulatory Visit: Admitting: Physical Medicine and Rehabilitation

## 2024-04-11 ENCOUNTER — Encounter (HOSPITAL_COMMUNITY): Payer: Self-pay

## 2024-04-11 ENCOUNTER — Ambulatory Visit (HOSPITAL_COMMUNITY): Admitting: Mental Health

## 2024-04-25 ENCOUNTER — Ambulatory Visit (HOSPITAL_COMMUNITY)

## 2024-05-03 ENCOUNTER — Encounter (HOSPITAL_COMMUNITY): Admitting: Student in an Organized Health Care Education/Training Program

## 2024-05-10 ENCOUNTER — Encounter

## 2024-05-24 ENCOUNTER — Encounter: Admitting: Gastroenterology
